# Patient Record
Sex: Male | Born: 1945 | Race: White | Hispanic: No | Marital: Married | Smoking: Current every day smoker
Health system: Southern US, Community
[De-identification: ages and names within clinical notes are randomized; demographics above are authoritative.]

## PROBLEM LIST (undated history)

## (undated) DIAGNOSIS — Z87440 Personal history of urinary (tract) infections: Secondary | ICD-10-CM

## (undated) DIAGNOSIS — R399 Unspecified symptoms and signs involving the genitourinary system: Secondary | ICD-10-CM

## (undated) DIAGNOSIS — F0390 Unspecified dementia without behavioral disturbance: Secondary | ICD-10-CM

## (undated) DIAGNOSIS — Z8673 Personal history of transient ischemic attack (TIA), and cerebral infarction without residual deficits: Secondary | ICD-10-CM

## (undated) DIAGNOSIS — R251 Tremor, unspecified: Secondary | ICD-10-CM

## (undated) DIAGNOSIS — K5909 Other constipation: Secondary | ICD-10-CM

## (undated) DIAGNOSIS — F419 Anxiety disorder, unspecified: Secondary | ICD-10-CM

## (undated) DIAGNOSIS — N210 Calculus in bladder: Secondary | ICD-10-CM

## (undated) DIAGNOSIS — N400 Enlarged prostate without lower urinary tract symptoms: Secondary | ICD-10-CM

## (undated) DIAGNOSIS — Z87448 Personal history of other diseases of urinary system: Secondary | ICD-10-CM

## (undated) DIAGNOSIS — N529 Male erectile dysfunction, unspecified: Secondary | ICD-10-CM

## (undated) DIAGNOSIS — R531 Weakness: Secondary | ICD-10-CM

## (undated) DIAGNOSIS — E785 Hyperlipidemia, unspecified: Secondary | ICD-10-CM

## (undated) DIAGNOSIS — G238 Other specified degenerative diseases of basal ganglia: Secondary | ICD-10-CM

## (undated) DIAGNOSIS — F32A Depression, unspecified: Secondary | ICD-10-CM

## (undated) DIAGNOSIS — F329 Major depressive disorder, single episode, unspecified: Secondary | ICD-10-CM

## (undated) DIAGNOSIS — F319 Bipolar disorder, unspecified: Secondary | ICD-10-CM

## (undated) DIAGNOSIS — Z85828 Personal history of other malignant neoplasm of skin: Secondary | ICD-10-CM

## (undated) DIAGNOSIS — F209 Schizophrenia, unspecified: Secondary | ICD-10-CM

## (undated) DIAGNOSIS — E538 Deficiency of other specified B group vitamins: Secondary | ICD-10-CM

## (undated) DIAGNOSIS — I10 Essential (primary) hypertension: Secondary | ICD-10-CM

## (undated) DIAGNOSIS — R972 Elevated prostate specific antigen [PSA]: Secondary | ICD-10-CM

## (undated) HISTORY — DX: Tremor, unspecified: R25.1

## (undated) HISTORY — DX: Major depressive disorder, single episode, unspecified: F32.9

## (undated) HISTORY — DX: Calculus in bladder: N21.0

## (undated) HISTORY — DX: Benign prostatic hyperplasia without lower urinary tract symptoms: N40.0

## (undated) HISTORY — DX: Deficiency of other specified B group vitamins: E53.8

## (undated) HISTORY — PX: CARDIOVASCULAR STRESS TEST: SHX262

## (undated) HISTORY — DX: Bipolar disorder, unspecified: F31.9

## (undated) HISTORY — DX: Depression, unspecified: F32.A

---

## 1999-10-01 ENCOUNTER — Emergency Department (HOSPITAL_COMMUNITY): Admission: EM | Admit: 1999-10-01 | Discharge: 1999-10-01 | Payer: Self-pay | Admitting: Emergency Medicine

## 2000-09-25 ENCOUNTER — Emergency Department (HOSPITAL_COMMUNITY): Admission: EM | Admit: 2000-09-25 | Discharge: 2000-09-26 | Payer: Self-pay

## 2000-09-29 ENCOUNTER — Emergency Department (HOSPITAL_COMMUNITY): Admission: EM | Admit: 2000-09-29 | Discharge: 2000-09-29 | Payer: Self-pay | Admitting: Emergency Medicine

## 2000-10-10 ENCOUNTER — Encounter: Payer: Self-pay | Admitting: Gastroenterology

## 2000-10-10 ENCOUNTER — Ambulatory Visit (HOSPITAL_COMMUNITY): Admission: RE | Admit: 2000-10-10 | Discharge: 2000-10-10 | Payer: Self-pay | Admitting: Gastroenterology

## 2000-10-10 ENCOUNTER — Encounter (INDEPENDENT_AMBULATORY_CARE_PROVIDER_SITE_OTHER): Payer: Self-pay | Admitting: Specialist

## 2000-11-01 ENCOUNTER — Encounter: Admission: RE | Admit: 2000-11-01 | Discharge: 2000-11-01 | Payer: Self-pay | Admitting: General Surgery

## 2000-11-01 ENCOUNTER — Encounter: Payer: Self-pay | Admitting: General Surgery

## 2000-11-02 ENCOUNTER — Ambulatory Visit (HOSPITAL_BASED_OUTPATIENT_CLINIC_OR_DEPARTMENT_OTHER): Admission: RE | Admit: 2000-11-02 | Discharge: 2000-11-02 | Payer: Self-pay | Admitting: General Surgery

## 2000-11-02 ENCOUNTER — Encounter (INDEPENDENT_AMBULATORY_CARE_PROVIDER_SITE_OTHER): Payer: Self-pay | Admitting: *Deleted

## 2000-11-02 HISTORY — PX: OTHER SURGICAL HISTORY: SHX169

## 2000-12-31 ENCOUNTER — Emergency Department (HOSPITAL_COMMUNITY): Admission: EM | Admit: 2000-12-31 | Discharge: 2000-12-31 | Payer: Self-pay

## 2001-10-05 ENCOUNTER — Emergency Department (HOSPITAL_COMMUNITY): Admission: EM | Admit: 2001-10-05 | Discharge: 2001-10-05 | Payer: Self-pay

## 2001-11-02 ENCOUNTER — Encounter: Payer: Self-pay | Admitting: Emergency Medicine

## 2001-11-02 ENCOUNTER — Inpatient Hospital Stay (HOSPITAL_COMMUNITY): Admission: EM | Admit: 2001-11-02 | Discharge: 2001-11-05 | Payer: Self-pay | Admitting: Emergency Medicine

## 2001-11-03 ENCOUNTER — Encounter: Payer: Self-pay | Admitting: Internal Medicine

## 2001-12-11 ENCOUNTER — Emergency Department (HOSPITAL_COMMUNITY): Admission: EM | Admit: 2001-12-11 | Discharge: 2001-12-11 | Payer: Self-pay | Admitting: Emergency Medicine

## 2004-10-20 ENCOUNTER — Emergency Department (HOSPITAL_COMMUNITY): Admission: EM | Admit: 2004-10-20 | Discharge: 2004-10-21 | Payer: Self-pay | Admitting: Emergency Medicine

## 2005-01-10 ENCOUNTER — Emergency Department (HOSPITAL_COMMUNITY): Admission: EM | Admit: 2005-01-10 | Discharge: 2005-01-10 | Payer: Self-pay | Admitting: Emergency Medicine

## 2007-08-29 ENCOUNTER — Emergency Department (HOSPITAL_COMMUNITY): Admission: EM | Admit: 2007-08-29 | Discharge: 2007-08-29 | Payer: Self-pay | Admitting: Emergency Medicine

## 2007-10-22 ENCOUNTER — Emergency Department (HOSPITAL_COMMUNITY): Admission: EM | Admit: 2007-10-22 | Discharge: 2007-10-22 | Payer: Self-pay | Admitting: Emergency Medicine

## 2008-02-18 ENCOUNTER — Emergency Department (HOSPITAL_COMMUNITY): Admission: EM | Admit: 2008-02-18 | Discharge: 2008-02-19 | Payer: Self-pay | Admitting: Emergency Medicine

## 2008-12-24 ENCOUNTER — Emergency Department (HOSPITAL_BASED_OUTPATIENT_CLINIC_OR_DEPARTMENT_OTHER): Admission: EM | Admit: 2008-12-24 | Discharge: 2008-12-24 | Payer: Self-pay | Admitting: Emergency Medicine

## 2009-02-03 ENCOUNTER — Encounter: Admission: RE | Admit: 2009-02-03 | Discharge: 2009-02-03 | Payer: Self-pay | Admitting: Internal Medicine

## 2009-03-05 ENCOUNTER — Emergency Department (HOSPITAL_COMMUNITY): Admission: EM | Admit: 2009-03-05 | Discharge: 2009-03-05 | Payer: Self-pay | Admitting: *Deleted

## 2009-03-18 ENCOUNTER — Encounter: Admission: RE | Admit: 2009-03-18 | Discharge: 2009-03-18 | Payer: Self-pay | Admitting: Family Medicine

## 2009-07-01 ENCOUNTER — Emergency Department (HOSPITAL_COMMUNITY): Admission: EM | Admit: 2009-07-01 | Discharge: 2009-07-01 | Payer: Self-pay | Admitting: Emergency Medicine

## 2009-12-06 ENCOUNTER — Observation Stay (HOSPITAL_COMMUNITY): Admission: EM | Admit: 2009-12-06 | Discharge: 2009-12-09 | Payer: Self-pay | Admitting: Emergency Medicine

## 2009-12-07 ENCOUNTER — Encounter (INDEPENDENT_AMBULATORY_CARE_PROVIDER_SITE_OTHER): Payer: Self-pay | Admitting: Internal Medicine

## 2009-12-08 ENCOUNTER — Encounter (INDEPENDENT_AMBULATORY_CARE_PROVIDER_SITE_OTHER): Payer: Self-pay | Admitting: Internal Medicine

## 2010-03-24 ENCOUNTER — Inpatient Hospital Stay (HOSPITAL_COMMUNITY): Admission: EM | Admit: 2010-03-24 | Discharge: 2010-03-29 | Payer: Self-pay | Admitting: Internal Medicine

## 2010-03-24 ENCOUNTER — Encounter: Payer: Self-pay | Admitting: Emergency Medicine

## 2010-03-24 ENCOUNTER — Ambulatory Visit: Payer: Self-pay | Admitting: Diagnostic Radiology

## 2010-03-24 ENCOUNTER — Ambulatory Visit: Payer: Self-pay | Admitting: Cardiology

## 2010-03-25 ENCOUNTER — Encounter (INDEPENDENT_AMBULATORY_CARE_PROVIDER_SITE_OTHER): Payer: Self-pay | Admitting: Internal Medicine

## 2010-10-20 ENCOUNTER — Ambulatory Visit: Payer: Self-pay | Admitting: Diagnostic Radiology

## 2010-10-20 ENCOUNTER — Emergency Department (HOSPITAL_BASED_OUTPATIENT_CLINIC_OR_DEPARTMENT_OTHER): Admission: EM | Admit: 2010-10-20 | Discharge: 2010-10-20 | Payer: Self-pay | Admitting: Emergency Medicine

## 2010-11-07 ENCOUNTER — Emergency Department (HOSPITAL_BASED_OUTPATIENT_CLINIC_OR_DEPARTMENT_OTHER)
Admission: EM | Admit: 2010-11-07 | Discharge: 2010-11-07 | Payer: Self-pay | Source: Home / Self Care | Admitting: Emergency Medicine

## 2010-12-04 HISTORY — PX: OTHER SURGICAL HISTORY: SHX169

## 2011-02-14 ENCOUNTER — Emergency Department (HOSPITAL_BASED_OUTPATIENT_CLINIC_OR_DEPARTMENT_OTHER)
Admission: EM | Admit: 2011-02-14 | Discharge: 2011-02-14 | Disposition: A | Discharge status: Home / Self Care | Payer: MEDICARE | Attending: Emergency Medicine | Admitting: Emergency Medicine

## 2011-02-14 DIAGNOSIS — R112 Nausea with vomiting, unspecified: Secondary | ICD-10-CM | POA: Insufficient documentation

## 2011-02-14 DIAGNOSIS — F17200 Nicotine dependence, unspecified, uncomplicated: Secondary | ICD-10-CM | POA: Insufficient documentation

## 2011-02-14 DIAGNOSIS — R197 Diarrhea, unspecified: Secondary | ICD-10-CM | POA: Insufficient documentation

## 2011-02-14 DIAGNOSIS — E860 Dehydration: Secondary | ICD-10-CM | POA: Insufficient documentation

## 2011-02-14 DIAGNOSIS — F319 Bipolar disorder, unspecified: Secondary | ICD-10-CM | POA: Insufficient documentation

## 2011-02-14 DIAGNOSIS — Z79899 Other long term (current) drug therapy: Secondary | ICD-10-CM | POA: Insufficient documentation

## 2011-02-14 DIAGNOSIS — I10 Essential (primary) hypertension: Secondary | ICD-10-CM | POA: Insufficient documentation

## 2011-02-14 DIAGNOSIS — N390 Urinary tract infection, site not specified: Secondary | ICD-10-CM | POA: Insufficient documentation

## 2011-02-14 DIAGNOSIS — K5289 Other specified noninfective gastroenteritis and colitis: Secondary | ICD-10-CM | POA: Insufficient documentation

## 2011-02-14 LAB — DIFFERENTIAL
Basophils Absolute: 0.3 10*3/uL — ABNORMAL HIGH (ref 0.0–0.1)
Basophils Relative: 1 % (ref 0–1)
Basophils Relative: 3 % — ABNORMAL HIGH (ref 0–1)
Eosinophils Absolute: 0 10*3/uL (ref 0.0–0.7)
Eosinophils Relative: 1 % (ref 0–5)
Eosinophils Relative: 1 % (ref 0–5)
Eosinophils Relative: 1 % (ref 0–5)
Lymphocytes Relative: 4 % — ABNORMAL LOW (ref 12–46)
Lymphs Abs: 0.6 10*3/uL — ABNORMAL LOW (ref 0.7–4.0)
Lymphs Abs: 1.1 10*3/uL (ref 0.7–4.0)
Monocytes Absolute: 0.8 10*3/uL (ref 0.1–1.0)
Monocytes Absolute: 0.5 10*3/uL (ref 0.1–1.0)
Monocytes Relative: 4 % (ref 3–12)
Monocytes Relative: 4 % (ref 3–12)
Neutro Abs: 14.4 10*3/uL — ABNORMAL HIGH (ref 1.7–7.7)
Neutro Abs: 8.6 10*3/uL — ABNORMAL HIGH (ref 1.7–7.7)
Neutrophils Relative %: 82 % — ABNORMAL HIGH (ref 43–77)

## 2011-02-14 LAB — COMPREHENSIVE METABOLIC PANEL
ALT: 13 U/L (ref 0–53)
ALT: 19 U/L (ref 0–53)
AST: 24 U/L (ref 0–37)
AST: 27 U/L (ref 0–37)
Albumin: 4.5 g/dL (ref 3.5–5.2)
Alkaline Phosphatase: 121 U/L — ABNORMAL HIGH (ref 39–117)
Alkaline Phosphatase: 128 U/L — ABNORMAL HIGH (ref 39–117)
CO2: 25 mEq/L (ref 19–32)
Chloride: 106 mEq/L (ref 96–112)
GFR calc Af Amer: >60 mL/min (ref >60)
GFR calc Af Amer: >60 mL/min (ref >60)
GFR calc non Af Amer: >60 mL/min (ref >60)
Glucose, Bld: 118 mg/dL — ABNORMAL HIGH (ref 70–99)
Potassium: 4.5 mEq/L (ref 3.5–5.1)
Potassium: 4.5 mEq/L (ref 3.5–5.1)
Sodium: 142 mEq/L (ref 135–145)
Sodium: 144 mEq/L (ref 135–145)
Total Bilirubin: 0.9 mg/dL (ref 0.3–1.2)
Total Protein: 7.7 g/dL (ref 6.0–8.3)
Total Protein: 7.8 g/dL (ref 6.0–8.3)

## 2011-02-14 LAB — URINALYSIS, ROUTINE W REFLEX MICROSCOPIC
Bilirubin Urine: NEGATIVE
Bilirubin Urine: NEGATIVE
Glucose, UA: 100 mg/dL — AB
Glucose, UA: NEGATIVE mg/dL
Glucose, UA: NEGATIVE mg/dL
Hgb urine dipstick: NEGATIVE
Ketones, ur: 15 mg/dL — AB
Leukocytes, UA: NEGATIVE
Nitrite: NEGATIVE
Protein, ur: 30 mg/dL — AB
Specific Gravity, Urine: 1.02 (ref 1.005–1.030)
Specific Gravity, Urine: 1.015 (ref 1.005–1.030)
Urobilinogen, UA: 0.2 mg/dL (ref 0.0–1.0)
pH: 6 (ref 5.0–8.0)
pH: 6.5 (ref 5.0–8.0)

## 2011-02-14 LAB — CBC
HCT: 47.3 % (ref 39.0–52.0)
HCT: 48.3 % (ref 39.0–52.0)
Hemoglobin: 17.1 g/dL — ABNORMAL HIGH (ref 13.0–17.0)
Hemoglobin: 17.6 g/dL — ABNORMAL HIGH (ref 13.0–17.0)
MCHC: 36.4 g/dL — ABNORMAL HIGH (ref 30.0–36.0)
MCV: 86.4 fL (ref 78.0–100.0)
MCV: 92.1 fL (ref 78.0–100.0)
Platelets: 211 10*3/uL (ref 150–400)
RBC: 5.15 MIL/uL (ref 4.22–5.81)
RDW: 12.7 % (ref 11.5–15.5)
RDW: 12.1 % (ref 11.5–15.5)
WBC: 10.4 10*3/uL (ref 4.0–10.5)
WBC: 15.9 10*3/uL — ABNORMAL HIGH (ref 4.0–10.5)
WBC: 8.8 10*3/uL (ref 4.0–10.5)

## 2011-02-14 LAB — HEPATIC FUNCTION PANEL
ALT: 23 U/L (ref 0–53)
Alkaline Phosphatase: 147 U/L — ABNORMAL HIGH (ref 39–117)
Indirect Bilirubin: 1.1 mg/dL — ABNORMAL HIGH (ref 0.3–0.9)
Total Bilirubin: 1.1 mg/dL (ref 0.3–1.2)
Total Protein: 7.7 g/dL (ref 6.0–8.3)

## 2011-02-14 LAB — BASIC METABOLIC PANEL
BUN: 13 mg/dL (ref 6–23)
Calcium: 9.5 mg/dL (ref 8.4–10.5)
Chloride: 105 mEq/L (ref 96–112)
Creatinine, Ser: 1 mg/dL (ref 0.4–1.5)
GFR calc Af Amer: >60 mL/min (ref >60)

## 2011-02-14 LAB — URINE MICROSCOPIC-ADD ON

## 2011-02-15 LAB — URINE CULTURE: Culture  Setup Time: 201203130614

## 2011-02-18 LAB — LIPID PANEL
HDL: 41 mg/dL (ref >39)
Total CHOL/HDL Ratio: 5 RATIO

## 2011-02-18 LAB — CBC
HCT: 46.5 % (ref 39.0–52.0)
Hemoglobin: 15.5 g/dL (ref 13.0–17.0)
Hemoglobin: 15.8 g/dL (ref 13.0–17.0)
MCV: 94.8 fL (ref 78.0–100.0)
RBC: 4.84 MIL/uL (ref 4.22–5.81)
RDW: 13.1 % (ref 11.5–15.5)
WBC: 6.8 10*3/uL (ref 4.0–10.5)
WBC: 7.3 10*3/uL (ref 4.0–10.5)

## 2011-02-18 LAB — BASIC METABOLIC PANEL
Calcium: 8.5 mg/dL (ref 8.4–10.5)
GFR calc Af Amer: >60 mL/min (ref >60)
GFR calc non Af Amer: >60 mL/min (ref >60)
Glucose, Bld: 94 mg/dL (ref 70–99)
Sodium: 139 mEq/L (ref 135–145)

## 2011-02-18 LAB — COMPREHENSIVE METABOLIC PANEL
Alkaline Phosphatase: 94 U/L (ref 39–117)
BUN: 11 mg/dL (ref 6–23)
Chloride: 105 mEq/L (ref 96–112)
Creatinine, Ser: 0.86 mg/dL (ref 0.4–1.5)
GFR calc non Af Amer: >60 mL/min (ref >60)
Glucose, Bld: 100 mg/dL — ABNORMAL HIGH (ref 70–99)
Potassium: 3.9 mEq/L (ref 3.5–5.1)
Total Bilirubin: 0.8 mg/dL (ref 0.3–1.2)

## 2011-02-18 LAB — RAPID URINE DRUG SCREEN, HOSP PERFORMED
Barbiturates: NOT DETECTED
Cocaine: NOT DETECTED

## 2011-02-18 LAB — AMMONIA: Ammonia: 23 umol/L (ref 11–35)

## 2011-02-18 LAB — DIFFERENTIAL
Basophils Absolute: 0 10*3/uL (ref 0.0–0.1)
Basophils Relative: 1 % (ref 0–1)
Neutro Abs: 5 10*3/uL (ref 1.7–7.7)
Neutrophils Relative %: 68 % (ref 43–77)

## 2011-02-18 LAB — HEMOGLOBIN A1C: Mean Plasma Glucose: 114 mg/dL

## 2011-02-18 LAB — URINALYSIS, ROUTINE W REFLEX MICROSCOPIC
Ketones, ur: NEGATIVE mg/dL
Nitrite: NEGATIVE
Protein, ur: NEGATIVE mg/dL
pH: 7 (ref 5.0–8.0)

## 2011-02-18 LAB — TSH: TSH: 2.974 u[IU]/mL (ref 0.350–4.500)

## 2011-02-18 LAB — ETHANOL: Alcohol, Ethyl (B): <5 mg/dL (ref 0–10)

## 2011-02-18 LAB — PROTIME-INR: Prothrombin Time: 13.4 seconds (ref 11.6–15.2)

## 2011-02-21 LAB — URINALYSIS, ROUTINE W REFLEX MICROSCOPIC
Bilirubin Urine: NEGATIVE
Ketones, ur: NEGATIVE mg/dL
Nitrite: NEGATIVE
Specific Gravity, Urine: 1.014 (ref 1.005–1.030)
Urobilinogen, UA: 1 mg/dL (ref 0.0–1.0)
pH: 6 (ref 5.0–8.0)

## 2011-02-21 LAB — RPR: RPR Ser Ql: NONREACTIVE

## 2011-02-21 LAB — POCT CARDIAC MARKERS
CKMB, poc: <1 ng/mL — ABNORMAL LOW (ref 1.0–8.0)
Myoglobin, poc: 95.4 ng/mL (ref 12–200)
Troponin i, poc: <0.05 ng/mL (ref 0.00–0.09)

## 2011-02-21 LAB — VITAMIN B12: Vitamin B-12: 199 pg/mL — ABNORMAL LOW (ref 211–911)

## 2011-02-21 LAB — DIFFERENTIAL
Basophils Absolute: 0.1 10*3/uL (ref 0.0–0.1)
Lymphocytes Relative: 19 % (ref 12–46)
Lymphs Abs: 1.4 10*3/uL (ref 0.7–4.0)
Neutro Abs: 5.4 10*3/uL (ref 1.7–7.7)

## 2011-02-21 LAB — CBC
Platelets: 198 10*3/uL (ref 150–400)
RDW: 12.6 % (ref 11.5–15.5)
WBC: 7.7 10*3/uL (ref 4.0–10.5)

## 2011-02-21 LAB — BASIC METABOLIC PANEL
BUN: 14 mg/dL (ref 6–23)
Calcium: 8.8 mg/dL (ref 8.4–10.5)
Calcium: 9.2 mg/dL (ref 8.4–10.5)
GFR calc non Af Amer: >60 mL/min (ref >60)
GFR calc non Af Amer: >60 mL/min (ref >60)
Glucose, Bld: 92 mg/dL (ref 70–99)
Glucose, Bld: 115 mg/dL — ABNORMAL HIGH (ref 70–99)
Potassium: 4 mEq/L (ref 3.5–5.1)
Sodium: 142 mEq/L (ref 135–145)
Sodium: 143 mEq/L (ref 135–145)

## 2011-02-21 LAB — POCT TOXICOLOGY PANEL

## 2011-02-21 LAB — FOLATE: Folate: 10.9 ng/mL

## 2011-03-15 LAB — DIFFERENTIAL
Eosinophils Absolute: 0.1 10*3/uL (ref 0.0–0.7)
Lymphs Abs: 1 10*3/uL (ref 0.7–4.0)
Monocytes Relative: 7 % (ref 3–12)
Neutro Abs: 6.7 10*3/uL (ref 1.7–7.7)
Neutrophils Relative %: 80 % — ABNORMAL HIGH (ref 43–77)

## 2011-03-15 LAB — URINALYSIS, ROUTINE W REFLEX MICROSCOPIC
Glucose, UA: NEGATIVE mg/dL
Ketones, ur: NEGATIVE mg/dL
Leukocytes, UA: NEGATIVE
pH: 7 (ref 5.0–8.0)

## 2011-03-15 LAB — CBC
MCV: 93.1 fL (ref 78.0–100.0)
RBC: 5.04 MIL/uL (ref 4.22–5.81)
WBC: 8.4 10*3/uL (ref 4.0–10.5)

## 2011-03-15 LAB — POCT I-STAT, CHEM 8
Chloride: 100 mEq/L (ref 96–112)
Creatinine, Ser: 0.8 mg/dL (ref 0.4–1.5)
Glucose, Bld: 164 mg/dL — ABNORMAL HIGH (ref 70–99)
Potassium: 2.9 mEq/L — ABNORMAL LOW (ref 3.5–5.1)

## 2011-03-15 LAB — RAPID URINE DRUG SCREEN, HOSP PERFORMED
Barbiturates: NOT DETECTED
Cocaine: NOT DETECTED
Opiates: NOT DETECTED

## 2011-04-21 NOTE — Op Note (Signed)
[place]. [facility]  Patient:    [name], [name]                      MRN: [id number] Proc. Date: 11/02/00 Adm. Date:  [id number] Disc. Date: [id number] Attending:  Charna Elizabeth CC:         Anselmo Rod, M.D.  Angelena Sole, M.D. [facility]   Operative Report  PREOPERATIVE DIAGNOSIS:  Rectal polyps.  POSTOPERATIVE DIAGNOSIS:  Rectal polyps.  PROCEDURE:  Transanal excision of anal polyp and rectal polyp.  SURGEON:  Adolph Pollack, M.D.  ANESTHESIA:  General plus 0.5% Marcaine with epinephrine for perianal block.  INDICATION:  This is a 65 year old male who had some rectal bleeding and underwent colonoscopy.  He had some polyps biopsied in the rectum but at the dentate line had a very firm polyp that was unable to be biopsied adequately. Because it was worrisome-looking, he presents now for a transanal excision.  DESCRIPTION OF PROCEDURE:  He was placed supine on the operating table and given a general anesthetic.  He was then placed in the lithotomy position.  A digital rectal exam was performed, and the firm area was felt approximately 2-3 cm from the anal verge.  Perianal area was sterilely prepped and draped. A rectal retractor was used and the area which was 5 oclock on the left side was located.  First I encountered an anal polyp, which I excised and sent for pathology at the 5 oclock position, and this was labeled as #1.  The second lesion I was able to palpate but could see less well.  I grasped the round lesion with an Allis clamp and excised it with the cautery.  I then brought it to the back table and identified it, a dark blue to black-looking firm, 2-3 mm lesion in the rectal mucosa.  I marked this with a suture, then sent it to pathology.  I then reapproximated the mucosa with a running locking 3-0 chromic suture. This provided for adequate closure of the mucosa as well as adequate hemostasis.  I placed a piece of Gelfoam into the  rectum, then placed a bulky dressing onto it.  He tolerated the procedure well without any apparent complications, and he was taken to the recovery room in satisfactory condition. DD:  11/02/00 TD:  11/02/00 Job: [id number]

## 2011-04-21 NOTE — H&P (Signed)
[facility]  Patient:    Dennis, NOVOSEL Visit Number: [id number] MRN: [id number]          Service Type: MED Location: [phone] Attending Physician:  [name] Dictated by:   [name], P.A.-C. Admit Date:  11/02/2001   CC:         [name], M.D. [facility]  [name], M.D.   History and Physical  CHIEF COMPLAINT:  Forty-eight hours of nausea, epigastric discomfort, and headache.  HISTORY:  [name] is a 65 year old white male known to Dr. Loreta Love and also to Dr. Ruthine Love with history of chronic severe anxiety and depression from which he is disabled. He has a history of colon and rectal polyps as well. At this time, he presents with a two- to three-day history of persistent nausea without vomiting, epigastric discomfort, and "queasiness". He complains of posterior headache which is not severe, not associated with photophobia or visual disturbances and is similar to his usual "headaches". He has not had any associated fever or chills. He did have some mild diarrhea, which he attributes to Pepto-Bismol, which has since resolved. The patient initially reported that he has not felt well since he had a colonoscopy last year in November at which time he had rectal polyps. He also had resection of an anal polyp per Dr. Adolph Pollack thereafter. He has no complaints of weight loss. Again, he is disabled from chronic anxiety and depression over the past two years and says that he feels that his current symptoms may be stress induced as he has had a lot of chronic stress. He says that he usually gets headaches two to three times per week, again, which he feels are stress-induced headaches. He usually takes Tylenol for these. He generally does not have nausea associated, however. He is on no chronic aspirin or NSAIDs and has not had formal neuro evaluation for his headaches. At this time, he was seen and evaluated in the emergency room, had  workup per the ER physician which was negative including labs and CT of the abdomen and pelvis and is admitted at this time for hydration, antiemetics, and further diagnostic evaluation.  CURRENT MEDICATIONS: 1. Effexor 150 mg q.a.m. 2. Valium 5 mg p.o. q.i.d. 3. Seroquel 200 mg two p.o. h.s. 4. Ziac 2.5 mg p.o. q.d.  DRUG ALLERGIES:  No known drug allergies.  PAST HISTORY: 1. Pertinent for rectal polyps and excision of anal polyp per Dr. Abbey Chatters    with colonoscopy in November of 2001 and surgical excision done December of    2001. 2. He also has had tooth extractions. 3. Chronic anxiety and depression.  SOCIAL HISTORY:  The patient is married. He is a nonsmoker, nondrinker. He is currently disabled.  FAMILY HISTORY:  Mother, 55, alive and well. Father, 28, alive and well. He had two sisters, one with polio. There is no family history of GI disease that he is aware of.  REVIEW OF SYSTEMS:  CARDIOVASCULAR: Denies any chest pain or anginal symptoms. PULMONARY: Negative for cough, shortness of breath, or sputum production. GENITOURINARY: Denies any dysuria, urgency, or frequency. GI: As above.  PHYSICAL EXAMINATION:  GENERAL:  Well-developed white male in no acute distress.  VITAL SIGNS:  Blood pressure is 146/100, pulse is 79, respirations 14, temperature of 98.4.  HEENT:  Atraumatic, normocephalic. EOMI. PERRLA. Sclerae anicteric.  NECK:  Supple.  CARDIOVASCULAR:  Regular rate and rhythm with S1 And S2. No murmur, rub, or gallop.  PULMONARY:  Clear to A&P.  ABDOMEN:  Fairly tense with voluntary guarding. Bowel sounds are active. There is no CVA tenderness. No palpable hepatosplenomegaly. He is mildly tender in the epigastrium.  RECTAL:  Yellow, heme-negative stool.  EXTREMITIES:  Without clubbing, cyanosis, or edema.  LABORATORIES:  Labs are reviewed and are unremarkable with negative UA with the exception of trace leukocytes. Lipase was 20. WBC 9.0,  hemoglobin 15.6, hematocrit of 43.8, MCV of 90, platelets 305,000. Electrolytes within normal limits. Glucose 123. Liver function studies within normal limits. Amylase of 35.  CT scan of the abdomen and pelvis was negative and abdominal ultrasound did not show any evidence of gallstones.  EKG:  Normal sinus rhythm.  IMPRESSION: 91. A 65 year old white male with persistent nausea, dehydration, low grade    headache without significant laboratory abnormalities. Rule out viral    illness, rule out gastritis versus peptic ulcer disease, though feel less    likely. 2. History of chronic disabling anxiety and depression. 3. Chronic headaches. 4. History of colon polyps.  PLAN:  The patient is admitted to the service of Dr. Lina Sar for symptomatic treatment with IV fluids, bowel rest, antiemetics. Will cover him with Protonix. Continue his usual regimen of Valium, Seroquel, and Effexor. He will given Demerol and Phenergan as needed for nausea and pain and add Librax for intestinal spasm. Will consider upper endoscopy depending on his course. ictated by:   [name], P.A.-C. Attending Physician:  [name] DD:  11/03/01 TD:  11/03/01 Job: [id number]

## 2011-04-21 NOTE — Procedures (Signed)
[place]. [facility]  Patient:    [name], [name]                      MRN: [id number] Proc. Date: 10/10/00 Adm. Date:  [id number] Attending:  Charna Elizabeth CC:         Kern Reap, M.D.  Adolph Pollack, M.D.   Procedure Report  DATE OF BIRTH:  1946/05/10  REFERRING PHYSICIAN:  Kern Reap, M.D.  PROCEDURE PERFORMED:  Colonoscopy with snare polypectomy x 3 and biopsies.  ENDOSCOPIST:  Anselmo Rod, M.D.  INSTRUMENT USED:  Olympus video colonoscope.  INDICATIONS FOR PROCEDURE:  Rectal bleeding and recent change in bowel habits with weight loss in a 65 year old white male, rule out colonic polyps, masses, hemorrhoids etc.  There is a palpable polyp versus a hemorrhoid on rectal exam.  PREPROCEDURE PREPARATION:  Informed consent was procured from the patient. The patient was fasted for eight hours prior to the procedure and prepped with a bottle of magnesium citrate and a gallon of NuLytely the night prior to the procedure.  PREPROCEDURE PHYSICAL:  The patient had stable vital signs.  Neck supple. Chest clear to auscultation.  S1, S2 regular.  Abdomen soft with normal abdominal bowel sounds.  DESCRIPTION OF PROCEDURE:  The patient was placed in the left lateral decubitus position and sedated with 100 mg of Demerol and 10 mg of Versed intravenously.  Once the patient was adequately sedated and maintained on low-flow oxygen and continuous cardiac monitoring, the Olympus video colonoscope was advanced from the rectum to the cecum without difficulty.  The patient had a fairly good prep.  A large pedunculated polyp was snared from 40 cm after injecting the base of the polyp with 2 cc of epinephrine and two smaller polyps measuring about 5 to 6 mm that were sessile in nature were snared from the rectum.  There was a mass at the anal verge better appreciated on retroflexion that was "very hard to biopsy."  Multiple biopsies  were attempted but the forceps seemed to slip over the surface of the mass.  There was also some ulceration visible around the mass in the rectum.  IMPRESSION: 1. Large pedunculated polyp snared from 40 cm. 2. Two smaller polyps snared from the rectum. 3. Mass at the anal verge biopsied for pathology.  Good sampling not possible    because of the mass being "hard to biopsy."  RECOMMENDATIONS: 1. Await pathology results. 2. Surgical evaluation by Dr. Avel Peace to rebiopsy this mass under    general anesthesia. 3. Basic labs today including a CEA level. 4. CT scan of abdomen and pelvis. 5. Outpatient follow-up within the next week.DD:  10/10/00 TD:  10/10/00 Job: [id number]

## 2011-04-21 NOTE — Discharge Summary (Signed)
[facility]  Patient:    [name], [name] Visit Number: [id number] MRN: [id number]          Service Type: MED Location: [phone] Attending Physician:  Mervin Hack Dictated by:   Anselmo Rod, M.D. Admit Date:  11/02/2001 Discharge Date: 11/05/2001   CC:         Bruce H. Swords, M.D. [facility]   Discharge Summary  ADMITTING DIAGNOSES: 1. Intractable nausea and vomiting with dehydration. 2. Headache. 3. Chronic disabling anxiety and depression. 4. Chronic headache. 5. History of colonic polyps.  DISCHARGE DIAGNOSES: 1. Severe anxiety disorder. 2. Depression. 3. Chronic headaches. 4. History of colonic polyps.  HOSPITAL COURSE:  Mr. [name] is a 65 year old, white male admitted to [facility] on November 02, 2001, with the above-mentioned complaints. The patient had headaches and the first CT scan of the head was done which showed no evidence of abnormality.  An abdominal ultrasound showed gallbladder cholecystitis without evidence of acute cholecystitis.  The patient has been maintained on a liquid diet along with Phenergan pain medications.  He gradually improved.  Prior to discharge, he was seen by Dr. Birdie Sons in consultation for his headache as he has Dr. Evonnie Dawes as his primary care Garvis Downum.  He was advised to follow up in the office for further workup after discharge.  Labs during admission were essentially normal with normal LFTs.  Urinalysis was normal as well.  DISCHARGE MEDICATIONS: 1. Vicodin p.r.n. 2. Phenergan p.r.n.  FOLLOWUP:  The patient was advised to see me in the office in the next two to three weeks or earlier if need be.  Follow up with Dr. Birdie Sons at [facility] to be decided by them.Dictated by:   Anselmo Rod, M.D. Attending Physician:  Mervin Hack DD:  11/15/01 TD:  11/15/01 Job: [id number]

## 2011-04-30 ENCOUNTER — Observation Stay (HOSPITAL_COMMUNITY)
Admission: EM | Admit: 2011-04-30 | Discharge: 2011-05-01 | Disposition: A | Discharge status: Home / Self Care | Payer: Medicare Other | Source: Other Acute Inpatient Hospital | Attending: Internal Medicine | Admitting: Internal Medicine

## 2011-04-30 ENCOUNTER — Emergency Department (INDEPENDENT_AMBULATORY_CARE_PROVIDER_SITE_OTHER): Payer: Medicare Other

## 2011-04-30 ENCOUNTER — Emergency Department (HOSPITAL_BASED_OUTPATIENT_CLINIC_OR_DEPARTMENT_OTHER)
Admission: EM | Admit: 2011-04-30 | Discharge: 2011-04-30 | Disposition: A | Discharge status: Other Acute Inpatient Hospital | Payer: Medicare Other | Source: Home / Self Care | Attending: Emergency Medicine | Admitting: Emergency Medicine

## 2011-04-30 DIAGNOSIS — R079 Chest pain, unspecified: Principal | ICD-10-CM | POA: Insufficient documentation

## 2011-04-30 DIAGNOSIS — Z87891 Personal history of nicotine dependence: Secondary | ICD-10-CM

## 2011-04-30 DIAGNOSIS — Z8673 Personal history of transient ischemic attack (TIA), and cerebral infarction without residual deficits: Secondary | ICD-10-CM | POA: Insufficient documentation

## 2011-04-30 DIAGNOSIS — I10 Essential (primary) hypertension: Secondary | ICD-10-CM | POA: Insufficient documentation

## 2011-04-30 DIAGNOSIS — F17200 Nicotine dependence, unspecified, uncomplicated: Secondary | ICD-10-CM | POA: Insufficient documentation

## 2011-04-30 DIAGNOSIS — K7689 Other specified diseases of liver: Secondary | ICD-10-CM | POA: Insufficient documentation

## 2011-04-30 DIAGNOSIS — F3289 Other specified depressive episodes: Secondary | ICD-10-CM | POA: Insufficient documentation

## 2011-04-30 DIAGNOSIS — E785 Hyperlipidemia, unspecified: Secondary | ICD-10-CM | POA: Insufficient documentation

## 2011-04-30 DIAGNOSIS — F319 Bipolar disorder, unspecified: Secondary | ICD-10-CM | POA: Insufficient documentation

## 2011-04-30 DIAGNOSIS — F205 Residual schizophrenia: Secondary | ICD-10-CM | POA: Insufficient documentation

## 2011-04-30 DIAGNOSIS — R0602 Shortness of breath: Secondary | ICD-10-CM

## 2011-04-30 DIAGNOSIS — K219 Gastro-esophageal reflux disease without esophagitis: Secondary | ICD-10-CM | POA: Insufficient documentation

## 2011-04-30 DIAGNOSIS — F329 Major depressive disorder, single episode, unspecified: Secondary | ICD-10-CM | POA: Insufficient documentation

## 2011-04-30 LAB — CBC
HCT: 44 % (ref 39.0–52.0)
Hemoglobin: 15.7 g/dL (ref 13.0–17.0)
MCH: 31.2 pg (ref 26.0–34.0)
MCHC: 35.7 g/dL (ref 30.0–36.0)
MCV: 87.3 fL (ref 78.0–100.0)
Platelets: 218 10*3/uL (ref 150–400)
RBC: 5.04 MIL/uL (ref 4.22–5.81)
RDW: 12.9 % (ref 11.5–15.5)
WBC: 8.1 10*3/uL (ref 4.0–10.5)

## 2011-04-30 LAB — LIPID PANEL
Cholesterol: 182 mg/dL (ref 0–200)
HDL: 39 mg/dL — ABNORMAL LOW (ref >39)
LDL Cholesterol: 131 mg/dL — ABNORMAL HIGH (ref 0–99)
Total CHOL/HDL Ratio: 4.7 RATIO
Triglycerides: 61 mg/dL (ref <150)
VLDL: 12 mg/dL (ref 0–40)

## 2011-04-30 LAB — CARDIAC PANEL(CRET KIN+CKTOT+MB+TROPI)
CK, MB: 2 ng/mL (ref 0.3–4.0)
CK, MB: 1.5 ng/mL (ref 0.3–4.0)
Relative Index: INVALID (ref 0.0–2.5)
Relative Index: INVALID (ref 0.0–2.5)
Total CK: 90 U/L (ref 7–232)
Total CK: 60 U/L (ref 7–232)
Troponin I: <0.3 ng/mL (ref <0.30)
Troponin I: <0.3 ng/mL (ref <0.30)
Troponin I: <0.3 ng/mL (ref <0.30)

## 2011-04-30 LAB — BASIC METABOLIC PANEL
BUN: 13 mg/dL (ref 6–23)
CO2: 26 mEq/L (ref 19–32)
Calcium: 9.4 mg/dL (ref 8.4–10.5)
Chloride: 104 mEq/L (ref 96–112)
Creatinine, Ser: 0.7 mg/dL (ref 0.4–1.5)
GFR calc Af Amer: >60 mL/min (ref >60)
GFR calc non Af Amer: >60 mL/min (ref >60)
Glucose, Bld: 111 mg/dL — ABNORMAL HIGH (ref 70–99)
Potassium: 3.6 mEq/L (ref 3.5–5.1)
Sodium: 141 mEq/L (ref 135–145)

## 2011-04-30 LAB — D-DIMER, QUANTITATIVE: D-Dimer, Quant: <0.22 ug/mL-FEU (ref 0.00–0.48)

## 2011-04-30 LAB — DIFFERENTIAL
Eosinophils Relative: 4 % (ref 0–5)
Lymphocytes Relative: 28 % (ref 12–46)
Lymphs Abs: 2.2 10*3/uL (ref 0.7–4.0)
Monocytes Absolute: 0.7 10*3/uL (ref 0.1–1.0)
Monocytes Relative: 8 % (ref 3–12)

## 2011-04-30 LAB — CK TOTAL AND CKMB (NOT AT ARMC)
CK, MB: 2.6 ng/mL (ref 0.3–4.0)
Relative Index: 2.1 (ref 0.0–2.5)
Total CK: 124 U/L (ref 7–232)

## 2011-04-30 LAB — TROPONIN I: Troponin I: <0.3 ng/mL (ref <0.30)

## 2011-05-01 LAB — LIPID PANEL: LDL Cholesterol: 117 mg/dL — ABNORMAL HIGH (ref 0–99)

## 2011-05-01 LAB — TSH: TSH: 2.523 u[IU]/mL (ref 0.350–4.500)

## 2011-05-02 NOTE — H&P (Signed)
[name], [name]               ACCOUNT NO.:  [id number]  MEDICAL RECORD NO.:  [id number]           PATIENT TYPE:  I  LOCATION:  2031                         FACILITY:  [facility]  PHYSICIAN:  Michiel Cowboy, MDDATE OF BIRTH:  [birth year]  DATE OF ADMISSION:  04/30/2011 DATE OF DISCHARGE:                             HISTORY & PHYSICAL   PRIMARY CARE PROVIDER:  Dr. Jeannetta Nap  CHIEF COMPLAINT:  Chest pain.  The patient is a 65 year old gentleman with past medical history of schizophrenia and hypertension.  The patient was resting tonight when he developed chest pain.  He cannot quite qualify how it was felt like.  It was maybe aching behind his left breast and did radiate to his neck, although not his arm.  This lasted until he presented to the emergency department when it improved.  He did notice it seems like when he laid down in bed.  It was catching him a little until more than when he was standing up, but otherwise there was nothing that exacerbated it.  It was nonexertional.  At this point, he is chest pain free.  REVIEW OF SYSTEMS:  No shortness of breath associated with it.  No pleuritic component.  No diaphoresis, no nausea, vomiting, or constipation.  No fevers, no chills.  Otherwise, review of systems is negative, 10 systems reviewed.  PAST MEDICAL HISTORY:  Significant for: 1. Schizophrenia. 2. Hypertension. 3. The patient does have history of B12 deficiency. 4. Depression. 5. Tobacco abuse. 6. Hyperlipidemia. 7. History of left basal ganglia lacunar infarct. 8. Fatty liver. 9. Reflux.  MEDICATIONS:  He takes: 1. Cardura 2 mg every morning. 2. Aspirin 81 mg daily. 3. Xanax 1 mg 4 times a day as needed.  He is not allergic to any medication.  FAMILY HISTORY:  No history of diabetes or coronary artery disease.  SOCIAL HISTORY:  The patient denies drinking.  He smokes occasionally.  PHYSICAL EXAMINATION:  VITAL SIGNS:  Temperature 97.6, blood  pressures 137/88, pulse 67, respirations 25, and 95% on room air. GENERAL:  The patient appears to be in no acute distress.  He has flat affect. HEENT:  Head is nontraumatic.  Moist mucous membranes. LUNGS:  Clear to auscultation bilaterally. HEART:  Regular rate and rhythm.  No murmurs appreciated. ABDOMEN:  Soft, nontender, and nondistended. EXTREMITIES:  Lower extremities without clubbing, cyanosis, or edema. NEUROLOGICAL:  Grossly intact.  On palpation, he does have some reproducible component to his pain.  LABORATORY DATA:  White blood cell count 8.1, hemoglobin 15.7.  Sodium 141, potassium 3.6, creatinine 0.7.  D-dimer unremarkable.  Cardiac enzymes unremarkable.  Chest x-ray unremarkable.  EKG showing T-wave inversions in lead III, aVF, and V1.  Of note, from EKG in 2007, he did have T-wave flattening in lead III and normal T-waves in aVF and some T- wave inversion in V1.  There is a slight change from prior EKG, but not significant.  ASSESSMENT/PLAN:  This is a 65 year old gentleman with risks factors of hypertension with chest pain which is very atypical.  We will admit to Telemetry. 1. Chest pain.  We will cycle cardiac markers,  obtain serial EKG,     check fasting lipid panel and hemoglobin A1c.  He is at this point     not on any statin.  This needs to be proceeded to see if he still     needs this and not given a history of hyperlipidemia in the past.     We will also continue his home dose of aspirin. 2. History of hypertension.  We will continue Cardura. 3. Prophylaxis.  Protonix and Lovenox. 4. History of schizophrenia.  The patient states that he does not want     to take any medications, has not seen a psychiatrist for long time.     At this point, he appears to be appropriate and did not endorse any     active hallucinations.     Michiel Cowboy, MD     AVD/MEDQ  D:  04/30/2011  T:  04/30/2011  Job:  [id number]  cc:   [name],  M.D.  Electronically Signed by Therisa Doyne MD on 05/02/2011 06:00:21 AM

## 2011-05-08 NOTE — Discharge Summary (Signed)
  [name], [name]               ACCOUNT NO.:  [id number]  MEDICAL RECORD NO.:  [id number]           PATIENT TYPE:  I  LOCATION:  2031                         FACILITY:  [facility]  PHYSICIAN:  Conley Canal, MD      DATE OF BIRTH:  1946/01/13  DATE OF ADMISSION:  04/30/2011 DATE OF DISCHARGE:  05/01/2011                        DISCHARGE SUMMARY - REFERRING   PRIMARY CARE PROVIDER:  Dr. Jeannetta Nap.  DISCHARGE DIAGNOSES: 1. Chest pain, most likely secondary to acute costochondritis     myocardial infarction ruled out by serial cardiac enzymes, PE ruled     out by negative D-dimer. 2. Chronic schizophrenia. 3. Essential hypertension. 4. Depression. 5. History of vitamin B12 deficiency. 6. Tobacco habituation, hyperlipidemia, history of left basal ganglia     lacunar infarct, fatty infiltration of the liver, gastroesophageal     reflux disease.  DISCHARGE MEDICATIONS: 1. Cardura 2 mg daily. 2. Xanax 1 mg q.6 hourly p.r.n. 3. Aspirin 81 mg daily.  PROCEDURES PERFORMED:  Chest x-ray on 05/27 showed no acute cardiopulmonary process.  HOSPITAL COURSE:  Mr. [name] was admitted with complaints of poorly- characterized left-sided chest pain, which was exacerbated by chest wall movement.  The pain was not associated with any nausea, vomiting, or diaphoresis.  The patient had serial cardiac enzymes negative x3, D- dimer was also negative.  EKG showed sinus rhythm with premature atrial complexes.  At the time of discharge, the patient is chest pain free.  I instructed him to follow with Dr. Jeannetta Nap later this week to have an outpatient stress test set up by Dr. Jeannetta Nap.  If stress test negative, we would consider other etiologies including gastric-related pain.  He is discharged in a stable condition.  I discussed plan of care with the patient and his supportive wife who is at the bedside.  The time spent for this discharge preparation is less than 30 minutes.    Conley Canal,  MD    SR/MEDQ  D:  05/01/2011  T:  05/01/2011  Job:  [id number]  cc:   Dr. Jeannetta Nap  Electronically Signed by Conley Canal  on 05/08/2011 05:40:11 PM

## 2011-06-26 ENCOUNTER — Encounter: Payer: Self-pay | Admitting: *Deleted

## 2011-06-26 ENCOUNTER — Emergency Department (HOSPITAL_BASED_OUTPATIENT_CLINIC_OR_DEPARTMENT_OTHER)
Admission: EM | Admit: 2011-06-26 | Discharge: 2011-06-26 | Disposition: A | Discharge status: Home / Self Care | Payer: Medicare Other | Attending: Emergency Medicine | Admitting: Emergency Medicine

## 2011-06-26 DIAGNOSIS — F319 Bipolar disorder, unspecified: Secondary | ICD-10-CM | POA: Insufficient documentation

## 2011-06-26 DIAGNOSIS — I10 Essential (primary) hypertension: Secondary | ICD-10-CM | POA: Insufficient documentation

## 2011-06-26 DIAGNOSIS — R51 Headache: Secondary | ICD-10-CM | POA: Insufficient documentation

## 2011-06-26 DIAGNOSIS — F0390 Unspecified dementia without behavioral disturbance: Secondary | ICD-10-CM | POA: Insufficient documentation

## 2011-06-26 HISTORY — DX: Unspecified dementia, unspecified severity, without behavioral disturbance, psychotic disturbance, mood disturbance, and anxiety: F03.90

## 2011-06-26 HISTORY — DX: Essential (primary) hypertension: I10

## 2011-06-26 LAB — BASIC METABOLIC PANEL
BUN: 10 mg/dL (ref 6–23)
CO2: 27 mEq/L (ref 19–32)
Chloride: 102 mEq/L (ref 96–112)
Creatinine, Ser: 0.7 mg/dL (ref 0.50–1.35)
Potassium: 3.8 mEq/L (ref 3.5–5.1)

## 2011-06-26 LAB — URINALYSIS, ROUTINE W REFLEX MICROSCOPIC
Bilirubin Urine: NEGATIVE
Glucose, UA: NEGATIVE mg/dL
Ketones, ur: NEGATIVE mg/dL
Leukocytes, UA: NEGATIVE
Nitrite: NEGATIVE
Specific Gravity, Urine: 1.018 (ref 1.005–1.030)
pH: 7 (ref 5.0–8.0)

## 2011-06-26 LAB — CBC
HCT: 44.9 % (ref 39.0–52.0)
Hemoglobin: 16.2 g/dL (ref 13.0–17.0)
MCV: 86.2 fL (ref 78.0–100.0)
RBC: 5.21 MIL/uL (ref 4.22–5.81)
WBC: 8.3 10*3/uL (ref 4.0–10.5)

## 2011-06-26 MED ORDER — DIPHENHYDRAMINE HCL 50 MG/ML IJ SOLN
25.0000 mg | Freq: Once | INTRAMUSCULAR | Status: AC
  Administered 2011-06-26: 25 mg via INTRAVENOUS
  Filled 2011-06-26: qty 1

## 2011-06-26 MED ORDER — METOCLOPRAMIDE HCL 5 MG/ML IJ SOLN
10.0000 mg | Freq: Once | INTRAMUSCULAR | Status: AC
Start: 2011-06-26 — End: 2011-06-26
  Administered 2011-06-26: 10 mg via INTRAVENOUS
  Filled 2011-06-26: qty 2

## 2011-06-26 MED ORDER — SODIUM CHLORIDE 0.9 % IV SOLN
Freq: Once | INTRAVENOUS | Status: AC
  Administered 2011-06-26: 06:00:00 via INTRAVENOUS

## 2011-06-26 MED ORDER — KETOROLAC TROMETHAMINE 30 MG/ML IJ SOLN
INTRAMUSCULAR | Status: AC
  Filled 2011-06-26: qty 1

## 2011-06-26 MED ORDER — SODIUM CHLORIDE 0.9 % IV SOLN
INTRAVENOUS | Status: DC
  Administered 2011-06-26: 05:00:00 via INTRAVENOUS

## 2011-06-26 MED ORDER — KETOROLAC TROMETHAMINE 30 MG/ML IJ SOLN
30.0000 mg | Freq: Once | INTRAMUSCULAR | Status: AC
  Administered 2011-06-26: 30 mg via INTRAVENOUS

## 2011-06-26 MED ORDER — ONDANSETRON HCL 4 MG/2ML IJ SOLN
4.0000 mg | Freq: Once | INTRAMUSCULAR | Status: AC
  Administered 2011-06-26: 4 mg via INTRAVENOUS
  Filled 2011-06-26: qty 2

## 2011-06-26 NOTE — ED Provider Notes (Addendum)
History     Chief Complaint  Patient presents with  . Headache   HPI This is a 65 year old male with a history of headache that started yesterday and came on gradually. He states he gets headaches like this was blood pressure is elevated. His blood pressure 164/94 is high for him he states. The headache is located on the left side of the head is throbbing and moderate to severe in intensity. There is no associated visual changes or focal neurologic changes. There is nausea without vomiting. He has had frequent urination for about a month. He has some mild suprapubic pain he has no pain with urination. He has had no fever or chills. He has had some general malaise.  Past Medical History  Diagnosis Date  . Hypertension   . Dementia   . Bipolar 1 disorder   . Schizoaffective disorder     History reviewed. No pertinent past surgical history.  History reviewed. No pertinent family history.  History  Substance Use Topics  . Smoking status: Never Smoker   . Smokeless tobacco: Not on file  . Alcohol Use: No      Review of Systems  All other systems reviewed and are negative.    Physical Exam  BP 164/94  Pulse 68  Temp(Src) 98 F (36.7 C) (Oral)  Resp 20  Ht 6' (1.829 m)  Wt 205 lb (92.987 kg)  BMI 27.80 kg/m2  SpO2 96%  Physical Exam General: Well-developed, well-nourished male in no acute distress; appearance consistent with age of record HENT: normocephalic, atraumatic Eyes: pupils equal round and reactive to light; extraocular muscles intact Neck: supple Heart: regular rate and rhythm; no murmurs, rubs or gallops Lungs: clear to auscultation bilaterally Abdomen: soft; nontender; nondistended; no masses or hepatosplenomegaly; bowel sounds present GU: Normal external genitalia, uncircumcised; prostate nontender Extremities: No deformity; full range of motion; pulses normal Neurologic: Awake, alert and oriented;motor function intact in all extremities and  symmetric;sensation grossly intact; no facial droop; normal coordination without pronator drift, passpointing, or ataxia. Skin: Warm and dry Psychiatric: Normal mood and affect   ED Course  Procedures  MDM Nursing notes and vitals signs, including pulse oximetry, reviewed.  Summary of this visit's results, reviewed by myself:  Labs:  Results for orders placed during the hospital encounter of 06/26/11  CBC      Component Value Range   WBC 8.3  4.0 - 10.5 (K/uL)   RBC 5.21  4.22 - 5.81 (MIL/uL)   Hemoglobin 16.2  13.0 - 17.0 (g/dL)   HCT 40.9  81.1 - 91.4 (%)   MCV 86.2  78.0 - 100.0 (fL)   MCH 31.1  26.0 - 34.0 (pg)   MCHC 36.1 (*) 30.0 - 36.0 (g/dL)   RDW 78.2  95.6 - 21.3 (%)   Platelets 208  150 - 400 (K/uL)  BASIC METABOLIC PANEL      Component Value Range   Sodium 138  135 - 145 (mEq/L)   Potassium 3.8  3.5 - 5.1 (mEq/L)   Chloride 102  96 - 112 (mEq/L)   CO2 27  19 - 32 (mEq/L)   Glucose, Bld 101 (*) 70 - 99 (mg/dL)   BUN 10  6 - 23 (mg/dL)   Creatinine, Ser 0.86  0.50 - 1.35 (mg/dL)   Calcium 9.1  8.4 - 57.8 (mg/dL)   GFR calc non Af Amer >60  >60 (mL/min)   GFR calc Af Amer >60  >60 (mL/min)  URINALYSIS, ROUTINE W REFLEX MICROSCOPIC  Component Value Range   Color, Urine YELLOW  YELLOW    Appearance CLOUDY (*) CLEAR    Specific Gravity, Urine 1.018  1.005 - 1.030    pH 7.0  5.0 - 8.0    Glucose, UA NEGATIVE  NEGATIVE (mg/dL)   Hgb urine dipstick NEGATIVE  NEGATIVE    Bilirubin Urine NEGATIVE  NEGATIVE    Ketones, ur NEGATIVE  NEGATIVE (mg/dL)   Protein, ur NEGATIVE  NEGATIVE (mg/dL)   Urobilinogen, UA 1.0  0.0 - 1.0 (mg/dL)   Nitrite NEGATIVE  NEGATIVE    Leukocytes, UA NEGATIVE  NEGATIVE     Imaging Studies: No results found.  6:49 AM The patient has not received any relief from his Reglan, Benadryl or Zofran. We will give IV Toradol and reassess. 7:23 AM Headache is resolved. Patient is feeling better and wishes to go home.  Hanley Seamen,  MD 06/26/11 [id number]  Hanley Seamen, MD 07/27/11 1914

## 2011-06-26 NOTE — ED Notes (Signed)
Pt has had frequency x 1 week. Yesterday began having H/A, felt like BP was up and was nauseated.

## 2012-01-31 ENCOUNTER — Encounter (HOSPITAL_COMMUNITY): Payer: Self-pay | Admitting: Emergency Medicine

## 2012-01-31 ENCOUNTER — Observation Stay (HOSPITAL_COMMUNITY)
Admission: EM | Admit: 2012-01-31 | Discharge: 2012-02-03 | Disposition: A | Discharge status: Home / Self Care | Payer: Medicare Other | Attending: Internal Medicine | Admitting: Internal Medicine

## 2012-01-31 DIAGNOSIS — R079 Chest pain, unspecified: Secondary | ICD-10-CM | POA: Diagnosis present

## 2012-01-31 DIAGNOSIS — N400 Enlarged prostate without lower urinary tract symptoms: Secondary | ICD-10-CM | POA: Diagnosis present

## 2012-01-31 DIAGNOSIS — Z8673 Personal history of transient ischemic attack (TIA), and cerebral infarction without residual deficits: Secondary | ICD-10-CM | POA: Insufficient documentation

## 2012-01-31 DIAGNOSIS — E785 Hyperlipidemia, unspecified: Secondary | ICD-10-CM | POA: Insufficient documentation

## 2012-01-31 DIAGNOSIS — E538 Deficiency of other specified B group vitamins: Secondary | ICD-10-CM

## 2012-01-31 DIAGNOSIS — F0390 Unspecified dementia without behavioral disturbance: Secondary | ICD-10-CM | POA: Diagnosis present

## 2012-01-31 DIAGNOSIS — F17200 Nicotine dependence, unspecified, uncomplicated: Secondary | ICD-10-CM | POA: Insufficient documentation

## 2012-01-31 DIAGNOSIS — F209 Schizophrenia, unspecified: Secondary | ICD-10-CM | POA: Insufficient documentation

## 2012-01-31 DIAGNOSIS — I10 Essential (primary) hypertension: Secondary | ICD-10-CM | POA: Insufficient documentation

## 2012-01-31 DIAGNOSIS — R0789 Other chest pain: Principal | ICD-10-CM | POA: Insufficient documentation

## 2012-01-31 HISTORY — DX: Hyperlipidemia, unspecified: E78.5

## 2012-01-31 HISTORY — DX: Schizophrenia, unspecified: F20.9

## 2012-01-31 MED ORDER — ASPIRIN 81 MG PO CHEW
324.0000 mg | CHEWABLE_TABLET | Freq: Once | ORAL | Status: AC
  Administered 2012-02-01: 324 mg via ORAL
  Filled 2012-01-31: qty 4

## 2012-01-31 MED ORDER — MORPHINE SULFATE 2 MG/ML IJ SOLN
2.0000 mg | INTRAMUSCULAR | Status: AC | PRN
  Administered 2012-02-01 (×2): 2 mg via INTRAVENOUS
  Filled 2012-01-31 (×2): qty 1

## 2012-01-31 MED ORDER — NITROGLYCERIN 0.4 MG SL SUBL
0.4000 mg | SUBLINGUAL_TABLET | SUBLINGUAL | Status: AC | PRN
  Administered 2012-02-01 (×3): 0.4 mg via SUBLINGUAL
  Filled 2012-01-31: qty 25

## 2012-01-31 MED ORDER — SODIUM CHLORIDE 0.9 % IV SOLN
INTRAVENOUS | Status: DC
  Administered 2012-02-01: 75 mL/h via INTRAVENOUS

## 2012-01-31 NOTE — ED Notes (Signed)
Patient with chest pain that started earlier today.  Patient states that he does have some dizziness with the chest pain. Patient having neck pain associated with chest pain.

## 2012-02-01 ENCOUNTER — Other Ambulatory Visit: Payer: Self-pay

## 2012-02-01 ENCOUNTER — Emergency Department (HOSPITAL_COMMUNITY): Payer: Medicare Other

## 2012-02-01 ENCOUNTER — Encounter (HOSPITAL_COMMUNITY): Payer: Self-pay | Admitting: Internal Medicine

## 2012-02-01 DIAGNOSIS — F0390 Unspecified dementia without behavioral disturbance: Secondary | ICD-10-CM | POA: Diagnosis present

## 2012-02-01 DIAGNOSIS — E785 Hyperlipidemia, unspecified: Secondary | ICD-10-CM | POA: Insufficient documentation

## 2012-02-01 DIAGNOSIS — R079 Chest pain, unspecified: Secondary | ICD-10-CM | POA: Diagnosis present

## 2012-02-01 DIAGNOSIS — F209 Schizophrenia, unspecified: Secondary | ICD-10-CM | POA: Insufficient documentation

## 2012-02-01 DIAGNOSIS — N400 Enlarged prostate without lower urinary tract symptoms: Secondary | ICD-10-CM | POA: Diagnosis present

## 2012-02-01 DIAGNOSIS — E538 Deficiency of other specified B group vitamins: Secondary | ICD-10-CM

## 2012-02-01 DIAGNOSIS — I10 Essential (primary) hypertension: Secondary | ICD-10-CM | POA: Insufficient documentation

## 2012-02-01 LAB — COMPREHENSIVE METABOLIC PANEL
Albumin: 3.8 g/dL (ref 3.5–5.2)
Alkaline Phosphatase: 96 U/L (ref 39–117)
BUN: 10 mg/dL (ref 6–23)
Creatinine, Ser: 0.85 mg/dL (ref 0.50–1.35)
GFR calc Af Amer: >90 mL/min (ref >90)
Glucose, Bld: 140 mg/dL — ABNORMAL HIGH (ref 70–99)
Potassium: 3.4 mEq/L — ABNORMAL LOW (ref 3.5–5.1)
Total Bilirubin: 0.7 mg/dL (ref 0.3–1.2)
Total Protein: 6.5 g/dL (ref 6.0–8.3)

## 2012-02-01 LAB — VITAMIN B12: Vitamin B-12: 450 pg/mL (ref 211–911)

## 2012-02-01 LAB — DIFFERENTIAL
Basophils Relative: 0 % (ref 0–1)
Eosinophils Absolute: 0.1 10*3/uL (ref 0.0–0.7)
Lymphs Abs: 1.7 10*3/uL (ref 0.7–4.0)
Monocytes Absolute: 0.8 10*3/uL (ref 0.1–1.0)
Monocytes Relative: 10 % (ref 3–12)
Neutrophils Relative %: 69 % (ref 43–77)

## 2012-02-01 LAB — URINE MICROSCOPIC-ADD ON

## 2012-02-01 LAB — CARDIAC PANEL(CRET KIN+CKTOT+MB+TROPI)
CK, MB: 3.3 ng/mL (ref 0.3–4.0)
CK, MB: 2.6 ng/mL (ref 0.3–4.0)
Relative Index: 2.7 — ABNORMAL HIGH (ref 0.0–2.5)
Relative Index: 3 — ABNORMAL HIGH (ref 0.0–2.5)
Troponin I: <0.3 ng/mL (ref <0.30)
Troponin I: <0.3 ng/mL (ref <0.30)

## 2012-02-01 LAB — CBC
HCT: 43.5 % (ref 39.0–52.0)
Hemoglobin: 15.5 g/dL (ref 13.0–17.0)
MCH: 31.5 pg (ref 26.0–34.0)
MCHC: 35.6 g/dL (ref 30.0–36.0)
MCV: 88.4 fL (ref 78.0–100.0)
RBC: 4.92 MIL/uL (ref 4.22–5.81)

## 2012-02-01 LAB — LIPID PANEL
HDL: 37 mg/dL — ABNORMAL LOW (ref >39)
LDL Cholesterol: 122 mg/dL — ABNORMAL HIGH (ref 0–99)
Total CHOL/HDL Ratio: 4.7 RATIO
Triglycerides: 70 mg/dL (ref <150)

## 2012-02-01 LAB — URINALYSIS, ROUTINE W REFLEX MICROSCOPIC
Bilirubin Urine: NEGATIVE
Nitrite: NEGATIVE
Protein, ur: NEGATIVE mg/dL
Urobilinogen, UA: 1 mg/dL (ref 0.0–1.0)

## 2012-02-01 LAB — MRSA PCR SCREENING: MRSA by PCR: NEGATIVE

## 2012-02-01 LAB — TROPONIN I: Troponin I: <0.3 ng/mL (ref <0.30)

## 2012-02-01 LAB — LIPASE, BLOOD: Lipase: 25 U/L (ref 11–59)

## 2012-02-01 MED ORDER — ALPRAZOLAM 0.25 MG PO TABS
0.5000 mg | ORAL_TABLET | Freq: Four times a day (QID) | ORAL | Status: DC | PRN
  Administered 2012-02-01 – 2012-02-03 (×7): 0.5 mg via ORAL
  Filled 2012-02-01: qty 2
  Filled 2012-02-01: qty 1
  Filled 2012-02-01 (×5): qty 2
  Filled 2012-02-01: qty 1
  Filled 2012-02-01: qty 2

## 2012-02-01 MED ORDER — ALPRAZOLAM ER 0.5 MG PO TB24: 0.5000 mg | ORAL_TABLET | Freq: Four times a day (QID) | ORAL | Status: DC | PRN

## 2012-02-01 MED ORDER — LISINOPRIL 20 MG PO TABS
20.0000 mg | ORAL_TABLET | Freq: Every day | ORAL | Status: DC
  Administered 2012-02-01 – 2012-02-03 (×3): 20 mg via ORAL
  Filled 2012-02-01 (×3): qty 1

## 2012-02-01 MED ORDER — ONDANSETRON HCL 4 MG/2ML IJ SOLN: 4.0000 mg | Freq: Four times a day (QID) | INTRAMUSCULAR | Status: DC | PRN

## 2012-02-01 MED ORDER — ASPIRIN EC 325 MG PO TBEC
325.0000 mg | DELAYED_RELEASE_TABLET | Freq: Every day | ORAL | Status: DC
  Administered 2012-02-01 – 2012-02-03 (×3): 325 mg via ORAL
  Filled 2012-02-01 (×3): qty 1

## 2012-02-01 MED ORDER — ONDANSETRON HCL 4 MG PO TABS: 4.0000 mg | ORAL_TABLET | Freq: Four times a day (QID) | ORAL | Status: DC | PRN

## 2012-02-01 MED ORDER — ACETAMINOPHEN 650 MG RE SUPP: 650.0000 mg | Freq: Four times a day (QID) | RECTAL | Status: DC | PRN

## 2012-02-01 MED ORDER — NITROGLYCERIN 0.4 MG SL SUBL: 0.4000 mg | SUBLINGUAL_TABLET | SUBLINGUAL | Status: DC | PRN

## 2012-02-01 MED ORDER — SODIUM CHLORIDE 0.9 % IJ SOLN
3.0000 mL | Freq: Two times a day (BID) | INTRAMUSCULAR | Status: DC
  Administered 2012-02-01: 09:00:00 3 mL via INTRAVENOUS

## 2012-02-01 MED ORDER — POTASSIUM CHLORIDE CRYS ER 20 MEQ PO TBCR
40.0000 meq | EXTENDED_RELEASE_TABLET | Freq: Once | ORAL | Status: AC
  Administered 2012-02-01: 40 meq via ORAL
  Filled 2012-02-01: qty 2

## 2012-02-01 MED ORDER — DONEPEZIL HCL 10 MG PO TABS
10.0000 mg | ORAL_TABLET | ORAL | Status: DC
  Administered 2012-02-01 – 2012-02-03 (×3): 10 mg via ORAL
  Filled 2012-02-01 (×4): qty 1

## 2012-02-01 MED ORDER — TAMSULOSIN HCL 0.4 MG PO CAPS
0.4000 mg | ORAL_CAPSULE | Freq: Two times a day (BID) | ORAL | Status: DC
  Administered 2012-02-01 – 2012-02-03 (×5): 0.4 mg via ORAL
  Filled 2012-02-01 (×6): qty 1

## 2012-02-01 MED ORDER — FESOTERODINE FUMARATE ER 8 MG PO TB24
8.0000 mg | ORAL_TABLET | Freq: Every day | ORAL | Status: DC
Start: 2012-02-01 — End: 2012-02-03
  Administered 2012-02-01 – 2012-02-03 (×3): 8 mg via ORAL
  Filled 2012-02-01 (×3): qty 1

## 2012-02-01 MED ORDER — OMEGA-3-ACID ETHYL ESTERS 1 G PO CAPS
1.0000 g | ORAL_CAPSULE | Freq: Every day | ORAL | Status: DC
  Administered 2012-02-01 – 2012-02-03 (×3): 1 g via ORAL
  Filled 2012-02-01 (×3): qty 1

## 2012-02-01 MED ORDER — ACETAMINOPHEN 325 MG PO TABS
650.0000 mg | ORAL_TABLET | Freq: Four times a day (QID) | ORAL | Status: DC | PRN
  Administered 2012-02-01 (×2): 650 mg via ORAL
  Filled 2012-02-01: qty 2
  Filled 2012-02-01: qty 1
  Filled 2012-02-01: qty 2

## 2012-02-01 MED ORDER — ATORVASTATIN CALCIUM 10 MG PO TABS
10.0000 mg | ORAL_TABLET | Freq: Every day | ORAL | Status: DC
  Administered 2012-02-01 – 2012-02-02 (×2): 10 mg via ORAL
  Filled 2012-02-01 (×3): qty 1

## 2012-02-01 MED ORDER — SODIUM CHLORIDE 0.9 % IV SOLN
INTRAVENOUS | Status: DC
  Administered 2012-02-01 (×2): via INTRAVENOUS

## 2012-02-01 NOTE — Consult Note (Addendum)
Admit date: 01/31/2012 Name: [name] DOB:  03/07/1946 MRN:  [id number]  Referring Physician:  [facility]  Primary Physician:  Dr. Josetta Huddle  Reason for Consultation:  Chest pain  IMPRESSIONS: 1. Very difficult historian with chest pain that is very difficult to assess with risk factors of hypertension and hyperlipidemia. At this point it is difficult to tell if the chest pain is ischemic or not. 2. Chronic schizophrenia with a history of a psychiatric hospitalization requiring ECT 3. Hypertension 4. Hyperlipidemia 5. History of chronic anxiety and depression 6. BPH 7. History of rectal polyps 8. History of severe B12 deficiency  RECOMMENDATION: I would keep him in-house and perform a stress test on him while he is in-house. He had a previous admission with chest pain but never had an outpatient stress test following that. His EKG is normal. He appears to have a number of psychiatric issues that may not be adequately addressed.  Consider psychiatric evaluation.  Obtain B12 level  HISTORY: This 66 year old male was admitted to the hospital with multiple complaints. This occurred history of schizophrenia and dementia and has been pacing the floor for about 24 hours. He developed left-sided chest pain described as soreness and was brought to the emergency room. He has been quite anxious. EKG was normal now but I did not see one done on admission. His cardiac enzymes have been negative. He was oriented to date he was able to give me a consistent history of that which is in the chart. Is not taking much in the way of psychiatric medications. He is inactive at home and has  significant difficulty with anxiety and depression as well as chronic headaches over the years. I cannot get a definite history of congestive heart failure or PND.  He did have thought to be a small stroke noted on CT scan and MRI but this has been somewhat indeterminate in the past. An echocardiogram  from April 2011 was normal.    Past Medical History  Diagnosis Date  . Hypertension   . Dementia   . Stroke   . Hyperlipidemia   . Schizophrenia       History reviewed. No pertinent past surgical history.  Allergies:   has no known allergies.   Medications: Prior to Admission medications   Medication Sig Start Date End Date Taking? Authorizing Provider  aspirin 81 MG EC tablet Take 81 mg by mouth daily.     Yes Historical Provider, MD  donepezil (ARICEPT) 10 MG tablet Take 10 mg by mouth 1 day or 1 dose.     Yes Historical Provider, MD  Fesoterodine Fumarate (TOVIAZ) 8 MG TB24 Take 8 mg by mouth daily.   Yes Historical Provider, MD  lisinopril (PRINIVIL,ZESTRIL) 40 MG tablet Take 20 mg by mouth daily.     Yes Historical Provider, MD  Omega-3 Fatty Acids (FISH OIL) 1200 MG CAPS Take 1 capsule by mouth 2 (two) times daily.   Yes Historical Provider, MD  Tamsulosin HCl (FLOMAX) 0.4 MG CAPS Take 0.4 mg by mouth 2 (two) times daily.   Yes Historical Provider, MD   Family History: Family History  Problem Relation Age of Onset  . Dementia Father     Social History:   reports that he has been smoking Cigarettes.  He has been smoking about .25 packs per day for the past 0 years. He has quit using smokeless tobacco. He reports that he does not drink alcohol or use illicit drugs.   History  Social History Narrative   Divorced, remarried.  Disabled secondary to psych reasons.    Review of Systems: Psychological ROS: See history of present illness, significant anxiety difficulty sleeping General ROS: Malaise and fatigue, does not get much in the way of regular exercise  Ophthalmic ROS: negative Respiratory ROS: no cough, shortness of breath, or wheezing Gastrointestinal ROS: Significant abdominal pain and some difficulty swallowing Genito-Urinary ROS: complains of symptoms of nocturia as well as difficulty voiding and has seen a urologist Musculoskeletal ROS: Mild arthritis  involving his knees Neurological ROS: Chronic headaches  Physical Exam: Blood pressure 145/88, pulse 62, temperature 98.4 F (36.9 C), temperature source Oral, resp. rate 16, height 6' (1.829 m), weight 93.2 kg (205 lb 7.5 oz), SpO2 97.00%.    General appearance: alert, no distress and mildly obese Head: Normocephalic, without obvious abnormality, atraumatic, Balding male hair pattern Eyes: negative Throat: lips, mucosa, and tongue normal; teeth and gums normal Neck: no adenopathy, no carotid bruit, no JVD and supple, symmetrical, trachea midline Lungs: clear to auscultation bilaterally Heart: regular rate and rhythm, S1, S2 normal, no murmur, click, rub or gallop Abdomen: Soft, mild tenderness in the lower abdomen, no masses noted no bruits or aneurysm felt. Rectal: deferred Pulses: 2+ and symmetric Skin: Skin color, texture, turgor normal. No rashes or lesions Neurologic: Alert and oriented X 3, normal strength and tone. Normal symmetric reflexes. Normal coordination and gait  Labs: Results for orders placed during the hospital encounter of 01/31/12 (from the past 24 hour(s))  CBC     Status: Normal   Collection Time   01/31/12 11:43 PM      Component Value Range   WBC 8.5  4.0 - 10.5 (K/uL)   RBC 4.92  4.22 - 5.81 (MIL/uL)   Hemoglobin 15.5  13.0 - 17.0 (g/dL)   HCT 16.1  09.6 - 04.5 (%)   MCV 88.4  78.0 - 100.0 (fL)   MCH 31.5  26.0 - 34.0 (pg)   MCHC 35.6  30.0 - 36.0 (g/dL)   RDW 40.9  81.1 - 91.4 (%)   Platelets 230  150 - 400 (K/uL)  DIFFERENTIAL     Status: Normal   Collection Time   01/31/12 11:43 PM      Component Value Range   Neutrophils Relative 69  43 - 77 (%)   Neutro Abs 5.9  1.7 - 7.7 (K/uL)   Lymphocytes Relative 20  12 - 46 (%)   Lymphs Abs 1.7  0.7 - 4.0 (K/uL)   Monocytes Relative 10  3 - 12 (%)   Monocytes Absolute 0.8  0.1 - 1.0 (K/uL)   Eosinophils Relative 1  0 - 5 (%)   Eosinophils Absolute 0.1  0.0 - 0.7 (K/uL)   Basophils Relative 0  0 - 1 (%)    Basophils Absolute 0.0  0.0 - 0.1 (K/uL)  COMPREHENSIVE METABOLIC PANEL     Status: Abnormal   Collection Time   01/31/12 11:43 PM      Component Value Range   Sodium 137  135 - 145 (mEq/L)   Potassium 3.4 (*) 3.5 - 5.1 (mEq/L)   Chloride 103  96 - 112 (mEq/L)   CO2 24  19 - 32 (mEq/L)   Glucose, Bld 140 (*) 70 - 99 (mg/dL)   BUN 10  6 - 23 (mg/dL)   Creatinine, Ser 7.82  0.50 - 1.35 (mg/dL)   Calcium 9.5  8.4 - 95.6 (mg/dL)   Total Protein 6.5  6.0 -  8.3 (g/dL)   Albumin 3.8  3.5 - 5.2 (g/dL)   AST 17  0 - 37 (U/L)   ALT 11  0 - 53 (U/L)   Alkaline Phosphatase 96  39 - 117 (U/L)   Total Bilirubin 0.7  0.3 - 1.2 (mg/dL)   GFR calc non Af Amer 89 (*) >90 (mL/min)   GFR calc Af Amer >90  >90 (mL/min)  LIPASE, BLOOD     Status: Normal   Collection Time   01/31/12 11:43 PM      Component Value Range   Lipase 25  11 - 59 (U/L)  TROPONIN I     Status: Normal   Collection Time   01/31/12 11:44 PM      Component Value Range   Troponin I <0.30  <0.30 (ng/mL)  URINALYSIS, ROUTINE W REFLEX MICROSCOPIC     Status: Abnormal   Collection Time   02/01/12 12:57 AM      Component Value Range   Color, Urine YELLOW  YELLOW    APPearance CLEAR  CLEAR    Specific Gravity, Urine 1.025  1.005 - 1.030    pH 6.0  5.0 - 8.0    Glucose, UA NEGATIVE  NEGATIVE (mg/dL)   Hgb urine dipstick TRACE (*) NEGATIVE    Bilirubin Urine NEGATIVE  NEGATIVE    Ketones, ur NEGATIVE  NEGATIVE (mg/dL)   Protein, ur NEGATIVE  NEGATIVE (mg/dL)   Urobilinogen, UA 1.0  0.0 - 1.0 (mg/dL)   Nitrite NEGATIVE  NEGATIVE    Leukocytes, UA TRACE (*) NEGATIVE   URINE MICROSCOPIC-ADD ON     Status: Normal   Collection Time   02/01/12 12:57 AM      Component Value Range   WBC, UA 3-6  <3 (WBC/hpf)   RBC / HPF 0-2  <3 (RBC/hpf)   Bacteria, UA RARE  RARE   LIPID PANEL     Status: Abnormal   Collection Time   02/01/12  4:29 AM      Component Value Range   Cholesterol 173  0 - 200 (mg/dL)   Triglycerides 70  <409 (mg/dL)     HDL 37 (*) >81 (mg/dL)   Total CHOL/HDL Ratio 4.7     VLDL 14  0 - 40 (mg/dL)   LDL Cholesterol 191 (*) 0 - 99 (mg/dL)  D-DIMER, QUANTITATIVE     Status: Normal   Collection Time   02/01/12  4:29 AM      Component Value Range   D-Dimer, Quant 0.27  0.00 - 0.48 (ug/mL-FEU)  MRSA PCR SCREENING     Status: Normal   Collection Time   02/01/12  5:30 AM      Component Value Range   MRSA by PCR NEGATIVE  NEGATIVE   CARDIAC PANEL(CRET KIN+CKTOT+MB+TROPI)     Status: Abnormal   Collection Time   02/01/12  8:00 AM      Component Value Range   Total CK 118  7 - 232 (U/L)   CK, MB 3.2  0.3 - 4.0 (ng/mL)   Troponin I <0.30  <0.30 (ng/mL)   Relative Index 2.7 (*) 0.0 - 2.5       Radiology: Lungs clear, heart size normal, no acute disease  EKG: Normal   Signed:  W. Ashley Royalty MD [facility]   Cardiology Consultant  [phone]   02/01/2012, 11:05 AM

## 2012-02-01 NOTE — Progress Notes (Signed)
Follow-up note for after [facility] admit on 2/28  66yo male with pmh of hypertension, tobacco abuse, schizophrenia and dementia presented to ED with complaints of chest pain. According to wife and pt, pain occurred while walking. Pain in left side of chest radiating to axillae and shoulder described as "soreness". Pt is a very poor historian given dementia. Wife denies any associated diaphoresis, n/v. Pt persisted in ED and resolved some time after receiving 3 SL nitroglycerin. Wife reports pt has not slept well since Sunday and has been up pacing around the house. She is requesting something to help him sleep.  VSS, PE wnl, 1st tropnin is negative. EKG is not available at this time for review but said to be normal in H&P. Current labs show slightly elevated LDL and HDL.  Plan:   Continue cycling CE's  2Decho pending  Given multiple risk factors for CAD and poor history, will ask cardiology to eval for possible stress test (inpt vs outpt). Wife is requesting Dr. Donnie Aho  Add Crestor  Add low-dose Alprazolam  Sitter if wife must leave  Cordelia Pen, NP-C [facility]  pgr [phone]  I have examined the patient and reviewed the chart. I have discussed the plan with Cordelia Pen, MD and agree with the above note.   Calvert Cantor, MD [phone]

## 2012-02-01 NOTE — H&P (Addendum)
[name] is an 66 y.o. male.   PCP - Hayden Rasmussen PA. Chief complain - Chest pain. HPI: 66 year-old male history of hypertension and ongoing tobacco abuse, dementia and previous diagnoses of schizophrenia is off medications for a year now and history of stroke with no residual deficits was brought in the ER as patient was complaining of chest pain since last evening around 3 PM. His wife who is at the bedside stated that he was coming off chest there is a left-sided of his chest and it has been persistent. It is present at rest and on walking. He did not complain of any shortness of breath nausea vomiting abdominal pain dizziness or loss of consciousness. Over the last 2 days patient has been pacing around and very anxious and not eating well. In the ER patient's EKG cardiac enzymes chest x-rays did not show anything acute. But patient on questioning still complains of chest pain. Patient is demented and is unable to characterize the pain exactly and not able to tell if it is radiating or not.  Past Medical History  Diagnosis Date  . Hypertension   . Dementia   . Stroke   . Hyperlipidemia   . Schizophrenia     History reviewed. No pertinent past surgical history.  Family History  Problem Relation Age of Onset  . Dementia Father    Social History:  reports that he has never smoked. He does not have any smokeless tobacco history on file. He reports that he does not drink alcohol or use illicit drugs.  Allergies: No Known Allergies  Medications Prior to Admission  Medication Dose Route Frequency Provider Last Rate Last Dose  . 0.9 %  sodium chloride infusion   Intravenous Continuous Laray Anger, DO 75 mL/hr at 02/01/12 0039 75 mL/hr at 02/01/12 0039  . aspirin chewable tablet 324 mg  324 mg Oral Once Laray Anger, DO   324 mg at 02/01/12 0035  . morphine 2 MG/ML injection 2 mg  2 mg Intravenous Q1H PRN Laray Anger, DO   2 mg at 02/01/12 0242  . nitroGLYCERIN  (NITROSTAT) SL tablet 0.4 mg  0.4 mg Sublingual Q5 min PRN Laray Anger, DO   0.4 mg at 02/01/12 1610   Medications Prior to Admission  Medication Sig Dispense Refill  . aspirin 81 MG EC tablet Take 81 mg by mouth daily.        Marland Kitchen donepezil (ARICEPT) 10 MG tablet Take 10 mg by mouth 1 day or 1 dose.        Marland Kitchen lisinopril (PRINIVIL,ZESTRIL) 40 MG tablet Take 20 mg by mouth daily.          Results for orders placed during the hospital encounter of 01/31/12 (from the past 48 hour(s))  CBC     Status: Normal   Collection Time   01/31/12 11:43 PM      Component Value Range Comment   WBC 8.5  4.0 - 10.5 (K/uL)    RBC 4.92  4.22 - 5.81 (MIL/uL)    Hemoglobin 15.5  13.0 - 17.0 (g/dL)    HCT 96.0  45.4 - 09.8 (%)    MCV 88.4  78.0 - 100.0 (fL)    MCH 31.5  26.0 - 34.0 (pg)    MCHC 35.6  30.0 - 36.0 (g/dL)    RDW 11.9  14.7 - 82.9 (%)    Platelets 230  150 - 400 (K/uL)   DIFFERENTIAL  Status: Normal   Collection Time   01/31/12 11:43 PM      Component Value Range Comment   Neutrophils Relative 69  43 - 77 (%)    Neutro Abs 5.9  1.7 - 7.7 (K/uL)    Lymphocytes Relative 20  12 - 46 (%)    Lymphs Abs 1.7  0.7 - 4.0 (K/uL)    Monocytes Relative 10  3 - 12 (%)    Monocytes Absolute 0.8  0.1 - 1.0 (K/uL)    Eosinophils Relative 1  0 - 5 (%)    Eosinophils Absolute 0.1  0.0 - 0.7 (K/uL)    Basophils Relative 0  0 - 1 (%)    Basophils Absolute 0.0  0.0 - 0.1 (K/uL)   COMPREHENSIVE METABOLIC PANEL     Status: Abnormal   Collection Time   01/31/12 11:43 PM      Component Value Range Comment   Sodium 137  135 - 145 (mEq/L)    Potassium 3.4 (*) 3.5 - 5.1 (mEq/L)    Chloride 103  96 - 112 (mEq/L)    CO2 24  19 - 32 (mEq/L)    Glucose, Bld 140 (*) 70 - 99 (mg/dL)    BUN 10  6 - 23 (mg/dL)    Creatinine, Ser 1.61  0.50 - 1.35 (mg/dL)    Calcium 9.5  8.4 - 10.5 (mg/dL)    Total Protein 6.5  6.0 - 8.3 (g/dL)    Albumin 3.8  3.5 - 5.2 (g/dL)    AST 17  0 - 37 (U/L)    ALT 11  0 - 53 (U/L)     Alkaline Phosphatase 96  39 - 117 (U/L)    Total Bilirubin 0.7  0.3 - 1.2 (mg/dL)    GFR calc non Af Amer 89 (*) >90 (mL/min)    GFR calc Af Amer >90  >90 (mL/min)   LIPASE, BLOOD     Status: Normal   Collection Time   01/31/12 11:43 PM      Component Value Range Comment   Lipase 25  11 - 59 (U/L)   TROPONIN I     Status: Normal   Collection Time   01/31/12 11:44 PM      Component Value Range Comment   Troponin I <0.30  <0.30 (ng/mL)   URINALYSIS, ROUTINE W REFLEX MICROSCOPIC     Status: Abnormal   Collection Time   02/01/12 12:57 AM      Component Value Range Comment   Color, Urine YELLOW  YELLOW     APPearance CLEAR  CLEAR     Specific Gravity, Urine 1.025  1.005 - 1.030     pH 6.0  5.0 - 8.0     Glucose, UA NEGATIVE  NEGATIVE (mg/dL)    Hgb urine dipstick TRACE (*) NEGATIVE     Bilirubin Urine NEGATIVE  NEGATIVE     Ketones, ur NEGATIVE  NEGATIVE (mg/dL)    Protein, ur NEGATIVE  NEGATIVE (mg/dL)    Urobilinogen, UA 1.0  0.0 - 1.0 (mg/dL)    Nitrite NEGATIVE  NEGATIVE     Leukocytes, UA TRACE (*) NEGATIVE    URINE MICROSCOPIC-ADD ON     Status: Normal   Collection Time   02/01/12 12:57 AM      Component Value Range Comment   WBC, UA 3-6  <3 (WBC/hpf)    RBC / HPF 0-2  <3 (RBC/hpf)    Bacteria, UA RARE  RARE  Dg Chest 2 View  02/01/2012  *RADIOLOGY REPORT*  Clinical Data: Shortness of breath, cough and weakness.  CHEST - 2 VIEW  Comparison: 04/30/2011  Findings: Two views of the chest demonstrate clear lungs. Heart and mediastinum are within normal limits.  Bony structures are intact. No evidence for a pneumothorax.  IMPRESSION: No acute cardiopulmonary disease.  Original Report Authenticated By: Richarda Overlie, M.D.    Review of Systems  Constitutional: Negative.   Eyes: Negative.   Respiratory: Negative.   Cardiovascular: Positive for chest pain.  Gastrointestinal: Negative.   Genitourinary: Negative.   Musculoskeletal: Negative.   Neurological: Negative.     Endo/Heme/Allergies: Negative.   Psychiatric/Behavioral: Negative.     Blood pressure 105/64, pulse 78, temperature 98.1 F (36.7 C), temperature source Oral, resp. rate 22, SpO2 94.00%. Physical Exam  Constitutional: He appears well-developed and well-nourished. No distress.  HENT:  Head: Normocephalic and atraumatic.  Right Ear: External ear normal.  Left Ear: External ear normal.  Nose: Nose normal.  Mouth/Throat: Oropharynx is clear and moist. No oropharyngeal exudate.  Eyes: Conjunctivae are normal. Pupils are equal, round, and reactive to light. Right eye exhibits no discharge. Left eye exhibits no discharge. No scleral icterus.  Neck: Normal range of motion. Neck supple.  Cardiovascular: Normal rate, regular rhythm and normal heart sounds.   Respiratory: Effort normal and breath sounds normal. No respiratory distress. He has no wheezes.  GI: Soft. Bowel sounds are normal. He exhibits no distension. There is no tenderness. There is no rebound.  Musculoskeletal: Normal range of motion. He exhibits no edema.  Neurological: He is alert.       Oriented to his name is oriented to place and he recognizes his wife he knows his primary care physicians name and he knows the year but not the month. Most upper and lower extremities 5/5. Follows commands.  Skin: Skin is warm and dry. No rash noted. He is not diaphoretic. No erythema.  Psychiatric: His behavior is normal.     Assessment/Plan #1. Chest pain will rule out ACS - cyclic markers patient will be placed on aspirin. Get 2-D echo.Check D-Dimer. #2. History of hypertension - continue present medications. #3. History of dementia - continue Aricept. #4. History of schizophrenia and is off medications now for last one year - patient's wife states he's been pacing around the last 2 days. Presently he is not having any delusions or psychosis. We'll observe. #5. History of BPH - presently his urine shows some leukocyte esterase positive  with few WBC. His nonsymptomatic. We will check urine cultures.  CODE STATUS - full code.  Remo Kirschenmann N. 02/01/2012, 3:59 AM

## 2012-02-01 NOTE — ED Provider Notes (Signed)
History     CSN: [id number]  Arrival date & time 01/31/12  2254   First MD Initiated Contact with Patient 01/31/12 2318      Chief Complaint  Patient presents with  . Chest Pain     The history is provided by the patient and the spouse. History Limited By: hx dementia, schizophrenia.   Pt was seen at 2335.  Per pt and family, c/o gradual onset and persistence of intermittent left sided chest "pain" since this afternoon.  Pt describes the pain as "sore," radiates into his left neck and arm.  Has been assoc with SOB and lightheadedness.  Pt also c/o gradual onset and persistence of constant poor PO intake for the past several days.  Denies abd pain, no N/V/D, no back pain, no fevers, no cough.  PMD:  Jeannetta Nap Past Medical History  Diagnosis Date  . Hypertension   . Dementia   . Stroke   . Hyperlipidemia   . Schizophrenia     History reviewed. No pertinent past surgical history.    History  Substance Use Topics  . Smoking status: Never Smoker   . Smokeless tobacco: Not on file  . Alcohol Use: No      Review of Systems  Unable to perform ROS: Dementia    Allergies  Review of patient's allergies indicates no known allergies.  Home Medications   Current Outpatient Rx  Name Route Sig Dispense Refill  . ASPIRIN 81 MG PO TBEC Oral Take 81 mg by mouth daily.      . DONEPEZIL HCL 10 MG PO TABS Oral Take 10 mg by mouth 1 day or 1 dose.      . FESOTERODINE FUMARATE ER 8 MG PO TB24 Oral Take 8 mg by mouth daily.    Marland Kitchen LISINOPRIL 40 MG PO TABS Oral Take 20 mg by mouth daily.      Marland Kitchen FISH OIL 1200 MG PO CAPS Oral Take 1 capsule by mouth 2 (two) times daily.    Marland Kitchen TAMSULOSIN HCL 0.4 MG PO CAPS Oral Take 0.4 mg by mouth 2 (two) times daily.      BP 129/91  Pulse 78  Temp(Src) 98.1 F (36.7 C) (Oral)  Resp 20  SpO2 95%  Physical Exam 2340: Physical examination:  Nursing notes reviewed; Vital signs and O2 SAT reviewed;  Constitutional: Well developed, Well nourished, Well  hydrated, In no acute distress; Head:  Normocephalic, atraumatic; Eyes: EOMI, PERRL, No scleral icterus; ENMT: Mouth and pharynx normal, Mucous membranes moist; Neck: Supple, Full range of motion, No lymphadenopathy; Cardiovascular: Regular rate and rhythm, No murmur, rub, or gallop; Respiratory: Breath sounds coarse & equal bilaterally, No wheezes, Normal respiratory effort/excursion; Chest: Nontender, Movement normal; Abdomen: Soft, +mild LUQ and mid-epigastric tenderness to palp, no rebound or guarding, Nondistended, Normal bowel sounds; Genitourinary: No CVA tenderness; Extremities: Pulses normal, No tenderness, No edema, No calf edema or asymmetry.; Neuro: AA&Ox3, confused re: events.  Major CN grossly intact.  No facial droop, speech clear. No gross focal motor or sensory deficits in extremities.; Skin: Color normal, Warm, Dry; Psych:  Affect flat, rambling speech, tangential.    ED Course  Procedures   MDM  MDM Reviewed: previous chart, nursing note and vitals Reviewed previous: ECG Interpretation: ECG, labs and x-ray      Date: 02/01/2012  Rate: 79  Rhythm: normal sinus rhythm  QRS Axis: normal  Intervals: normal  ST/T Wave abnormalities: normal  Conduction Disutrbances:none  Narrative Interpretation:   Old EKG  Reviewed: unchanged; no significant changes from previous EKG dated 1/4//2011.  Results for orders placed during the hospital encounter of 01/31/12  CBC      Component Value Range   WBC 8.5  4.0 - 10.5 (K/uL)   RBC 4.92  4.22 - 5.81 (MIL/uL)   Hemoglobin 15.5  13.0 - 17.0 (g/dL)   HCT 46.9  62.9 - 52.8 (%)   MCV 88.4  78.0 - 100.0 (fL)   MCH 31.5  26.0 - 34.0 (pg)   MCHC 35.6  30.0 - 36.0 (g/dL)   RDW 41.3  24.4 - 01.0 (%)   Platelets 230  150 - 400 (K/uL)  DIFFERENTIAL      Component Value Range   Neutrophils Relative 69  43 - 77 (%)   Neutro Abs 5.9  1.7 - 7.7 (K/uL)   Lymphocytes Relative 20  12 - 46 (%)   Lymphs Abs 1.7  0.7 - 4.0 (K/uL)   Monocytes  Relative 10  3 - 12 (%)   Monocytes Absolute 0.8  0.1 - 1.0 (K/uL)   Eosinophils Relative 1  0 - 5 (%)   Eosinophils Absolute 0.1  0.0 - 0.7 (K/uL)   Basophils Relative 0  0 - 1 (%)   Basophils Absolute 0.0  0.0 - 0.1 (K/uL)  COMPREHENSIVE METABOLIC PANEL      Component Value Range   Sodium 137  135 - 145 (mEq/L)   Potassium 3.4 (*) 3.5 - 5.1 (mEq/L)   Chloride 103  96 - 112 (mEq/L)   CO2 24  19 - 32 (mEq/L)   Glucose, Bld 140 (*) 70 - 99 (mg/dL)   BUN 10  6 - 23 (mg/dL)   Creatinine, Ser 2.72  0.50 - 1.35 (mg/dL)   Calcium 9.5  8.4 - 53.6 (mg/dL)   Total Protein 6.5  6.0 - 8.3 (g/dL)   Albumin 3.8  3.5 - 5.2 (g/dL)   AST 17  0 - 37 (U/L)   ALT 11  0 - 53 (U/L)   Alkaline Phosphatase 96  39 - 117 (U/L)   Total Bilirubin 0.7  0.3 - 1.2 (mg/dL)   GFR calc non Af Amer 89 (*) >90 (mL/min)   GFR calc Af Amer >90  >90 (mL/min)  LIPASE, BLOOD      Component Value Range   Lipase 25  11 - 59 (U/L)  TROPONIN I      Component Value Range   Troponin I <0.30  <0.30 (ng/mL)   Dg Chest 2 View 02/01/2012  *RADIOLOGY REPORT*  Clinical Data: Shortness of breath, cough and weakness.  CHEST - 2 VIEW  Comparison: 04/30/2011  Findings: Two views of the chest demonstrate clear lungs. Heart and mediastinum are within normal limits.  Bony structures are intact. No evidence for a pneumothorax.  IMPRESSION: No acute cardiopulmonary disease.  Original Report Authenticated By: Richarda Overlie, M.D.    2:26 AM:   Pt is confusing historian.  Unclear if pt is describing cardiac CP.  Given pt's risk factors, will admit for obs.  Dx testing d/w pt and family.  Questions answered.  Verb understanding, agreeable to admit.  T/C to [facility], case discussed, including:  HPI, pertinent PM/SHx, VS/PE, dx testing, ED course and treatment:  Agreeable to obs admit, requests to write temporary orders, obtain tele bed to team 7.           Laray Anger, DO 02/02/12 [phone]

## 2012-02-01 NOTE — ED Notes (Signed)
Wife states pt has dementia and he has been refusing to eat since Monday (she has been only able to convince him to drink 1 500 ml bottle of water per day) and he hasn't been sleeping, pacing the floor constantly, for 2 nights.   Pt states pain to L chest and L neck and mild sob.

## 2012-02-01 NOTE — ED Notes (Signed)
Pt continues to c/o chest pain 1/10, neck pain 3/10 and head pain 6/10.  Dr Toniann Fail notified.

## 2012-02-01 NOTE — ED Notes (Signed)
Pt back from X-ray.  

## 2012-02-02 ENCOUNTER — Other Ambulatory Visit (HOSPITAL_COMMUNITY): Payer: Medicare Other

## 2012-02-02 ENCOUNTER — Observation Stay (HOSPITAL_COMMUNITY): Payer: Medicare Other

## 2012-02-02 LAB — TSH: TSH: 2.607 u[IU]/mL (ref 0.350–4.500)

## 2012-02-02 LAB — COMPREHENSIVE METABOLIC PANEL
Alkaline Phosphatase: 97 U/L (ref 39–117)
BUN: 9 mg/dL (ref 6–23)
Chloride: 104 mEq/L (ref 96–112)
GFR calc Af Amer: >90 mL/min (ref >90)
Glucose, Bld: 99 mg/dL (ref 70–99)
Potassium: 3.8 mEq/L (ref 3.5–5.1)
Total Bilirubin: 0.5 mg/dL (ref 0.3–1.2)

## 2012-02-02 LAB — CBC
Platelets: 207 10*3/uL (ref 150–400)
RDW: 12.8 % (ref 11.5–15.5)
WBC: 9.7 10*3/uL (ref 4.0–10.5)

## 2012-02-02 LAB — URINE CULTURE

## 2012-02-02 MED ORDER — REGADENOSON 0.4 MG/5ML IV SOLN
0.4000 mg | Freq: Once | INTRAVENOUS | Status: AC
  Administered 2012-02-02: 13:00:00 0.4 mg via INTRAVENOUS
  Filled 2012-02-02: qty 5

## 2012-02-02 MED ORDER — AMLODIPINE BESYLATE 5 MG PO TABS
5.0000 mg | ORAL_TABLET | Freq: Every day | ORAL | Status: DC
  Administered 2012-02-02 – 2012-02-03 (×2): 5 mg via ORAL
  Filled 2012-02-02 (×3): qty 1

## 2012-02-02 MED ORDER — TECHNETIUM TC 99M TETROFOSMIN IV KIT
10.0000 | PACK | Freq: Once | INTRAVENOUS | Status: AC | PRN
  Administered 2012-02-02: 10 via INTRAVENOUS

## 2012-02-02 MED ORDER — TECHNETIUM TC 99M TETROFOSMIN IV KIT
30.0000 | PACK | Freq: Once | INTRAVENOUS | Status: AC | PRN
  Administered 2012-02-02: 30 via INTRAVENOUS

## 2012-02-02 NOTE — Progress Notes (Signed)
Subjective: He does not admit to having any further chest pain when evaluated this AM.   Objective: Blood pressure 165/85, pulse 99, temperature 98.5 F (36.9 C), temperature source Oral, resp. rate 16, height 6' (1.829 m), weight 93.2 kg (205 lb 7.5 oz), SpO2 97.00%. Weight change:   Intake/Output Summary (Last 24 hours) at 02/02/12 1726 Last data filed at 02/02/12 1100  Gross per 24 hour  Intake   1140 ml  Output   1176 ml  Net    -36 ml    Physical Exam: General appearance: alert, no distress and mildly obese  Lungs: clear to auscultation bilaterally  Heart: regular rate and rhythm, S1, S2 normal, no murmur, click, rub or gallop  Abdomen: Soft, mild tenderness in the lower abdomen, no masses noted no bruits or aneurysm felt.  Ext: no cyanosis clubbing or edema.  Psych: less restless today. Still with some baseline confusion.    Lab Results:  [facility] 02/02/12 0500 01/31/12 2343  NA 138 137  K 3.8 3.4*  CL 104 103  CO2 26 24  GLUCOSE 99 140*  BUN 9 10  CREATININE 0.87 0.85  CALCIUM 9.2 9.5  MG -- --  PHOS -- --    Basename 02/02/12 0500 01/31/12 2343  AST 16 17  ALT 13 11  ALKPHOS 97 96  BILITOT 0.5 0.7  PROT 6.5 6.5  ALBUMIN 3.7 3.8    Basename 01/31/12 2343  LIPASE 25  AMYLASE --    Basename 02/02/12 0500 01/31/12 2343  WBC 9.7 8.5  NEUTROABS -- 5.9  HGB 15.6 15.5  HCT 43.7 43.5  MCV 88.6 88.4  PLT 207 230    Basename 02/01/12 2055 02/01/12 1259 02/01/12 0800  CKTOTAL 83 110 118  CKMB 2.6 3.3 3.2  CKMBINDEX -- -- --  TROPONINI <0.30 <0.30 <0.30   No components found with this basename: POCBNP:3  Basename 02/01/12 0429  DDIMER 0.27   No results found for this basename: HGBA1C:2 in the last 72 hours  Basename 02/01/12 0429  CHOL 173  HDL 37*  LDLCALC 122*  TRIG 70  CHOLHDL 4.7  LDLDIRECT --    Basename 02/02/12 0500  TSH 2.607  T4TOTAL --  T3FREE --  THYROIDAB --    Basename 02/01/12 1259  VITAMINB12 450  FOLATE --    FERRITIN --  TIBC --  IRON --  RETICCTPCT --    Micro Results: Recent Results (from the past 240 hour(s))  URINE CULTURE     Status: Normal   Collection Time   02/01/12 12:59 AM      Component Value Range Status Comment   Specimen Description URINE, CLEAN CATCH   Final    Special Requests NONE   Final    Culture  Setup Time [id number]   Final    Colony Count >=100,000 COLONIES/ML   Final    Culture     Final    Value: Multiple bacterial morphotypes present, none predominant. Suggest appropriate recollection if clinically indicated.   Report Status 02/02/2012 FINAL   Final   MRSA PCR SCREENING     Status: Normal   Collection Time   02/01/12  5:30 AM      Component Value Range Status Comment   MRSA by PCR NEGATIVE  NEGATIVE  Final     Studies/Results: Dg Chest 2 View  02/01/2012  *RADIOLOGY REPORT*  Clinical Data: Shortness of breath, cough and weakness.  CHEST - 2 VIEW  Comparison: 04/30/2011  Findings: Two views  of the chest demonstrate clear lungs. Heart and mediastinum are within normal limits.  Bony structures are intact. No evidence for a pneumothorax.  IMPRESSION: No acute cardiopulmonary disease.  Original Report Authenticated By: Richarda Overlie, M.D.   Nm Myocar Multi W/spect W/wall Motion / Ef  02/02/2012  *RADIOLOGY REPORT*  Clinical Data:  The 66 year old male with and with chest pain and risk factors including hypertension hyperlipidemia.  Additionally the patient has a history schizophrenia and psychiatric possible position.  Additional anxiety.  MYOCARDIAL IMAGING WITH SPECT (REST AND PHARMACOLOGIC-STRESS) GATED LEFT VENTRICULAR WALL MOTION STUDY LEFT VENTRICULAR EJECTION FRACTION  Technique:  Resting myocardial SPECT imaging was initially performed after intravenous administration of radiopharmaceutical. Myocardial SPECT was subsequently performed after additional radiopharmaceutical injection during pharmacologic-stress supervised by the Cardiology staff.  Quantitative gated  imaging was also performed to evaluate left ventricular wall motion, and estimate left ventricular ejection fraction.  Radiopharmaceutical:  Tc-30m Myoview at rest and during stress.  Comparison: none  Findings:  Technique: The rest images are technically good.  The stress images unfortunately are degraded by patient motion.  This motion was felt to be related to patient's anxiety and the patient would not tolerate a repeat scan.  Perfusion: On the stress images, the heart has a triangular appearance on the horizontal long axis which indicates motion degradation.  There is a defect within the apical segment of the anteroseptal wall which is felt to be artifactual and related to motion.  The rest images are normal.  Wall motion:  No focal wall motion abnormality.  Left ventricular ejection fraction:  Calculated left ventricular ejection fraction =  40%. IMPRESSION:  1. Indeterminate exam with no clear evidence of ischemia or infarction.  2.   Left ventricular ejection fraction equal 40%.  Question reliability of this ejection due to motion.  Findings discussed with Dr. 02/02/2012 at 15 15  Original Report Authenticated By: Genevive Bi, M.D.    Medications: Scheduled Meds:   . aspirin EC  325 mg Oral Daily  . atorvastatin  10 mg Oral q1800  . donepezil  10 mg Oral 1 day or 1 dose  . Fesoterodine Fumarate  8 mg Oral Daily  . lisinopril  20 mg Oral Daily  . omega-3 acid ethyl esters  1 g Oral Daily  . regadenoson  0.4 mg Intravenous Once  . sodium chloride  3 mL Intravenous Q12H  . Tamsulosin HCl  0.4 mg Oral BID   Continuous Infusions:   . DISCONTD: sodium chloride 75 mL/hr at 02/01/12 2012   PRN Meds:.acetaminophen, acetaminophen, ALPRAZolam, nitroGLYCERIN, ondansetron (ZOFRAN) IV, ondansetron, technetium tetrofosmin, technetium tetrofosmin  Assessment/Plan: Principal Problem:  *Chest pain Per Dr York Spaniel note, his Celine Ahr is indeterminate. We will need to follow up on an ECHO  and ambulate him to see if any chest pain recurs.    Active Problems:  Dementia Confused. Not as restless or trying to get out of bed like he was yesterday.   HTN Cont Lisinopril. BP has been high therefore I will add Norvasc.    BPH (benign prostatic hyperplasia)   Vitamin B12 deficiency   LOS: 2 days   [facility] [phone] 02/02/2012, 5:26 PM

## 2012-02-02 NOTE — Progress Notes (Signed)
Utilization review complete 

## 2012-02-02 NOTE — Progress Notes (Signed)
Subjective:  Patient chest pain is better was atypical to begin with.  EKG is normal and enzymes are negative  Objective:  Vital Signs in the last 24 hours: BP 165/85  Pulse 99  Temp(Src) 98.5 F (36.9 C) (Oral)  Resp 16  Ht 6' (1.829 m)  Wt 93.2 kg (205 lb 7.5 oz)  BMI 27.87 kg/m2  SpO2 97%  Physical Exam: WM in NAD Lungs:  Clear  Cardiac:  Regular rhythm, normal S1 and S2, no S3 Extremities:  No edema present  Intake/Output from previous day: 02/28 0701 - 03/01 0700 In: 2055 [P.O.:480; I.V.:1575] Out: 975 [Urine:975] Weight change:   Lab Results: Basic Metabolic Panel:  Basename 02/02/12 0500 01/31/12 2343  NA 138 137  K 3.8 3.4*  CL 104 103  CO2 26 24  GLUCOSE 99 140*  BUN 9 10  CREATININE 0.87 0.85   CBC:  Basename 02/02/12 0500 01/31/12 2343  WBC 9.7 8.5  NEUTROABS -- 5.9  HGB 15.6 15.5  HCT 43.7 43.5  MCV 88.6 88.4  PLT 207 230   Cardiac Enzymes:  Basename 02/01/12 2055 02/01/12 1259 02/01/12 0800  CKTOTAL 83 110 118  CKMB 2.6 3.3 3.2  CKMBINDEX -- -- --  TROPONINI <0.30 <0.30 <0.30    Protime: . Lab Results  Component Value Date   INR 1.03 12/07/2009    Telemetry: Sinus without arryhthmias  Lexiscan Cardiolite:  Significant motion artifact on stress images due to patient agitation making interpretation indeterminate for ischemia.  The EF was 40% but may also be low due to motion.  Assessment/Plan:  1.  Chest pain:  Unfortunately the Cardiolite is indeterminate due to significant motion.  I am not convinced it was ischemia that brought him in.   I would have him walk and check an ECHO to see what EF is.  If EF is normal and no recurrent pain, I think he could go home tomorrow.   2. Hypertension 3. Schizophrenia / dementia   W. Ashley Royalty.  MD [facility] 02/02/2012, 5:01 PM

## 2012-02-03 MED ORDER — AMLODIPINE BESYLATE 5 MG PO TABS: 5.0000 mg | ORAL_TABLET | Freq: Every day | ORAL | Status: DC

## 2012-02-03 NOTE — Discharge Instructions (Signed)
Aspirin and Your Heart Aspirin affects the way your blood clots and helps "thin" the blood. Aspirin has many uses in heart disease. It may be used as a primary prevention to help reduce the risk of heart related events. It also can be used as a secondary measure to prevent more heart attacks or to prevent additional damage from blood clots.  ASPIRIN MAY HELP IF YOU:  Have had a heart attack or chest pain.   Have undergone open heart surgery such as CABG (Coronary Artery Bypass Surgery).   Have had coronary angioplasty with or without stents.   Have experienced a stroke or TIA (transient ischemic attack).   Have peripheral vascular disease (PAD).   Have chronic heart rhythm problems such as atrial fibrillation.   Are at risk for heart disease.  BEFORE STARTING ASPIRIN Before you start taking aspirin, your caregiver will need to review your medical history. Many things will need to be taken into consideration, such as:  Smoking status.   Blood pressure.   Diabetes.   Gender.   Weight.   Cholesterol level.  ASPIRIN DOSES  Aspirin should only be taken on the advice of your caregiver. Talk to your caregiver about how much aspirin you should take. Aspirin comes in different doses such as:   81 mg.   162 mg.   325 mg.   The aspirin dose you take may be affected by many factors, some of which include:   Your current medications, especially if your are taking blood-thinners or anti-platelet medicine.   Liver function.   Heart disease risk.   Age.   Aspirin comes in two forms:   Non-enteric-coated. This type of aspirin does not have a coating and is absorbed faster. Non-enteric coated aspirin is recommended for patients experiencing chest pain symptoms. This type of aspirin also comes in a chewable form.   Enteric-coated. This means the aspirin has a special coating that releases the medicine very slowly. Enteric-coated aspirin causes less stomach upset. This type of  aspirin should not be chewed or crushed.  ASPIRIN SIDE EFFECTS Daily use of aspirin can increase your risk of serious side effects, some of these include:  Increased bleeding. This can range from a cut that does not stop bleeding to more serious problems such as stomach bleeding or bleeding into the brain (Intracerebral bleeding).   Increased bruising.   Stomach upset.   An allergic reaction such as red, itchy skin.   Increased risk of bleeding when combined with non-steroidal anti-inflammatory medicine (NSAIDS).   Alcohol should be drank in moderation when taking aspirin. Alcohol can increase the risk of stomach bleeding when taken with aspirin.   Aspirin should not be given to children less than 18 years of age due to the association of Reye syndrome. Reye syndrome is a serious illness that can affect the brain and liver. Studies have linked Reye syndrome with aspirin use in children.   People that have nasal polyps have an increased risk of developing an aspirin allergy.  SEEK MEDICAL CARE IF:   You develop an allergic reaction such as:   Hives.   Itchy skin.   Swelling of the lips, tongue or face.   You develop stomach pain.   You have unusual bleeding or bruising.   You have ringing in your ears.  SEEK IMMEDIATE MEDICAL CARE IF:   You have severe chest pain, especially if the pain is crushing or pressure-like and spreads to the arms, back, neck, or jaw. THIS   IS AN EMERGENCY. Do not wait to see if the pain will go away. Get medical help at once. Call your local emergency services (911 in the [place] DO NOT drive yourself to the hospital.   You have stroke-like symptoms such as:   Loss of vision.   Difficulty talking.   Numbness or weakness on one side of your body.   Numbness or weakness in your arm or leg.   Not thinking clearly or feeling confused.   Your bowel movements are bloody, dark red or black in color.   You vomit or cough up blood.   You have blood  in your urine.   You have shortness of breath, coughing or wheezing.  MAKE SURE YOU:   Understand these instructions.   Will monitor your condition.   Seek immediate medical care if necessary.  Document Released: 11/02/2008 Document Revised: 08/02/2011 Document Reviewed: 11/02/2008 [facility] Patient Information 2012 [place], [place].

## 2012-02-03 NOTE — Progress Notes (Signed)
Subjective:  Feels well. No CP, no SOB. Ambulating well.   Objective:  Vital Signs in the last 24 hours: Temp:  [97.6 F (36.4 C)-99.4 F (37.4 C)] 97.6 F (36.4 C) (03/02 0800) Pulse Rate:  [59-102] 70  (03/02 0800) Resp:  [12-18] 15  (03/02 0800) BP: (122-178)/(75-96) 132/81 mmHg (03/02 0800) SpO2:  [92 %-97 %] 97 % (03/02 0800)  Intake/Output from previous day: 03/01 0701 - 03/02 0700 In: 435 [P.O.:360; I.V.:75] Out: 1502 [Urine:1500; Stool:2]   Physical Exam: General: Well developed, well nourished, in no acute distress. Wearing [place] hat.  Head:  Normocephalic and atraumatic. Lungs: Clear to auscultation and percussion. Heart: Normal S1 and S2.  No murmur, rubs or gallops.  Pulses: Pulses normal in all 4 extremities. Abdomen: soft, non-tender, positive bowel sounds. Extremities: No clubbing or cyanosis. No edema. Neurologic: Alert   Lab Results:  Basename 02/02/12 0500 01/31/12 2343  WBC 9.7 8.5  HGB 15.6 15.5  PLT 207 230    Basename 02/02/12 0500 01/31/12 2343  NA 138 137  K 3.8 3.4*  CL 104 103  CO2 26 24  GLUCOSE 99 140*  BUN 9 10  CREATININE 0.87 0.85    Basename 02/01/12 2055 02/01/12 1259  TROPONINI <0.30 <0.30   Hepatic Function Panel  Basename 02/02/12 0500  PROT 6.5  ALBUMIN 3.7  AST 16  ALT 13  ALKPHOS 97  BILITOT 0.5  BILIDIR --  IBILI --    Basename 02/01/12 0429  CHOL 173     Telemetry: NSR, no adverse arrhythmias Personally viewed.   Cardiac Studies:  ECHO: normal EF, no wall motion abnormalities.   Assessment/Plan:  Chest pain  - atypical. Reviewed Dr. York Spaniel prior note and I agree. ECHO reassuring. NUC was not interpretable due to motion artifact. EF was spurious. ECG unremarkable. Cardiac markers reassuring. I am OK with discharge from a CV perspective. Continue with primary prevention.   HTN  - continue current meds  Schizophrenia  - per primary team  Tobacco cessation  Prior CVA  - ASA. Statin  (Rounding  for Dr. Donnie Aho)  Anne Fu, [facility] 02/03/2012, 10:35 AM

## 2012-02-03 NOTE — Progress Notes (Signed)
  Echocardiogram 2D Echocardiogram has been performed.  [name] 02/03/2012, 8:52 AM

## 2012-02-03 NOTE — Progress Notes (Signed)
Pt. Discharged to home accompanied by wife Escorted to exit via wheelchair by cna

## 2012-02-07 NOTE — Discharge Summary (Signed)
DISCHARGE SUMMARY  [name]  MR#: [id number]  DOB:September 16, 1946  Date of Admission: 01/31/2012 Date of Discharge: 02/07/2012  Attending Physician:Elexa Kivi  Patient's WUJ:WJXBJY,NWGNFA Dennis Millin, MD, MD  Consults:Treatment Team:  Darden Palmer., MD- cardiology  Presenting Complaint: Chest pain  Discharge Diagnoses: Principal Problem:  *Chest pain Active Problems:  Dementia  BPH (benign prostatic hyperplasia)  Vitamin B12 deficiency    Discharge Medications: Medication List  As of 02/07/2012 10:53 AM   TAKE these medications         amLODipine 5 MG tablet   Commonly known as: NORVASC   Take 1 tablet (5 mg total) by mouth daily.      aspirin 81 MG EC tablet   Take 81 mg by mouth daily.      donepezil 10 MG tablet   Commonly known as: ARICEPT   Take 10 mg by mouth 1 day or 1 dose.      Fish Oil 1200 MG Caps   Take 1 capsule by mouth 2 (two) times daily.      lisinopril 40 MG tablet   Commonly known as: PRINIVIL,ZESTRIL   Take 20 mg by mouth daily.      Tamsulosin HCl 0.4 MG Caps   Commonly known as: FLOMAX   Take 0.4 mg by mouth 2 (two) times daily.      TOVIAZ 8 MG Tb24   Generic drug: Fesoterodine Fumarate   Take 8 mg by mouth daily.             Procedures: Dg Chest 2 View  02/01/2012  *RADIOLOGY REPORT*  Clinical Data: Shortness of breath, cough and weakness.  CHEST - 2 VIEW  Comparison: 04/30/2011  Findings: Two views of the chest demonstrate clear lungs. Heart and mediastinum are within normal limits.  Bony structures are intact. No evidence for a pneumothorax.  IMPRESSION: No acute cardiopulmonary disease.  Original Report Authenticated By: Richarda Overlie, M.D.   Nm Myocar Multi W/spect W/wall Motion / Ef  02/02/2012  *RADIOLOGY REPORT*  Clinical Data:  The 66 year old male with and with chest pain and risk factors including hypertension hyperlipidemia.  Additionally the patient has a history schizophrenia and psychiatric possible position.   Additional anxiety.  MYOCARDIAL IMAGING WITH SPECT (REST AND PHARMACOLOGIC-STRESS) GATED LEFT VENTRICULAR WALL MOTION STUDY LEFT VENTRICULAR EJECTION FRACTION  Technique:  Resting myocardial SPECT imaging was initially performed after intravenous administration of radiopharmaceutical. Myocardial SPECT was subsequently performed after additional radiopharmaceutical injection during pharmacologic-stress supervised by the Cardiology staff.  Quantitative gated imaging was also performed to evaluate left ventricular wall motion, and estimate left ventricular ejection fraction.  Radiopharmaceutical:  Tc-13m Myoview at rest and during stress.  Comparison: none  Findings:  Technique: The rest images are technically good.  The stress images unfortunately are degraded by patient motion.  This motion was felt to be related to patient's anxiety and the patient would not tolerate a repeat scan.  Perfusion: On the stress images, the heart has a triangular appearance on the horizontal long axis which indicates motion degradation.  There is a defect within the apical segment of the anteroseptal wall which is felt to be artifactual and related to motion.  The rest images are normal.  Wall motion:  No focal wall motion abnormality.  Left ventricular ejection fraction:  Calculated left ventricular ejection fraction =  40%. IMPRESSION:  1. Indeterminate exam with no clear evidence of ischemia or infarction.  2.   Left ventricular ejection fraction equal 40%.  Question reliability of this ejection due to motion.  Findings discussed with Dr. 02/02/2012 at 15 15  Original Report Authenticated By: Genevive Bi, M.D.    Echocardiogram 02/03/12 Study Conclusions  Left ventricle: The cavity size was normal. Systolic function was normal. The estimated ejection fraction was in the range of 60% to 65%. Wall motion was normal; there were no regional wall motion abnormalities. Doppler parameters are consistent with abnormal  left ventricular relaxation (grade 1 diastolic dysfunction).  Impressions:  - Compared to the prior study, there has been no significant interval change.   Hospital Course: Principal Problem:  *Chest pain 66 year-old male history of hypertension and ongoing tobacco abuse, dementia and previous diagnoses of schizophrenia (off medications for a year) and history of stroke with no residual deficits was brought in the ER complaining of chest pain. He continued to complain of chest pain in the ER and was therefore admitted for further workup.Due to his dementia he was unable to characterize the pain further. A cardiology consult was requested and per wife's request Dr Donnie Aho was called. 3 sets of cardiac enzymes were negative. A myoview stress test was performed on the following day and found to be indeterminate. His EKG was normal. A 2D ECHO was performed and did not show any wall motion abnormality. HIs chest pain resolved on the day of admission and did not recur during the hospital stay. He was able to ambulate without complaints of pain or dyspnea on exertion. He remained in NSR on telemetry. Dr Anne Fu evaluated him on 3/2 and felt that it was safe to discharge the patient from the CV perspective.   Active Problems:  Dementia  BPH (benign prostatic hyperplasia)  Vitamin B12 deficiency   Day of Discharge Physical Exam: BP 132/81  Pulse 70  Temp(Src) 97.6 F (36.4 C) (Oral)  Resp 15  Ht 6' (1.829 m)  Wt 93.2 kg (205 lb 7.5 oz)  BMI 27.87 kg/m2  SpO2 97% General appearance: alert, no distress and mildly obese  Lungs: clear to auscultation bilaterally  Heart: regular rate and rhythm, S1, S2 normal, no murmur, click, rub or gallop  Abdomen: Soft, mild tenderness in the lower abdomen, no masses noted no bruits or aneurysm felt.  Ext: no cyanosis clubbing or edema.  Psych: baseline confusion.      Disposition: stable for discharge   Follow-up Appts: Discharge Orders    Future  Orders Please Complete By Expires   Diet - low sodium heart healthy      Increase activity slowly         Follow-up with Dr.Mabe in 1-2 weeks.  Time on Discharge: >67min  Signed: Micky Overturf 02/07/2012, 10:53 AM

## 2012-06-22 ENCOUNTER — Encounter: Payer: Self-pay | Admitting: Internal Medicine

## 2012-06-22 DIAGNOSIS — F419 Anxiety disorder, unspecified: Secondary | ICD-10-CM | POA: Insufficient documentation

## 2012-06-22 DIAGNOSIS — K219 Gastro-esophageal reflux disease without esophagitis: Secondary | ICD-10-CM | POA: Insufficient documentation

## 2012-06-22 DIAGNOSIS — R51 Headache: Secondary | ICD-10-CM

## 2012-06-22 DIAGNOSIS — I639 Cerebral infarction, unspecified: Secondary | ICD-10-CM | POA: Insufficient documentation

## 2012-06-22 DIAGNOSIS — F329 Major depressive disorder, single episode, unspecified: Secondary | ICD-10-CM | POA: Insufficient documentation

## 2012-06-22 DIAGNOSIS — K621 Rectal polyp: Secondary | ICD-10-CM | POA: Insufficient documentation

## 2012-06-22 DIAGNOSIS — Z0001 Encounter for general adult medical examination with abnormal findings: Secondary | ICD-10-CM | POA: Insufficient documentation

## 2012-06-22 DIAGNOSIS — C44301 Unspecified malignant neoplasm of skin of nose: Secondary | ICD-10-CM | POA: Insufficient documentation

## 2012-06-22 DIAGNOSIS — K760 Fatty (change of) liver, not elsewhere classified: Secondary | ICD-10-CM | POA: Insufficient documentation

## 2012-06-27 ENCOUNTER — Encounter: Payer: Self-pay | Admitting: Internal Medicine

## 2012-06-27 ENCOUNTER — Ambulatory Visit (INDEPENDENT_AMBULATORY_CARE_PROVIDER_SITE_OTHER): Payer: Medicare Other | Admitting: Internal Medicine

## 2012-06-27 VITALS — BP 132/90 | HR 86 | Temp 97.0°F | Ht 72.0 in | Wt 213.1 lb

## 2012-06-27 DIAGNOSIS — F0390 Unspecified dementia without behavioral disturbance: Secondary | ICD-10-CM

## 2012-06-27 DIAGNOSIS — N529 Male erectile dysfunction, unspecified: Secondary | ICD-10-CM | POA: Insufficient documentation

## 2012-06-27 DIAGNOSIS — R259 Unspecified abnormal involuntary movements: Secondary | ICD-10-CM

## 2012-06-27 DIAGNOSIS — L989 Disorder of the skin and subcutaneous tissue, unspecified: Secondary | ICD-10-CM

## 2012-06-27 DIAGNOSIS — R251 Tremor, unspecified: Secondary | ICD-10-CM

## 2012-06-27 DIAGNOSIS — R7302 Impaired glucose tolerance (oral): Secondary | ICD-10-CM

## 2012-06-27 DIAGNOSIS — Z0000 Encounter for general adult medical examination without abnormal findings: Secondary | ICD-10-CM

## 2012-06-27 DIAGNOSIS — N3281 Overactive bladder: Secondary | ICD-10-CM

## 2012-06-27 DIAGNOSIS — F319 Bipolar disorder, unspecified: Secondary | ICD-10-CM | POA: Insufficient documentation

## 2012-06-27 MED ORDER — TADALAFIL 20 MG PO TABS: 20.0000 mg | ORAL_TABLET | Freq: Every day | ORAL | Status: DC | PRN

## 2012-06-27 MED ORDER — FESOTERODINE FUMARATE ER 8 MG PO TB24: 8.0000 mg | ORAL_TABLET | Freq: Every day | ORAL | Status: DC

## 2012-06-27 MED ORDER — ALPRAZOLAM 0.5 MG PO TABS: 0.5000 mg | ORAL_TABLET | Freq: Four times a day (QID) | ORAL | Status: AC | PRN

## 2012-06-27 NOTE — Patient Instructions (Addendum)
Continue all other medications as before Your [name] was refilled today Please have the pharmacy call with any other refills you may need. You will be contacted regarding the referral for: dermatology, colonoscopy, and Neurology Please call if you change your mind about the referral to [facility] Please keep your appointments with your specialists as you have planned - urology No blood work needed today Please return in 6 months, or sooner if needed

## 2012-06-27 NOTE — Assessment & Plan Note (Signed)
Pt asks for cialis prn ,  to f/u any worsening symptoms or concerns

## 2012-06-27 NOTE — Assessment & Plan Note (Signed)
To re-start [name] as has done well with this in the past

## 2012-06-27 NOTE — Assessment & Plan Note (Signed)
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

## 2012-06-27 NOTE — Assessment & Plan Note (Signed)
Untreated and signficant, wife and pt decline tx at this time or referral

## 2012-06-27 NOTE — Assessment & Plan Note (Addendum)
Asympt, for a1c next labs 

## 2012-06-27 NOTE — Progress Notes (Signed)
Subjective:    Patient ID: [name], male    DOB: [birth year], 66 y.o.   MRN: [id number]  HPI  Here for wellness and to establish as new pt with wife who unfortunately seems in some denial over his dementia and signficant psychiatric illness;  Both state many times he has not done well over the yrs and even worse sometimes with mult psych meds and refuse further attempt at this time.   Othewise overall doing ok;  Pt denies CP, worsening SOB, DOE, wheezing, orthopnea, PND, worsening LE edema, palpitations, dizziness or syncope.  Pt denies neurological change such as new Headache, facial or extremity weakness, but has ongoing rather severe tremulousness/tremor/agitation/anxiety, and literally cannot sit still due to constant movements of arms and legs.  Pt denies polydipsia, polyuria, or low sugar symptoms. Pt states overall good compliance with treatment and medications, good tolerability, and trying to follow lower cholesterol diet.  Pt denies worsening depressive symptoms, suicidal ideation or panic. No fever, wt loss, night sweats, loss of appetite, or other constitutional symptoms.  Pt states fair ability with ADL's with much assist from wfie, ?low to mod fall risk, home safety reviewed and adequate, no significant changes in hearing or vision.  Does have OAB and has done well with toviaz, but ran out and does not see urology for another 6 mo Past Medical History  Diagnosis Date  . Hypertension   . Dementia   . Stroke   . Hyperlipidemia   . Schizophrenia   . HTN (hypertension)   . Vitamin B12 deficiency   . BPH (benign prostatic hyperplasia) 02/01/2012  . Depression 06/22/2012  . GERD (gastroesophageal reflux disease) 06/22/2012  . Fatty liver 06/22/2012  . Anxiety   . Skin cancer of nose   . Rectal polyp   . Chronic headaches 06/22/2012  . Glucose intolerance (impaired glucose tolerance) 06/27/2012  . Bipolar affective disorder 06/27/2012   Past Surgical History  Procedure Date  . Rectal  polypectomy Nov 02, 2000    Dr Abbey Chatters    reports that he has been smoking Cigarettes.  He has been smoking about .25 packs per day for the past 0 years. He has quit using smokeless tobacco. He reports that he does not drink alcohol or use illicit drugs. family history includes Dementia in his father. No Known Allergies Current Outpatient Prescriptions on File Prior to Visit  Medication Sig Dispense Refill  . amLODipine (NORVASC) 5 MG tablet Take 1 tablet (5 mg total) by mouth daily.  30 tablet  0  . aspirin 81 MG EC tablet Take 81 mg by mouth daily.        Marland Kitchen donepezil (ARICEPT) 10 MG tablet Take 10 mg by mouth 1 day or 1 dose.        Marland Kitchen lisinopril (PRINIVIL,ZESTRIL) 40 MG tablet Take 20 mg by mouth daily.        . Omega-3 Fatty Acids (FISH OIL) 1200 MG CAPS Take 1 capsule by mouth 2 (two) times daily.      Marland Kitchen DISCONTD: Fesoterodine Fumarate (TOVIAZ) 8 MG TB24 Take 8 mg by mouth daily.      . tadalafil (CIALIS) 20 MG tablet Take 1 tablet (20 mg total) by mouth daily as needed for erectile dysfunction.  5 tablet  11   Review of Systems Review of Systems  Constitutional: Negative for diaphoresis and unexpected weight change.  HENT: Negative for drooling and tinnitus.   Eyes: Negative for photophobia and visual disturbance.  Respiratory: Negative  for choking and stridor.   Gastrointestinal: Negative for vomiting and blood in stool.  Genitourinary: Negative for hematuria and decreased urine volume.  Musculoskeletal: Negative for acute joint swelling Skin: Negative for color change and wound.  Neurological: Negative for numbness.     Objective:   Physical Exam BP 132/90  Pulse 86  Temp 97 F (36.1 C) (Oral)  Ht 6' (1.829 m)  Wt 213 lb 2 oz (96.673 kg)  BMI 28.90 kg/m2  SpO2 96% Physical Exam  VS noted Constitutional: Pt is oriented to person, place, and time. Appears well-developed and well-nourished.  HENT:  Head: Normocephalic and atraumatic.  Right Ear: External ear normal.    Left Ear: External ear normal.  Nose: Nose normal.  Mouth/Throat: Oropharynx is clear and moist.  Eyes: Conjunctivae and EOM are normal. Pupils are equal, round, and reactive to light.  Neck: Normal range of motion. Neck supple. No JVD present. No tracheal deviation present.  Cardiovascular: Normal rate, regular rhythm, normal heart sounds and intact distal pulses.   Pulmonary/Chest: Effort normal and breath sounds normal.  Abdominal: Soft. Bowel sounds are normal. There is no tenderness.  Musculoskeletal: Normal range of motion. Exhibits no edema.  Lymphadenopathy:  Has no cervical adenopathy.  Neurological: Pt is alert and oriented to person, place, and time. Pt has normal reflexes. No cranial nerve deficit, motor/dtr intact, large tremulousness noted arms and legs near constant Skin: Skin is warm and dry. No rash noted. Right neck with 1/2 cm lesion, slightly raise, nonulcerated, nontender Psychiatric:  1-2+ nervous, prob mild dysphoric, though content c/w dimished capacity, memory loss, formal testing not done     Assessment & Plan:

## 2012-06-27 NOTE — Assessment & Plan Note (Signed)
To cont med, refer neuro per reqeust

## 2012-06-27 NOTE — Assessment & Plan Note (Signed)
For neuro referral, ? parkinsons

## 2012-06-27 NOTE — Assessment & Plan Note (Signed)
Overall doing well, age appropriate education and counseling updated, referrals for preventative services and immunizations addressed, dietary and smoking counseling addressed, most recent labs and ECG reviewed.  I have personally reviewed and have noted: 1) the patient's medical and social history 2) The pt's use of alcohol, tobacco, and illicit drugs 3) The patient's current medications and supplements 4) Functional ability including ADL's, fall risk, home safety risk, hearing and visual impairment 5) Diet and physical activities 6) Evidence for depression or mood disorder 7) The patient's height, weight, and BMI have been recorded in the chart I have made referrals, and provided counseling and education based on review of the above Recent labs reviewed, not felt he needs further labs at this time Lab Results  Component Value Date   WBC 9.7 02/02/2012   HGB 15.6 02/02/2012   HCT 43.7 02/02/2012   PLT 207 02/02/2012   GLUCOSE 99 02/02/2012   CHOL 173 02/01/2012   TRIG 70 02/01/2012   HDL 37* 02/01/2012   LDLCALC 122* 02/01/2012   ALT 13 02/02/2012   AST 16 02/02/2012   NA 138 02/02/2012   K 3.8 02/02/2012   CL 104 02/02/2012   CREATININE 0.87 02/02/2012   BUN 9 02/02/2012   CO2 26 02/02/2012   TSH 2.607 02/02/2012   INR 1.03 12/07/2009   HGBA1C  Value: 5.4 (NOTE)                                                                       According to the ADA Clinical Practice Recommendations for 2011, when HbA1c is used as a screening test:   >=6.5%   Diagnostic of Diabetes Mellitus           (if abnormal result  is confirmed)  5.7-6.4%   Increased risk of developing Diabetes Mellitus  References:Diagnosis and Classification of Diabetes Mellitus,Diabetes Care,2011,34(Suppl 1):S62-S69 and Standards of Medical Care in         Diabetes - 2011,Diabetes Care,2011,34  (Suppl 1):S11-S61. 04/30/2011

## 2012-07-01 ENCOUNTER — Encounter: Payer: Self-pay | Admitting: Internal Medicine

## 2012-07-03 ENCOUNTER — Encounter: Payer: Self-pay | Admitting: Internal Medicine

## 2012-07-17 ENCOUNTER — Other Ambulatory Visit: Payer: Self-pay | Admitting: Neurology

## 2012-07-17 DIAGNOSIS — F411 Generalized anxiety disorder: Secondary | ICD-10-CM

## 2012-07-17 DIAGNOSIS — R413 Other amnesia: Secondary | ICD-10-CM

## 2012-07-17 DIAGNOSIS — F329 Major depressive disorder, single episode, unspecified: Secondary | ICD-10-CM

## 2012-08-07 ENCOUNTER — Ambulatory Visit
Admission: RE | Admit: 2012-08-07 | Discharge: 2012-08-07 | Disposition: A | Discharge status: Home / Self Care | Payer: Medicare Other | Source: Ambulatory Visit | Attending: Neurology | Admitting: Neurology

## 2012-08-07 DIAGNOSIS — R413 Other amnesia: Secondary | ICD-10-CM

## 2012-08-07 DIAGNOSIS — F329 Major depressive disorder, single episode, unspecified: Secondary | ICD-10-CM

## 2012-08-07 DIAGNOSIS — F411 Generalized anxiety disorder: Secondary | ICD-10-CM

## 2012-08-13 ENCOUNTER — Telehealth: Payer: Self-pay

## 2012-08-13 NOTE — Telephone Encounter (Signed)
Informed the wife of MD instructions.

## 2012-08-13 NOTE — Telephone Encounter (Signed)
Pt's spouse is requesting a call back form Dr Raphael Gibney nurse to discuss pt's recent MRI

## 2012-08-13 NOTE — Telephone Encounter (Signed)
This was done Jul 17, 2012, with the interpretation done by Neurology at [facility]  Please direct pt to inquire about results there

## 2012-08-14 ENCOUNTER — Encounter: Payer: Self-pay | Admitting: Internal Medicine

## 2012-08-14 ENCOUNTER — Ambulatory Visit (AMBULATORY_SURGERY_CENTER): Payer: Medicare Other

## 2012-08-14 VITALS — Ht 72.0 in | Wt 205.3 lb

## 2012-08-14 DIAGNOSIS — Z860100 Personal history of colon polyps, unspecified: Secondary | ICD-10-CM

## 2012-08-14 DIAGNOSIS — Z1211 Encounter for screening for malignant neoplasm of colon: Secondary | ICD-10-CM

## 2012-08-14 MED ORDER — MOVIPREP 100 G PO SOLR: ORAL | Status: DC

## 2012-08-14 NOTE — Progress Notes (Signed)
Pt came into office with his wife to review paperwork for his colonoscopy on 08/28/12 with Dr Juanda Chance. His wife ,Gean Quint, will be present with pt at his colon appt to assist with paperwork due to a complicated medical history.Wife states her husband had a colonoscopy done 10 years ago with Dr Kingsley Plan. A medical release form was filled out and will be given to [facility]

## 2012-08-20 ENCOUNTER — Telehealth: Payer: Self-pay | Admitting: Internal Medicine

## 2012-08-20 NOTE — Telephone Encounter (Signed)
Spoke with wife(Tansy) regarding the cost of the Moviprep. We will mail her a Moviprep voucher for a free unit of the Moviprep. She will call back if she has any other questions or problems.

## 2012-08-28 ENCOUNTER — Encounter: Payer: Self-pay | Admitting: Internal Medicine

## 2012-08-28 ENCOUNTER — Ambulatory Visit (AMBULATORY_SURGERY_CENTER): Payer: Medicare Other | Admitting: Internal Medicine

## 2012-08-28 VITALS — BP 165/97 | HR 65 | Temp 98.1°F | Resp 12 | Wt 205.0 lb

## 2012-08-28 DIAGNOSIS — D126 Benign neoplasm of colon, unspecified: Secondary | ICD-10-CM

## 2012-08-28 DIAGNOSIS — Z1211 Encounter for screening for malignant neoplasm of colon: Secondary | ICD-10-CM

## 2012-08-28 DIAGNOSIS — Z86010 Personal history of colonic polyps: Secondary | ICD-10-CM

## 2012-08-28 MED ORDER — SODIUM CHLORIDE 0.9 % IV SOLN: 500.0000 mL | INTRAVENOUS | Status: DC

## 2012-08-28 MED ORDER — PROMETHAZINE HCL 25 MG PO TABS: 12.5000 mg | ORAL_TABLET | Freq: Four times a day (QID) | ORAL | Status: DC | PRN

## 2012-08-28 MED ORDER — POLYETHYLENE GLYCOL 3350 17 GM/SCOOP PO POWD: 17.0000 g | Freq: Every day | ORAL | Status: DC

## 2012-08-28 NOTE — Op Note (Signed)
[facility] [address] [place], [address]   COLONOSCOPY PROCEDURE REPORT  PATIENT: Love, Dennis  MR#: [id number] BIRTHDATE: 09-17-1946 , 66  yrs. old GENDER: Male ENDOSCOPIST: Hart Carwin, MD REFERRED BY:  Oliver Barre, M.D. PROCEDURE DATE:  08/28/2012 PROCEDURE:   Colonoscopy, surveillance and Colonoscopy, screening ASA CLASS:   Class III INDICATIONS:last colon 2001- colon polyp, having constipation. MEDICATIONS: MAC sedation, administered by CRNA and Propofol (Diprivan) 250 mg IV  DESCRIPTION OF PROCEDURE:   After the risks and benefits and of the procedure were explained, informed consent was obtained.  A digital rectal exam revealed no abnormalities of the rectum.    The LB [id number]  endoscope was introduced through the anus and advanced to the cecum, which was identified by both the appendix and ileocecal valve .  The quality of the prep was good, using MoviPrep .  The instrument was then slowly withdrawn as the colon was fully examined.     COLON FINDINGS: A smooth sessile polyp ranging between 3-78mm in size was found in the descending colon.  A polypectomy was performed with cold forceps.  The resection was complete and the polyp tissue was completely retrieved.     Retroflexed views revealed no abnormalities.     The scope was then withdrawn from the patient and the procedure completed.  COMPLICATIONS: There were no complications. ENDOSCOPIC IMPRESSION: Sessile polyp ranging between 3-52mm in size was found in the descending colon; polypectomy was performed with cold forceps  RECOMMENDATIONS: 1.  await pathology results 2.  High fiber diet 3.  Miralax 17 gm 3x/week   REPEAT EXAM: In 10 year(s)  for Colonoscopy.  cc:  _______________________________ eSignedHart Carwin, MD 08/28/2012 10:35 AM

## 2012-08-28 NOTE — Progress Notes (Signed)
The pt tolerated the colonoscopy very well. Maw   

## 2012-08-28 NOTE — Patient Instructions (Addendum)
YOU HAD AN ENDOSCOPIC PROCEDURE TODAY AT THE [facility]: Refer to the procedure report that was given to you for any specific questions about what was found during the examination.  If the procedure report does not answer your questions, please call your gastroenterologist to clarify.  If you requested that your care partner not be given the details of your procedure findings, then the procedure report has been included in a sealed envelope for you to review at your convenience later.  YOU SHOULD EXPECT: Some feelings of bloating in the abdomen. Passage of more gas than usual.  Walking can help get rid of the air that was put into your GI tract during the procedure and reduce the bloating. If you had a lower endoscopy (such as a colonoscopy or flexible sigmoidoscopy) you may notice spotting of blood in your stool or on the toilet paper. If you underwent a bowel prep for your procedure, then you may not have a normal bowel movement for a few days.  DIET: Your first meal following the procedure should be a light meal and then it is ok to progress to your normal diet.  A half-sandwich or bowl of soup is an example of a good first meal.  Heavy or fried foods are harder to digest and may make you feel nauseous or bloated.  Likewise meals heavy in dairy and vegetables can cause extra gas to form and this can also increase the bloating.  Drink plenty of fluids but you should avoid alcoholic beverages for 24 hours.  ACTIVITY: Your care partner should take you home directly after the procedure.  You should plan to take it easy, moving slowly for the rest of the day.  You can resume normal activity the day after the procedure however you should NOT DRIVE or use heavy machinery for 24 hours (because of the sedation medicines used during the test).    SYMPTOMS TO REPORT IMMEDIATELY: A gastroenterologist can be reached at any hour.  During normal business hours, 8:30 AM to 5:00 PM Monday through Friday,  call [phone].  After hours and on weekends, please call the GI answering service at [phone] who will take a message and have the physician on call contact you.   Following lower endoscopy (colonoscopy or flexible sigmoidoscopy):  Excessive amounts of blood in the stool  Significant tenderness or worsening of abdominal pains  Swelling of the abdomen that is new, acute  Fever of 100F or higher    FOLLOW UP: If any biopsies were taken you will be contacted by phone or by letter within the next 1-3 weeks.  Call your gastroenterologist if you have not heard about the biopsies in 3 weeks.  Our staff will call the home number listed on your records the next business day following your procedure to check on you and address any questions or concerns that you may have at that time regarding the information given to you following your procedure. This is a courtesy call and so if there is no answer at the home number and we have not heard from you through the emergency physician on call, we will assume that you have returned to your regular daily activities without incident.  SIGNATURES/CONFIDENTIALITY: You and/or your care partner have signed paperwork which will be entered into your electronic medical record.  These signatures attest to the fact that that the information above on your After Visit Summary has been reviewed and is understood.  Full responsibility of the confidentiality   of this discharge information lies with you and/or your care-partner.     Information on polyps given to you today    You have an appointment to see Dr. Juanda Chance in her office November 20th 2013 @ 9:45 am   Dr. Juanda Chance gave you 2 Rxs today 1) phenergan 2) miralax

## 2012-08-28 NOTE — Progress Notes (Signed)
Patient did not experience any of the following events: a burn prior to discharge; a fall within the facility; wrong site/side/patient/procedure/implant event; or a hospital transfer or hospital admission upon discharge from the facility. (G8907) Patient did not have preoperative order for IV antibiotic SSI prophylaxis. (G8918)  

## 2012-08-29 ENCOUNTER — Telehealth: Payer: Self-pay | Admitting: *Deleted

## 2012-08-29 NOTE — Telephone Encounter (Signed)
  Follow up Call-  Call back number 08/28/2012  Post procedure Call Back phone  # [phone]  Permission to leave phone message Yes     Patient questions:  Do you have a fever, pain , or abdominal swelling? no Pain Score  0 *  Have you tolerated food without any problems? yes  Have you been able to return to your normal activities? yes  Do you have any questions about your discharge instructions: Diet   no Medications  no Follow up visit  no  Do you have questions or concerns about your Care? no  Actions: * If pain score is 4 or above: No action needed, pain <4.

## 2012-09-02 ENCOUNTER — Encounter: Payer: Self-pay | Admitting: Internal Medicine

## 2012-10-03 ENCOUNTER — Encounter: Payer: Self-pay | Admitting: *Deleted

## 2012-10-23 ENCOUNTER — Ambulatory Visit: Payer: Medicare Other | Admitting: Internal Medicine

## 2012-11-04 ENCOUNTER — Other Ambulatory Visit: Payer: Self-pay | Admitting: *Deleted

## 2012-11-04 MED ORDER — PROMETHAZINE HCL 25 MG PO TABS: 12.5000 mg | ORAL_TABLET | Freq: Four times a day (QID) | ORAL | Status: DC | PRN

## 2012-12-19 ENCOUNTER — Encounter: Payer: Self-pay | Admitting: *Deleted

## 2012-12-24 ENCOUNTER — Encounter: Payer: Self-pay | Admitting: Internal Medicine

## 2012-12-24 ENCOUNTER — Ambulatory Visit (INDEPENDENT_AMBULATORY_CARE_PROVIDER_SITE_OTHER): Payer: Medicare Other | Admitting: Internal Medicine

## 2012-12-24 ENCOUNTER — Other Ambulatory Visit (INDEPENDENT_AMBULATORY_CARE_PROVIDER_SITE_OTHER): Payer: Medicare Other

## 2012-12-24 ENCOUNTER — Other Ambulatory Visit: Payer: Self-pay | Admitting: Internal Medicine

## 2012-12-24 VITALS — BP 132/90 | HR 76 | Temp 98.1°F | Ht 72.0 in | Wt 211.4 lb

## 2012-12-24 DIAGNOSIS — E785 Hyperlipidemia, unspecified: Secondary | ICD-10-CM

## 2012-12-24 DIAGNOSIS — N390 Urinary tract infection, site not specified: Secondary | ICD-10-CM

## 2012-12-24 DIAGNOSIS — R1013 Epigastric pain: Secondary | ICD-10-CM

## 2012-12-24 DIAGNOSIS — K3189 Other diseases of stomach and duodenum: Secondary | ICD-10-CM

## 2012-12-24 DIAGNOSIS — E538 Deficiency of other specified B group vitamins: Secondary | ICD-10-CM

## 2012-12-24 DIAGNOSIS — N320 Bladder-neck obstruction: Secondary | ICD-10-CM

## 2012-12-24 DIAGNOSIS — R972 Elevated prostate specific antigen [PSA]: Secondary | ICD-10-CM

## 2012-12-24 DIAGNOSIS — I10 Essential (primary) hypertension: Secondary | ICD-10-CM

## 2012-12-24 DIAGNOSIS — N400 Enlarged prostate without lower urinary tract symptoms: Secondary | ICD-10-CM

## 2012-12-24 LAB — CBC WITH DIFFERENTIAL/PLATELET
Basophils Absolute: 0 10*3/uL (ref 0.0–0.1)
Basophils Relative: 0.4 % (ref 0.0–3.0)
Eosinophils Absolute: 0.2 10*3/uL (ref 0.0–0.7)
Lymphocytes Relative: 16.6 % (ref 12.0–46.0)
MCHC: 34 g/dL (ref 30.0–36.0)
Neutrophils Relative %: 76 % (ref 43.0–77.0)
RBC: 5.49 Mil/uL (ref 4.22–5.81)
RDW: 13.6 % (ref 11.5–14.6)

## 2012-12-24 LAB — HEPATIC FUNCTION PANEL
ALT: 18 U/L (ref 0–53)
Bilirubin, Direct: 0.1 mg/dL (ref 0.0–0.3)
Total Protein: 6.7 g/dL (ref 6.0–8.3)

## 2012-12-24 LAB — URINALYSIS, ROUTINE W REFLEX MICROSCOPIC
Hgb urine dipstick: NEGATIVE
Nitrite: POSITIVE
Urobilinogen, UA: 0.2 (ref 0.0–1.0)

## 2012-12-24 LAB — BASIC METABOLIC PANEL
BUN: 15 mg/dL (ref 6–23)
Chloride: 105 mEq/L (ref 96–112)
GFR: 89.33 mL/min (ref >60.00)
Glucose, Bld: 107 mg/dL — ABNORMAL HIGH (ref 70–99)
Potassium: 4.5 mEq/L (ref 3.5–5.1)
Sodium: 140 mEq/L (ref 135–145)

## 2012-12-24 LAB — TSH: TSH: 1.89 u[IU]/mL (ref 0.35–5.50)

## 2012-12-24 LAB — LIPID PANEL: HDL: 37.7 mg/dL — ABNORMAL LOW (ref >39.00)

## 2012-12-24 LAB — H. PYLORI ANTIBODY, IGG: H Pylori IgG: NEGATIVE

## 2012-12-24 MED ORDER — PANTOPRAZOLE SODIUM 40 MG PO TBEC: 40.0000 mg | DELAYED_RELEASE_TABLET | Freq: Every day | ORAL | Status: DC

## 2012-12-24 MED ORDER — TAMSULOSIN HCL 0.4 MG PO CAPS: 0.4000 mg | ORAL_CAPSULE | Freq: Every day | ORAL | Status: DC

## 2012-12-24 MED ORDER — CEPHALEXIN 500 MG PO CAPS: 500.0000 mg | ORAL_CAPSULE | Freq: Four times a day (QID) | ORAL | Status: DC

## 2012-12-24 MED ORDER — DONEPEZIL HCL 10 MG PO TABS: 10.0000 mg | ORAL_TABLET | ORAL | Status: DC

## 2012-12-24 MED ORDER — LISINOPRIL 40 MG PO TABS: 40.0000 mg | ORAL_TABLET | Freq: Every day | ORAL | Status: DC

## 2012-12-24 MED ORDER — ALPRAZOLAM 0.5 MG PO TABS: 0.5000 mg | ORAL_TABLET | Freq: Every day | ORAL | Status: DC | PRN

## 2012-12-24 MED ORDER — ATORVASTATIN CALCIUM 10 MG PO TABS: 10.0000 mg | ORAL_TABLET | Freq: Every day | ORAL | Status: DC

## 2012-12-24 MED ORDER — SILDENAFIL CITRATE 100 MG PO TABS: 50.0000 mg | ORAL_TABLET | Freq: Every day | ORAL | Status: DC | PRN

## 2012-12-24 MED ORDER — AMLODIPINE BESYLATE 5 MG PO TABS: 5.0000 mg | ORAL_TABLET | Freq: Every day | ORAL | Status: DC

## 2012-12-24 MED ORDER — ONDANSETRON HCL 4 MG PO TABS: 4.0000 mg | ORAL_TABLET | Freq: Three times a day (TID) | ORAL | Status: DC | PRN

## 2012-12-24 NOTE — Assessment & Plan Note (Addendum)
With mild epigastric tender, cant r/o gastritis, for H pylori, trial protonix 40, and f/u GI as planned, if not improved might consider EGD I should think but I dont see red flags today, also zofran prn\  Note:  Total time for pt hx, exam, review of record with pt in the room, determination of diagnoses and plan for further eval and tx is > 40 min, with over 50% spent in coordination and counseling of patient

## 2012-12-24 NOTE — Patient Instructions (Addendum)
Please take all new medication as prescribed - the protonix 40 mg per day for the stomach, and flomax for the prostate, as well as the viagra Please continue all other medications as before All of your medications were refilled today Please go to the LAB in the Basement (turn left off the elevator) for the tests to be done today You will be contacted by phone if any changes need to be made immediately.  Otherwise, you will receive a letter about your results with an explanation Please remember to sign up for My Chart if you have not done so, as this will be important to you in the future with finding out test results, communicating by private email, and scheduling acute appointments online when needed. Please keep your appointments with your specialists as you have planned- GI next month We are calling for your last urology record, as well as MRI report Please return in 6 months, or sooner if needed

## 2012-12-24 NOTE — Progress Notes (Signed)
Subjective:    Patient ID: [name], male    DOB: July 19, 1946, 67 y.o.   MRN: [id number]  HPI  Here to f/u with wife, Pt denies chest pain, increased sob or doe, wheezing, orthopnea, PND, increased LE swelling, palpitations, dizziness or syncope.  Pt denies polydipsia, polyuria, or low sugar symptoms such as weakness or confusion improved with po intake.  Pt denies new neurological symptoms such as new headache, or facial or extremity weakness or numbness.   Pt states overall good compliance with meds, has been trying to follow lower cholesterol, diabetic diet, with wt overall stable,  but little exercise however. Also mentions GI upset/nausea recurrent for up to 6 mo, hard to be exact.  Last colonoscopy sept 2013 with polyp, f/u 5 yrs, Dr Juanda Chance.   CT abd 2011 with signficant stool burden.  Has f/u appt with GI - Dr Juanda Chance feb 2014, no prior OV so far.  Has not tried antacids or prilosec.  No vomiting, fever, "empty feeling" and weakness not better eating,  And wt essentially little change from 213 July last yr to current 211.  Denies worsening reflux or regurg, abd pain, dysphagia,  bowel change or blood., but has some yellowish color on occasion to stool.   Denies urinary symptoms such as dysuria, frequency, urgency, flank pain, hematuria or n/v, fever, chills, but mentions low volume urine, wife noted 4 times urination yesterday, small ampounts only.  Not taking Gala Murdoch, not sure if helped.  Past Medical History  Diagnosis Date  . Hypertension   . Dementia   . Stroke   . Hyperlipidemia   . Schizophrenia   . HTN (hypertension)   . Vitamin B12 deficiency   . BPH (benign prostatic hyperplasia) 02/01/2012  . Depression 06/22/2012  . GERD (gastroesophageal reflux disease) 06/22/2012  . Fatty liver 06/22/2012  . Anxiety   . Skin cancer of nose   . Rectal polyp   . Chronic headaches 06/22/2012  . Glucose intolerance (impaired glucose tolerance) 06/27/2012  . Bipolar affective disorder 06/27/2012  .  Dementia   . Weakness of both lower limbs     and some weakness in arms  . Occasional tremors     leg tremors  . Tubular adenoma of colon 2013   Past Surgical History  Procedure Date  . Rectal polypectomy Nov 02, 2000    Dr Abbey Chatters  . Skin cancer excision     on nose  . Deep leg / ankle tumor excision     upper left thigh    reports that he has been smoking Cigarettes.  He has been smoking about .25 packs per day for the past 0 years. He has never used smokeless tobacco. He reports that he does not drink alcohol or use illicit drugs. family history includes Dementia in his father and mother.  There is no history of Colon cancer, and Colon polyps, and Rectal cancer, and Stomach cancer, . No Known Allergies Current Outpatient Prescriptions on File Prior to Visit  Medication Sig Dispense Refill  . ALPRAZolam (XANAX) 0.5 MG tablet Take 0.5 mg by mouth as needed.      Marland Kitchen amLODipine (NORVASC) 5 MG tablet Take 1 tablet (5 mg total) by mouth daily.  30 tablet  0  . aspirin 81 MG EC tablet Take 81 mg by mouth daily.        Marland Kitchen donepezil (ARICEPT) 10 MG tablet Take 10 mg by mouth 1 day or 1 dose.        Marland Kitchen  fesoterodine (TOVIAZ) 8 MG TB24 Take 1 tablet (8 mg total) by mouth daily.  90 tablet  3  . lisinopril (PRINIVIL,ZESTRIL) 40 MG tablet Take 40 mg by mouth daily.       . Omega-3 Fatty Acids (FISH OIL) 1200 MG CAPS Take 1 capsule by mouth 2 (two) times daily.      . polyethylene glycol powder (GLYCOLAX/MIRALAX) powder Take 17 g by mouth daily.  255 g  3  . promethazine (PHENERGAN) 25 MG tablet Take 0.5 tablets (12.5 mg total) by mouth every 6 (six) hours as needed for nausea.  30 tablet  0  . tadalafil (CIALIS) 20 MG tablet Take 1 tablet (20 mg total) by mouth daily as needed for erectile dysfunction.  5 tablet  11   Review of Systems  Constitutional: Negative for unexpected weight change, or unusual diaphoresis  HENT: Negative for tinnitus.   Eyes: Negative for photophobia and visual  disturbance.  Respiratory: Negative for choking and stridor.   Genitourinary: Negative for hematuria and decreased urine volume.  Musculoskeletal: Negative for acute joint swelling Skin: Negative for color change and wound.  Neurological: Negative for tremors and numbness other than noted  Psychiatric/Behavioral: Negative for decreased concentration or  hyperactivity.       Objective:   Physical Exam BP 132/90  Pulse 76  Temp 98.1 F (36.7 C) (Oral)  Ht 6' (1.829 m)  Wt 211 lb 6 oz (95.879 kg)  BMI 28.67 kg/m2  SpO2 95% VS noted,  Constitutional: Pt appears well-developed and well-nourished.  HENT: Head: NCAT.  Right Ear: External ear normal.  Left Ear: External ear normal.  Eyes: Conjunctivae and EOM are normal. Pupils are equal, round, and reactive to light.  Neck: Normal range of motion. Neck supple.  Cardiovascular: Normal rate and regular rhythm.   Pulmonary/Chest: Effort normal and breath sounds normal.  Abd:  Soft, NT, non-distended, + BS except mild epigastric tender Neurological: Pt is alert. Not confused  Skin: Skin is warm. No erythema.  Psychiatric: Pt behavior is normal. Thought content normal.     Assessment & Plan:

## 2012-12-24 NOTE — Assessment & Plan Note (Signed)
Due for psa, will call for urology record, last seen 2012 or 2013, wife not sure about need for f/u, and urine study today, also trial flomax  .4 qd, ok to stay off toviaz as trial did not seem to help

## 2012-12-28 ENCOUNTER — Encounter: Payer: Self-pay | Admitting: Internal Medicine

## 2012-12-28 NOTE — Assessment & Plan Note (Signed)
stable overall by history and exam, recent data reviewed with pt, and pt to continue medical treatment as before,  to f/u any worsening symptoms or concerns Lab Results  Component Value Date   LDLCALC 122* 02/01/2012

## 2012-12-28 NOTE — Assessment & Plan Note (Signed)
For b12 level

## 2012-12-28 NOTE — Assessment & Plan Note (Signed)
.  stable overall by history and exam, recent data reviewed with pt, and pt to continue medical treatment as before,  to f/u any worsening symptoms or concerns BP Readings from Last 3 Encounters:  12/24/12 132/90  08/28/12 165/97  06/27/12 132/90

## 2012-12-30 ENCOUNTER — Ambulatory Visit: Payer: Medicare Other | Admitting: Internal Medicine

## 2013-01-16 ENCOUNTER — Ambulatory Visit: Payer: Medicare Other | Admitting: Internal Medicine

## 2013-06-03 ENCOUNTER — Encounter: Payer: Self-pay | Admitting: Internal Medicine

## 2013-06-10 ENCOUNTER — Encounter: Payer: Self-pay | Admitting: Internal Medicine

## 2013-06-10 ENCOUNTER — Ambulatory Visit (INDEPENDENT_AMBULATORY_CARE_PROVIDER_SITE_OTHER): Payer: Medicare Other | Admitting: Internal Medicine

## 2013-06-10 ENCOUNTER — Ambulatory Visit (INDEPENDENT_AMBULATORY_CARE_PROVIDER_SITE_OTHER)
Admission: RE | Admit: 2013-06-10 | Discharge: 2013-06-10 | Disposition: A | Discharge status: Home / Self Care | Payer: Medicare Other | Source: Ambulatory Visit | Attending: Internal Medicine | Admitting: Internal Medicine

## 2013-06-10 ENCOUNTER — Other Ambulatory Visit (INDEPENDENT_AMBULATORY_CARE_PROVIDER_SITE_OTHER): Payer: Medicare Other

## 2013-06-10 VITALS — BP 142/80 | HR 81 | Temp 98.2°F | Ht 72.0 in | Wt 211.0 lb

## 2013-06-10 DIAGNOSIS — R609 Edema, unspecified: Secondary | ICD-10-CM

## 2013-06-10 DIAGNOSIS — R079 Chest pain, unspecified: Secondary | ICD-10-CM | POA: Insufficient documentation

## 2013-06-10 DIAGNOSIS — R600 Localized edema: Secondary | ICD-10-CM | POA: Insufficient documentation

## 2013-06-10 DIAGNOSIS — R1013 Epigastric pain: Secondary | ICD-10-CM

## 2013-06-10 LAB — BASIC METABOLIC PANEL
BUN: 11 mg/dL (ref 6–23)
CO2: 28 mEq/L (ref 19–32)
Chloride: 102 mEq/L (ref 96–112)
Creatinine, Ser: 0.9 mg/dL (ref 0.4–1.5)
Glucose, Bld: 109 mg/dL — ABNORMAL HIGH (ref 70–99)

## 2013-06-10 LAB — CBC WITH DIFFERENTIAL/PLATELET
Basophils Relative: 0.6 % (ref 0.0–3.0)
Eosinophils Relative: 0.4 % (ref 0.0–5.0)
Lymphocytes Relative: 10.4 % — ABNORMAL LOW (ref 12.0–46.0)
Neutrophils Relative %: 83.8 % — ABNORMAL HIGH (ref 43.0–77.0)
RBC: 5.21 Mil/uL (ref 4.22–5.81)
WBC: 10 10*3/uL (ref 4.5–10.5)

## 2013-06-10 LAB — HEPATIC FUNCTION PANEL
ALT: 17 U/L (ref 0–53)
Albumin: 4.5 g/dL (ref 3.5–5.2)
Total Bilirubin: 0.8 mg/dL (ref 0.3–1.2)
Total Protein: 7.4 g/dL (ref 6.0–8.3)

## 2013-06-10 MED ORDER — ONDANSETRON HCL 4 MG PO TABS: 4.0000 mg | ORAL_TABLET | Freq: Three times a day (TID) | ORAL | Status: DC | PRN

## 2013-06-10 MED ORDER — PANTOPRAZOLE SODIUM 40 MG PO TBEC: 40.0000 mg | DELAYED_RELEASE_TABLET | Freq: Two times a day (BID) | ORAL | Status: DC

## 2013-06-10 NOTE — Assessment & Plan Note (Addendum)
Atypical - ? GI vs card vs other - ECG reviewed as per emr, for cxr, stress test, refer card  Note:  Total time for pt hx, exam, review of record with pt in the room, determination of diagnoses and plan for further eval and tx is > 40 min, with over 50% spent in coordination and counseling of patient

## 2013-06-10 NOTE — Assessment & Plan Note (Signed)
No wt loss, vomiting, but persistent - for increased PPI to bid, zofran prn, refer GI - may need EGD

## 2013-06-10 NOTE — Progress Notes (Signed)
Subjective:    Patient ID: [name], male    DOB: [birth year], 67 y.o.   MRN: [id number]  HPI Difficult hx due to ?dementia/bipolar/shizophrenia - here with wife, who states "somethings going on they havent found yet.: with general weakness, nausea (pt main complaint) and new onset worsening x 3 days swelling to legs to a few inches above the ankles bilat per wife (not noticed before), with some redness to bottom of foot and purple areas to medial right ankles and prox dorsal left foot below the ankles.  No trauma, fever.  Has mult LE varicosities.  Does not push fluids per wife. No increased salt or diet change recently.  Pt denies increased sob or doe, wheezing, orthopnea, PND, palpitations, dizziness or syncope, but has had intermittent left sided CP 1-2 times per months for several months, not better with Tums, seems "sore" and "hard to explain." "feels like something laying on it."  Not pleuritic and not exertional, just feels "stressed a little bit."  Wife has been trying to have him more active lately, did try a walk but he turned weak and gray after 1/2 mild and had to sit, had to call son to drive him home.   Pt denies fever, wt loss, night sweats, loss of appetite, or other constitutional symptoms  Saw Urology PA yesterday with apparent UTi - on nitrofurantoin for 10 days. Past Medical History  Diagnosis Date  . Hypertension   . Dementia   . Stroke   . Hyperlipidemia   . Schizophrenia   . HTN (hypertension)   . Vitamin B12 deficiency   . BPH (benign prostatic hyperplasia) 02/01/2012  . Depression 06/22/2012  . GERD (gastroesophageal reflux disease) 06/22/2012  . Fatty liver 06/22/2012  . Anxiety   . Skin cancer of nose   . Rectal polyp   . Chronic headaches 06/22/2012  . Glucose intolerance (impaired glucose tolerance) 06/27/2012  . Bipolar affective disorder 06/27/2012  . Dementia   . Weakness of both lower limbs     and some weakness in arms  . Occasional tremors     leg tremors   . Tubular adenoma of colon 2013   Past Surgical History  Procedure Laterality Date  . Rectal polypectomy  Nov 02, 2000    Dr Abbey Chatters  . Skin cancer excision      on nose  . Deep leg / ankle tumor excision      upper left thigh    reports that he has been smoking Cigarettes.  He has been smoking about 0.25 packs per day for the past 0 years. He has never used smokeless tobacco. He reports that he does not drink alcohol or use illicit drugs. family history includes Dementia in his father and mother.  There is no history of Colon cancer, and Colon polyps, and Rectal cancer, and Stomach cancer, . No Known Allergies Current Outpatient Prescriptions on File Prior to Visit  Medication Sig Dispense Refill  . ALPRAZolam (XANAX) 0.5 MG tablet Take 1 tablet (0.5 mg total) by mouth daily as needed.  30 tablet  1  . amLODipine (NORVASC) 5 MG tablet Take 1 tablet (5 mg total) by mouth daily.  90 tablet  3  . aspirin 81 MG EC tablet Take 81 mg by mouth daily.        Marland Kitchen atorvastatin (LIPITOR) 10 MG tablet Take 1 tablet (10 mg total) by mouth daily.  90 tablet  3  . cephALEXin (KEFLEX) 500 MG capsule Take 1  capsule (500 mg total) by mouth 4 (four) times daily.  40 capsule  0  . donepezil (ARICEPT) 10 MG tablet Take 1 tablet (10 mg total) by mouth 1 day or 1 dose.  90 tablet  3  . lisinopril (PRINIVIL,ZESTRIL) 40 MG tablet Take 1 tablet (40 mg total) by mouth daily.  90 tablet  3  . Omega-3 Fatty Acids (FISH OIL) 1200 MG CAPS Take 1 capsule by mouth 2 (two) times daily.      . ondansetron (ZOFRAN) 4 MG tablet Take 1 tablet (4 mg total) by mouth every 8 (eight) hours as needed for nausea.  30 tablet  0  . pantoprazole (PROTONIX) 40 MG tablet Take 1 tablet (40 mg total) by mouth daily.  90 tablet  3  . polyethylene glycol powder (GLYCOLAX/MIRALAX) powder Take 17 g by mouth daily.  255 g  3  . sildenafil (VIAGRA) 100 MG tablet Take 0.5-1 tablets (50-100 mg total) by mouth daily as needed for erectile  dysfunction.  5 tablet  11  . Tamsulosin HCl (FLOMAX) 0.4 MG CAPS Take 1 capsule (0.4 mg total) by mouth daily.  90 capsule  3  . tadalafil (CIALIS) 20 MG tablet Take 1 tablet (20 mg total) by mouth daily as needed for erectile dysfunction.  5 tablet  11   No current facility-administered medications on file prior to visit.     Review of Systems  Constitutional: Negative for unexpected weight change, or unusual diaphoresis  HENT: Negative for tinnitus.   Eyes: Negative for photophobia and visual disturbance.  Respiratory: Negative for choking and stridor.   Gastrointestinal: Negative for vomiting and blood in stool.  Genitourinary: Negative for hematuria and decreased urine volume.  Musculoskeletal: Negative for acute joint swelling Skin: Negative for color change and wound.  Neurological: Negative for tremors and numbness other than noted  Psychiatric/Behavioral: Negative for decreased concentration or  hyperactivity.       Objective:   Physical Exam BP 142/80  Pulse 81  Temp(Src) 98.2 F (36.8 C) (Oral)  Ht 6' (1.829 m)  Wt 211 lb (95.709 kg)  BMI 28.61 kg/m2  SpO2 95% VS noted,  Constitutional: Pt appears well-developed and well-nourished.  HENT: Head: NCAT.  Right Ear: External ear normal.  Left Ear: External ear normal.  Eyes: Conjunctivae and EOM are normal. Pupils are equal, round, and reactive to light.  Neck: Normal range of motion. Neck supple.  Cardiovascular: Normal rate and regular rhythm.   Pulmonary/Chest: Effort normal and breath sounds normal. - no rales or wheezing  Abd:  Soft, NT, non-distended, + BS Neurological: Pt is alert. Not confused  Skin: Skin is warm. No erythema. trace edema to 2 cm above ankle bilat, numerous LE varicosities noted bilat Psychiatric: Pt behavior is normal. Thought content normal.     Assessment & Plan:

## 2013-06-10 NOTE — Assessment & Plan Note (Signed)
Very mild but new, suspect venous insufficincy, consider echo, but hold for now diuretic pending further testing

## 2013-06-10 NOTE — Patient Instructions (Addendum)
Your EKG was OK today Please take all new medication as prescribed - the generic zofran for nausea, and the increased protonix to twice per day Please continue all other medications as before, and refills have been done if requested. Please go to the XRAY Department in the Basement (go straight as you get off the elevator) for the x-ray testing Please go to the LAB in the Basement (turn left off the elevator) for the tests to be done today Please remember to sign up for My Chart if you have not done so, as this will be important to you in the future with finding out test results, communicating by private email, and scheduling acute appointments online when needed.  You will be contacted regarding the referral for: Gastroenterology, and Cardiology, and Stress testing

## 2013-06-18 ENCOUNTER — Ambulatory Visit (HOSPITAL_COMMUNITY): Payer: Medicare Other | Attending: Internal Medicine | Admitting: Radiology

## 2013-06-18 VITALS — BP 114/88 | HR 65 | Ht 72.0 in | Wt 202.0 lb

## 2013-06-18 DIAGNOSIS — R0989 Other specified symptoms and signs involving the circulatory and respiratory systems: Secondary | ICD-10-CM | POA: Insufficient documentation

## 2013-06-18 DIAGNOSIS — R42 Dizziness and giddiness: Secondary | ICD-10-CM | POA: Insufficient documentation

## 2013-06-18 DIAGNOSIS — R5381 Other malaise: Secondary | ICD-10-CM | POA: Insufficient documentation

## 2013-06-18 DIAGNOSIS — R079 Chest pain, unspecified: Secondary | ICD-10-CM

## 2013-06-18 DIAGNOSIS — R110 Nausea: Secondary | ICD-10-CM | POA: Insufficient documentation

## 2013-06-18 DIAGNOSIS — R0602 Shortness of breath: Secondary | ICD-10-CM

## 2013-06-18 DIAGNOSIS — R0609 Other forms of dyspnea: Secondary | ICD-10-CM | POA: Insufficient documentation

## 2013-06-18 MED ORDER — TECHNETIUM TC 99M SESTAMIBI GENERIC - CARDIOLITE
10.0000 | Freq: Once | INTRAVENOUS | Status: AC | PRN
  Administered 2013-06-18: 10 via INTRAVENOUS

## 2013-06-18 MED ORDER — TECHNETIUM TC 99M SESTAMIBI GENERIC - CARDIOLITE
30.0000 | Freq: Once | INTRAVENOUS | Status: AC | PRN
  Administered 2013-06-18: 30 via INTRAVENOUS

## 2013-06-18 MED ORDER — AMINOPHYLLINE 25 MG/ML IV SOLN
150.0000 mg | Freq: Two times a day (BID) | INTRAVENOUS | Status: DC | PRN
  Administered 2013-06-18: 150 mg via INTRAVENOUS

## 2013-06-18 MED ORDER — REGADENOSON 0.4 MG/5ML IV SOLN
0.4000 mg | Freq: Once | INTRAVENOUS | Status: AC
  Administered 2013-06-18: 0.4 mg via INTRAVENOUS

## 2013-06-18 NOTE — Progress Notes (Signed)
[facility] SITE 3 NUCLEAR MED [address] [place], [place] [address] [phone]    Cardiology Nuclear Med Study  [name] is a 67 y.o. male     MRN : [id number]     DOB: 29-Jun-1946  Procedure Date: 06/18/2013  Nuclear Med Background Indication for Stress Test:  Evaluation for Ischemia History:  '13 Echo:EF=65% Cardiac Risk Factors: CVA, Hypertension, Lipids and Smoker  Symptoms:  Chest Pain with and without Exertion, Diaphoresis, Dizziness, DOE, Fatigue with and without Exertion, Light-Headedness and Nausea   Nuclear Pre-Procedure Caffeine/Decaff Intake:  None> 12 hrs NPO After: 9:00pm   Lungs:  Clear. O2 Sat: 94% on room air. IV 0.9% NS with Angio Cath:  22g  IV Site: R Antecubital x 1, tolerated well IV Started by:  Irean Hong, RN  Chest Size (in):  42 Cup Size: n/a  Height: 6' (1.829 m)  Weight:  202 lb (91.627 kg)  BMI:  Body mass index is 27.39 kg/(m^2). Tech Comments: Patient took his xanax on arrival.    Nuclear Med Study 1 or 2 day study: 1 day  Stress Test Type:  Eugenie Birks  Reading MD: Marca Ancona, MD  Order Authorizing Provider:  Oliver Barre, MD  Resting Radionuclide: Technetium 74m Sestamibi  Resting Radionuclide Dose: 11.0 mCi   Stress Radionuclide:  Technetium 35m Sestamibi  Stress Radionuclide Dose: 33.0 mCi           Stress Protocol Rest HR: 65 Stress HR: 98  Rest BP: 114/88 Stress BP: 131/85  Exercise Time (min): n/a METS: n/a   Predicted Max HR: 153 bpm % Max HR: 64.05 bpm Rate Pressure Product: 21308   Dose of Adenosine (mg):  n/a Dose of Lexiscan: 0.4 mg Dose of Aminophylline:75 mg  Dose of Atropine (mg): n/a Dose of Dobutamine: n/a mcg/kg/min (at max HR)  Stress Test Technologist: Smiley Houseman, CMA-N  Nuclear Technologist:  Harlow Asa, CNMT     Rest Procedure:  Myocardial perfusion imaging was performed at rest 45 minutes following the intravenous administration of Technetium 49m Sestamibi.  Rest ECG: NSR -  Normal EKG  Stress Procedure:  The patient received IV Lexiscan 0.4 mg over 15-seconds.  Technetium 57m Sestamibi injected at 30-seconds.  He c/o shortness of breath, nausea, leg weakness and headache with Lexiscan; that was relieved with Aminophylline 75 mg IV.  Quantitative spect images were obtained after a 45 minute delay.  Stress ECG: No significant change from baseline ECG  QPS Raw Data Images:  There was significant patient motion with stress and rest.  Stress Images:  Small to medium-sized, mild basal inferior perfusion defect. Rest Images:  Medium-sized, severe basal to mid inferior perfusion defect. Subtraction (SDS):  There is a basal to mid inferior perfusion defect, worse at rest than with stress. Transient Ischemic Dilatation (Normal <1.22):  NA Lung/Heart Ratio (Normal <0.45):  0.47  Quantitative Gated Spect Images QGS EDV:  108 ml QGS ESV:  29 ml  Impression Exercise Capacity:  Lexiscan with no exercise. BP Response:  Hypotensive blood pressure response. Clinical Symptoms:  Short of breath, weak ECG Impression:  No significant ST segment change suggestive of ischemia. Comparison with Prior Nuclear Study: No previous nuclear study performed  Overall Impression:  Low risk stress test.  There was significant patient motion.  There was a basal to mid inferior perfusion defect at rest and with stress, the defect was actually worse with stress.  No ischemia, cannot rule out basal inferior infarction but possible artifact  given normal wall motion.   LV Ejection Fraction: 73%.  LV Wall Motion:  NL LV Function; NL Wall Motion  Marca Ancona 06/18/2013

## 2013-06-19 ENCOUNTER — Telehealth: Payer: Self-pay

## 2013-06-19 NOTE — Telephone Encounter (Signed)
Stress test neg  Appendix is on the RIGHT lower abdomen

## 2013-06-19 NOTE — Telephone Encounter (Signed)
Consider ROV, can see Rene Kocher or me or other MD if needed

## 2013-06-19 NOTE — Telephone Encounter (Signed)
The patients wife spoke to the patient and his pain is coming from his right  Side not the left.

## 2013-06-19 NOTE — Telephone Encounter (Signed)
Patient's wife informed

## 2013-06-19 NOTE — Telephone Encounter (Signed)
The patients wife is requesting results from stress test yesterday.  Also stated on left side he is having pain and has his appendix been checked??

## 2013-06-20 NOTE — Telephone Encounter (Signed)
The wife informed and did schedule

## 2013-06-25 ENCOUNTER — Ambulatory Visit: Payer: Medicare Other | Admitting: Internal Medicine

## 2013-07-07 ENCOUNTER — Ambulatory Visit: Payer: Medicare Other | Admitting: Internal Medicine

## 2013-07-16 ENCOUNTER — Telehealth: Payer: Self-pay | Admitting: Internal Medicine

## 2013-07-16 ENCOUNTER — Encounter: Payer: Self-pay | Admitting: Cardiovascular Disease

## 2013-07-16 ENCOUNTER — Ambulatory Visit (INDEPENDENT_AMBULATORY_CARE_PROVIDER_SITE_OTHER): Payer: Medicare Other | Admitting: Cardiovascular Disease

## 2013-07-16 VITALS — BP 155/94 | HR 68 | Wt 206.0 lb

## 2013-07-16 DIAGNOSIS — I10 Essential (primary) hypertension: Secondary | ICD-10-CM

## 2013-07-16 DIAGNOSIS — E785 Hyperlipidemia, unspecified: Secondary | ICD-10-CM

## 2013-07-16 DIAGNOSIS — F0390 Unspecified dementia without behavioral disturbance: Secondary | ICD-10-CM

## 2013-07-16 NOTE — Assessment & Plan Note (Signed)
Cholesterol is at goal.  Continue current dose of statin and diet Rx.  No myalgias or side effects.  F/U  LFT's in 6 months. Lab Results  Component Value Date   LDLCALC 122* 02/01/2012

## 2013-07-16 NOTE — Telephone Encounter (Signed)
Spoke with patient's wife and she states patient is having nausea. She does not want to schedule until October for visit. Scheduled on 09/05/13 at 1:45 pm with Dr. Juanda Chance.

## 2013-07-16 NOTE — Progress Notes (Signed)
Patient ID: [name], male   DOB: Sep 15, 1946, 67 y.o.   MRN: [id number] 67 yo referred by Dr Jonny Ruiz for chest pain.  He has schizophrenia and dementia and history is difficult.Has had intermittent left sided CP 1-2 times per months for several months, not better with Tums, seems "sore" and "hard to explain." "feels like something laying on it." Not pleuritic and not exertional, just feels "stressed a little bit." Wife has been trying to have him more active lately, did try a walk but he turned weak and gray after 1/2 mild and had to sit, had to call son to drive him home  Dyspnea with exertion Smokes 1/2 ppd.  Reviewed myovue done   06/18/13 Overall Impression: Low risk stress test. There was significant patient motion. There was a basal to mid inferior perfusion defect at rest and with stress, the defect was actually worse with stress. No ischemia, cannot rule out basal inferior infarction but possible artifact given normal wall motion.  LV Ejection Fraction: 73%. LV Wall Motion: NL LV Function; NL Wall Motion  Essentially normal study  His pains are more muscular sounding and center on his neck and shoulders  ROS: Denies fever, malais, weight loss, blurry vision, decreased visual acuity, cough, sputum, SOB, hemoptysis, pleuritic pain, palpitaitons, heartburn, abdominal pain, melena, lower extremity edema, claudication, or rash.  All other systems reviewed and negative   General: Affect appropriate Tremor in right arm HEENT: normal Neck supple with no adenopathy JVP normal no bruits no thyromegaly Lungs clear with no wheezing and good diaphragmatic motion Heart:  S1/S2 no murmur,rub, gallop or click PMI normal Abdomen: benighn, BS positve, no tenderness, no AAA no bruit.  No HSM or HJR Distal pulses intact with no bruits No edema Neuro non-focal Skin warm and dry No muscular weakness  Medications Current Outpatient Prescriptions  Medication Sig Dispense Refill  . ALPRAZolam (XANAX)  0.5 MG tablet Take 1 tablet (0.5 mg total) by mouth daily as needed.  30 tablet  1  . amLODipine (NORVASC) 5 MG tablet Take 1 tablet (5 mg total) by mouth daily.  90 tablet  3  . aspirin 81 MG EC tablet Take 81 mg by mouth daily.        Marland Kitchen donepezil (ARICEPT) 10 MG tablet Take 1 tablet (10 mg total) by mouth 1 day or 1 dose.  90 tablet  3  . lisinopril (PRINIVIL,ZESTRIL) 40 MG tablet Take 1 tablet (40 mg total) by mouth daily.  90 tablet  3  . Omega-3 Fatty Acids (FISH OIL) 1200 MG CAPS Take 1 capsule by mouth 2 (two) times daily.      . ondansetron (ZOFRAN) 4 MG tablet Take 1 tablet (4 mg total) by mouth every 8 (eight) hours as needed for nausea.  30 tablet  0  . Tamsulosin HCl (FLOMAX) 0.4 MG CAPS Take 1 capsule (0.4 mg total) by mouth daily.  90 capsule  3   No current facility-administered medications for this visit.    Allergies Review of patient's allergies indicates no known allergies.  Family History: Family History  Problem Relation Age of Onset  . Dementia Father   . Dementia Mother   . Colon cancer Neg Hx   . Colon polyps Neg Hx   . Rectal cancer Neg Hx   . Stomach cancer Neg Hx     Social History: History   Social History  . Marital Status: Married    Spouse Name: N/A    Number of  Children: N/A  . Years of Education: N/A   Occupational History  . Not on file.   Social History Main Topics  . Smoking status: Current Every Day Smoker -- 0.25 packs/day for 0 years    Types: Cigarettes  . Smokeless tobacco: Never Used  . Alcohol Use: No  . Drug Use: No  . Sexual Activity: No   Other Topics Concern  . Not on file   Social History Narrative   Divorced, remarried.  Disabled secondary to psych reasons.    Electrocardiogram:  NSR rate 78 PVC;s flat lateral ST segments  Assessment and Plan

## 2013-07-16 NOTE — Patient Instructions (Signed)
Your physician wants you to follow-up in:  6 MONTHS WITH DR NISHAN  You will receive a reminder letter in the mail two months in advance. If you don't receive a letter, please call our office to schedule the follow-up appointment. Your physician recommends that you continue on your current medications as directed. Please refer to the Current Medication list given to you today. 

## 2013-07-16 NOTE — Assessment & Plan Note (Signed)
Continue aricept No excessive bradycardia

## 2013-07-16 NOTE — Assessment & Plan Note (Signed)
Atypical  Essentially normal myovue  Continue to Rx risk factors and stop smoking.  F/U with me in 6 months No indication for cath

## 2013-07-16 NOTE — Assessment & Plan Note (Signed)
Well controlled.  Continue current medications and low sodium Dash type diet.    

## 2013-07-21 ENCOUNTER — Other Ambulatory Visit: Payer: Self-pay | Admitting: Internal Medicine

## 2013-07-21 NOTE — Telephone Encounter (Signed)
Faxed hardcopy to [facility] [place] 

## 2013-07-21 NOTE — Telephone Encounter (Signed)
Done hardcopy to [facility]  

## 2013-08-19 ENCOUNTER — Other Ambulatory Visit (INDEPENDENT_AMBULATORY_CARE_PROVIDER_SITE_OTHER): Payer: Medicare Other

## 2013-08-19 ENCOUNTER — Ambulatory Visit (INDEPENDENT_AMBULATORY_CARE_PROVIDER_SITE_OTHER): Payer: Medicare Other | Admitting: Internal Medicine

## 2013-08-19 ENCOUNTER — Encounter: Payer: Self-pay | Admitting: Internal Medicine

## 2013-08-19 ENCOUNTER — Other Ambulatory Visit: Payer: Self-pay | Admitting: *Deleted

## 2013-08-19 VITALS — BP 140/88 | HR 72 | Ht 72.0 in | Wt 201.5 lb

## 2013-08-19 DIAGNOSIS — R110 Nausea: Secondary | ICD-10-CM

## 2013-08-19 DIAGNOSIS — R634 Abnormal weight loss: Secondary | ICD-10-CM

## 2013-08-19 DIAGNOSIS — E538 Deficiency of other specified B group vitamins: Secondary | ICD-10-CM

## 2013-08-19 DIAGNOSIS — K7689 Other specified diseases of liver: Secondary | ICD-10-CM

## 2013-08-19 LAB — SEDIMENTATION RATE: Sed Rate: 4 mm/hr (ref 0–22)

## 2013-08-19 LAB — COMPREHENSIVE METABOLIC PANEL
ALT: 19 U/L (ref 0–53)
AST: 23 U/L (ref 0–37)
Albumin: 4.7 g/dL (ref 3.5–5.2)
Alkaline Phosphatase: 87 U/L (ref 39–117)
Potassium: 3.8 mEq/L (ref 3.5–5.1)
Sodium: 141 mEq/L (ref 135–145)
Total Protein: 7.6 g/dL (ref 6.0–8.3)

## 2013-08-19 MED ORDER — CYANOCOBALAMIN 1000 MCG/ML IJ SOLN: INTRAMUSCULAR | Status: DC

## 2013-08-19 NOTE — Progress Notes (Signed)
[name] 07-11-1946 MRN [id number]   History of Present Illness:  This is a 67 year old white male with progressive weight loss of 15 pounds over the last several months. He complains of nausea, decreased appetite and anxiety. He has schizophrenia and dementia for which he saw a neurologist in the past and is currently on Aricept and Remeron. He was recently evaluated by Dr. Eden Emms for chest pain which turned out to be non cardiac. His ejection fraction is 73% and he has normal LV function. He continues to smoke at least a half pack of cigarettes a day. He has not had any vomiting. The wife, who brings him describes patient having frequent yellow stools. We did a colonoscopy in September 2013 which showed a tubular adenoma. A prior colonoscopy was completed in 2001. There has been no fever, jaundice or dysphagia. He has taken Zofran without much response.   Past Medical History  Diagnosis Date  . Hypertension   . Dementia   . Stroke   . Hyperlipidemia   . Schizophrenia   . HTN (hypertension)   . Vitamin B12 deficiency   . BPH (benign prostatic hyperplasia) 02/01/2012  . Depression 06/22/2012  . GERD (gastroesophageal reflux disease) 06/22/2012  . Fatty liver 06/22/2012  . Anxiety   . Skin cancer of nose   . Rectal polyp   . Chronic headaches 06/22/2012  . Glucose intolerance (impaired glucose tolerance) 06/27/2012  . Bipolar affective disorder 06/27/2012  . Dementia   . Weakness of both lower limbs     and some weakness in arms  . Occasional tremors     leg tremors  . Tubular adenoma of colon 2013   Past Surgical History  Procedure Laterality Date  . Rectal polypectomy  Nov 02, 2000    Dr Abbey Chatters  . Skin cancer excision      on nose  . Deep leg / ankle tumor excision      upper left thigh    reports that he has been smoking Cigarettes.  He has been smoking about 0.25 packs per day for the past 0 years. He has never used smokeless tobacco. He reports that he does not drink  alcohol or use illicit drugs. family history includes Dementia in his father and mother. There is no history of Colon cancer, Colon polyps, Rectal cancer, or Stomach cancer. No Known Allergies      Review of Systems: Positive for weight loss of 15 pounds. Negative for rectal bleeding  The remainder of the 10 point ROS is negative except as outlined in H&P   Physical Exam: General appearance  Well developed, in no distress.,unkept, unshaven, appears depressed Eyes- non icteric. HEENT nontraumatic, normocephalic. Mouth no lesions, tongue papillated, no cheilosis. Neck supple without adenopathy, thyroid not enlarged, no carotid bruits, no JVD. Lungs Clear to auscultation bilaterally. Cor normal S1, normal S2, regular rhythm, no murmur,  quiet precordium. Abdomen: Soft nontender with normoactive bowel sounds. No discomfort. No fullness. Liver edge at costal margin. Rectal: Soft yellow Hemoccult-negative stool. Extremities no pedal edema. Skin no lesions. Neurological alert and oriented x 3. Psychological depressed mood and affect. Response to questions appropriately.  Assessment and Plan:  Problem #50 67 year old white male with a gradual moderate weight loss, decreased appetite and chronic nausea without specific localizing symptoms. He has progressive dementia and severe anxiety which may be contributing to his digestive symptoms. He has not had any vomiting or occult GI blood loss. We need to consider occult malignancy or possibly peptic  ulcer disease. We will proceed with a CT scan of the abdomen and pelvis as well as with upper endoscopy. He will begin Prilosec 20 mg daily and we will reduce his Aricept to 5 mg daily to avoid diarrhea. We will also check a metabolic panel, TSH, celiac profile, sedimentation rate iron and B12 levels.  Problem #2 History of adenomatous polyp. His last colonoscopy was in September 2013. He is up-to-date.  Problem #3 Schizophrenia and dementia. He may  need to go back to see his neurologist to adjust his medications to reduce the anxiety, insomnia and shakiness.   08/19/2013 Lina Sar

## 2013-08-19 NOTE — Patient Instructions (Addendum)
You have been scheduled for an endoscopy with propofol. Please follow written instructions given to you at your visit today. If you use inhalers (even only as needed), please bring them with you on the day of your procedure. Your physician has requested that you go to www.startemmi.com and enter the access code given to you at your visit today. This web site gives a general overview about your procedure. However, you should still follow specific instructions given to you by our office regarding your preparation for the procedure.  Your physician has requested that you go to the basement for the following lab work before leaving today: TSH, CMET, B12, IBC, CBC, Sed Rate, Celiac 10  You have been scheduled for a CT scan of the abdomen and pelvis at [facility] CT ([address]---this is in the same building as Architectural technologist).   You are scheduled on Monday 08/25/13 at 10:00 am. You should arrive 15 minutes prior to your appointment time for registration. Please follow the written instructions below on the day of your exam:  WARNING: IF YOU ARE ALLERGIC TO IODINE/X-RAY DYE, PLEASE NOTIFY RADIOLOGY IMMEDIATELY AT [phone]! YOU WILL BE GIVEN A 13 HOUR PREMEDICATION PREP.  1) Do not eat or drink anything after 6:00 am (4 hours prior to your test) 2) You have been given 2 bottles of oral contrast to drink. The solution may taste better if refrigerated, but do NOT add ice or any other liquid to this solution. Shake well before drinking.    Drink 1 bottle of contrast @ 8:00 am (2 hours prior to your exam)  Drink 1 bottle of contrast @ 9:00 am (1 hour prior to your exam)  You may take any medications as prescribed with a small amount of water except for the following: Metformin, Glucophage, Glucovance, Avandamet, Riomet, Fortamet, Actoplus Met, Janumet, Glumetza or Metaglip. The above medications must be held the day of the exam AND 48 hours after the exam.  The purpose of you drinking  the oral contrast is to aid in the visualization of your intestinal tract. The contrast solution may cause some diarrhea. Before your exam is started, you will be given a small amount of fluid to drink. Depending on your individual set of symptoms, you may also receive an intravenous injection of x-ray contrast/dye. Plan on being at [facility] for 30 minutes or long, depending on the type of exam you are having performed.  This test typically takes 30-45 minutes to complete.  If you have any questions regarding your exam or if you need to reschedule, you may call the CT department at [phone] between the hours of 8:00 am and 5:00 pm, Monday-Friday.  ________________________________________________________________________ CC: Dr Oliver Barre, Dr Eden Emms

## 2013-08-20 LAB — CELIAC PANEL 10
Gliadin IgG: 4.3 U/mL (ref <20)
IgA: 221 mg/dL (ref 68–379)
Tissue Transglutaminase Ab, IgA: 2.8 U/mL (ref <20)

## 2013-08-21 ENCOUNTER — Other Ambulatory Visit: Payer: Self-pay | Admitting: *Deleted

## 2013-08-21 ENCOUNTER — Ambulatory Visit (INDEPENDENT_AMBULATORY_CARE_PROVIDER_SITE_OTHER): Payer: Medicare Other | Admitting: Internal Medicine

## 2013-08-21 DIAGNOSIS — E538 Deficiency of other specified B group vitamins: Secondary | ICD-10-CM

## 2013-08-21 MED ORDER — CYANOCOBALAMIN 1000 MCG/ML IJ SOLN
1000.0000 ug | Freq: Once | INTRAMUSCULAR | Status: AC
  Administered 2013-08-21: 1000 ug via INTRAMUSCULAR

## 2013-08-22 ENCOUNTER — Encounter: Payer: Self-pay | Admitting: Internal Medicine

## 2013-08-22 ENCOUNTER — Ambulatory Visit (AMBULATORY_SURGERY_CENTER): Payer: Medicare Other | Admitting: Internal Medicine

## 2013-08-22 VITALS — BP 162/101 | HR 61 | Temp 97.2°F | Resp 14 | Ht 72.0 in | Wt 201.0 lb

## 2013-08-22 DIAGNOSIS — D1330 Benign neoplasm of unspecified part of small intestine: Secondary | ICD-10-CM

## 2013-08-22 DIAGNOSIS — R634 Abnormal weight loss: Secondary | ICD-10-CM

## 2013-08-22 DIAGNOSIS — R112 Nausea with vomiting, unspecified: Secondary | ICD-10-CM

## 2013-08-22 MED ORDER — SODIUM CHLORIDE 0.9 % IV SOLN: 500.0000 mL | INTRAVENOUS | Status: DC

## 2013-08-22 MED ORDER — MEGESTROL ACETATE 625 MG/5ML PO SUSP: ORAL | Status: DC

## 2013-08-22 MED ORDER — MEGESTROL ACETATE 40 MG/ML PO SUSP: 200.0000 mg | Freq: Every day | ORAL | Status: DC

## 2013-08-22 NOTE — Progress Notes (Signed)
  [name] 8:25 AM A/ox3 pleased with MAC, report to [facility]

## 2013-08-22 NOTE — Patient Instructions (Addendum)
Discharge instructions given with verbal understanding. Biopsies taken. Resume previous medications. YOU HAD AN ENDOSCOPIC PROCEDURE TODAY AT THE [facility]: Refer to the procedure report that was given to you for any specific questions about what was found during the examination.  If the procedure report does not answer your questions, please call your gastroenterologist to clarify.  If you requested that your care partner not be given the details of your procedure findings, then the procedure report has been included in a sealed envelope for you to review at your convenience later.  YOU SHOULD EXPECT: Some feelings of bloating in the abdomen. Passage of more gas than usual.  Walking can help get rid of the air that was put into your GI tract during the procedure and reduce the bloating. If you had a lower endoscopy (such as a colonoscopy or flexible sigmoidoscopy) you may notice spotting of blood in your stool or on the toilet paper. If you underwent a bowel prep for your procedure, then you may not have a normal bowel movement for a few days.  DIET: Your first meal following the procedure should be a light meal and then it is ok to progress to your normal diet.  A half-sandwich or bowl of soup is an example of a good first meal.  Heavy or fried foods are harder to digest and may make you feel nauseous or bloated.  Likewise meals heavy in dairy and vegetables can cause extra gas to form and this can also increase the bloating.  Drink plenty of fluids but you should avoid alcoholic beverages for 24 hours.  ACTIVITY: Your care partner should take you home directly after the procedure.  You should plan to take it easy, moving slowly for the rest of the day.  You can resume normal activity the day after the procedure however you should NOT DRIVE or use heavy machinery for 24 hours (because of the sedation medicines used during the test).    SYMPTOMS TO REPORT IMMEDIATELY: A gastroenterologist  can be reached at any hour.  During normal business hours, 8:30 AM to 5:00 PM Monday through Friday, call [phone].  After hours and on weekends, please call the GI answering service at [phone] who will take a message and have the physician on call contact you.   Following upper endoscopy (EGD)  Vomiting of blood or coffee ground material  New chest pain or pain under the shoulder blades  Painful or persistently difficult swallowing  New shortness of breath  Fever of 100F or higher  Black, tarry-looking stools  FOLLOW UP: If any biopsies were taken you will be contacted by phone or by letter within the next 1-3 weeks.  Call your gastroenterologist if you have not heard about the biopsies in 3 weeks.  Our staff will call the home number listed on your records the next business day following your procedure to check on you and address any questions or concerns that you may have at that time regarding the information given to you following your procedure. This is a courtesy call and so if there is no answer at the home number and we have not heard from you through the emergency physician on call, we will assume that you have returned to your regular daily activities without incident.  SIGNATURES/CONFIDENTIALITY: You and/or your care partner have signed paperwork which will be entered into your electronic medical record.  These signatures attest to the fact that that the information above on your After   Visit Summary has been reviewed and is understood.  Full responsibility of the confidentiality of this discharge information lies with you and/or your care-partner. 

## 2013-08-22 NOTE — Progress Notes (Signed)
Right eye very red inner corner on admission.

## 2013-08-22 NOTE — Op Note (Signed)
[facility] [address] [place], [address]   ENDOSCOPY PROCEDURE REPORT  PATIENT: [name], [name]  MR#: [id number] BIRTHDATE: 1946/01/21 , 67  yrs. old GENDER: Male ENDOSCOPIST: Hart Carwin, MD REFERRED BY:  Oliver Barre, M.D. PROCEDURE DATE:  08/22/2013 PROCEDURE:  EGD w/ biopsy ASA CLASS:     Class III INDICATIONS:  Nausea. , weight loss, Altzheimers disease MEDICATIONS: MAC sedation, administered by CRNA and Propofol (Diprivan) 130 mg IV TOPICAL ANESTHETIC: Cetacaine Spray  DESCRIPTION OF PROCEDURE: After the risks benefits and alternatives of the procedure were thoroughly explained, informed consent was obtained.  The LB [id number] endoscope was introduced through the mouth and advanced to the second portion of the duodenum. Without limitations.  The instrument was slowly withdrawn as the mucosa was fully examined.      [ Esophagus: esophageal mucosa appeared normal in the proximal mid and distal esophagus. Z line was unremarkable. There was no hiatal hernia Stomach: Gastric mucosa appeared unremarkable. The rugal folds were normal. Retroflexion of the endoscope revealed normal fundus and cardia. Gastric outlet was unremarkable. There was no retained food or secretions Duodenum duodenal bulb and descending duodenum was unremarkable. Multiple biopsies were taken from the second portion duodenum to rule out villous atrophy          The scope was then withdrawn from the patient and the procedure completed.  COMPLICATIONS: There were no complications. ENDOSCOPIC IMPRESSION: essentially normal upper endoscopy of esophagus stomach and duodenum. Status post small bowel biopsies to rule out sprue. Nothing to account for patient's weight loss and nausea RECOMMENDATIONS: Await biopsy results CT scan of the abdomen is pending The patient was found to have a low B12 level each is being supplemented We will consider gastric emptying scan to  assess for gastroparesis Continue with Zofran and PPI Megace elixir 200mg / 5 cc po qd,  REPEAT EXAM: no   eSigned:  Hart Carwin, MD 08/22/2013 8:27 AM   CC:[Carbon Copy]  PATIENT NAME:  [name], [name] MR#: [id number]

## 2013-08-22 NOTE — Progress Notes (Signed)
Called to room to assist during endoscopic procedure.  Patient ID and intended procedure confirmed with present staff. Received instructions for my participation in the procedure from the performing physician.  

## 2013-08-22 NOTE — Progress Notes (Signed)
Patient did not experience any of the following events: a burn prior to discharge; a fall within the facility; wrong site/side/patient/procedure/implant event; or a hospital transfer or hospital admission upon discharge from the facility. (G8907) Patient did not have preoperative order for IV antibiotic SSI prophylaxis. (G8918)  

## 2013-08-25 ENCOUNTER — Ambulatory Visit (INDEPENDENT_AMBULATORY_CARE_PROVIDER_SITE_OTHER)
Admission: RE | Admit: 2013-08-25 | Discharge: 2013-08-25 | Disposition: A | Discharge status: Home / Self Care | Payer: Medicare Other | Source: Ambulatory Visit | Attending: Internal Medicine | Admitting: Internal Medicine

## 2013-08-25 ENCOUNTER — Telehealth: Payer: Self-pay

## 2013-08-25 DIAGNOSIS — R110 Nausea: Secondary | ICD-10-CM

## 2013-08-25 DIAGNOSIS — R634 Abnormal weight loss: Secondary | ICD-10-CM

## 2013-08-25 MED ORDER — IOHEXOL 300 MG/ML  SOLN
100.0000 mL | Freq: Once | INTRAMUSCULAR | Status: AC | PRN
  Administered 2013-08-25: 100 mL via INTRAVENOUS

## 2013-08-25 NOTE — Telephone Encounter (Signed)
  Follow up Call-  Call back number 08/22/2013 08/28/2012  Post procedure Call Back phone  # [phone]  Permission to leave phone message Yes Yes     Patient questions:  Do you have a fever, pain , or abdominal swelling? no Pain Score  0 *  Have you tolerated food without any problems? yes  Have you been able to return to your normal activities? yes  Do you have any questions about your discharge instructions: Diet   no Medications  no Follow up visit  no  Do you have questions or concerns about your Care? no  Actions: * If pain score is 4 or above: No action needed, pain <4.

## 2013-08-26 ENCOUNTER — Telehealth: Payer: Self-pay | Admitting: *Deleted

## 2013-08-26 ENCOUNTER — Encounter: Payer: Self-pay | Admitting: Internal Medicine

## 2013-08-26 NOTE — Telephone Encounter (Signed)
Patient's wife (caregiver) advised that insurance has denied Megace. They are unable to to pay out of pocket for the medication. They have been advised (after speaking with Dr Juanda Chance) to go forward with urology consult to look for further causes of weight loss and we will go from there. She verbalizes understanding.

## 2013-08-26 NOTE — Telephone Encounter (Signed)
After attempting to complete prior authorization for patient's Megace, we have received determination back from [employer] that the medication has been denied. Decision is based on the fact that "drugs when used for anorexia, weight loss, or weight gain are excluded from coverage under Medicare rules..." I have contacted patient's pharmacy who states that out of pocket expense for Megace would be $92.00 per month. Please advise.

## 2013-08-26 NOTE — Telephone Encounter (Signed)
OK, so we need to tell pt's wife about it. I just told Rene Kocher to call her concerning his CT scan results, he needs a urology consult based on those results.,

## 2013-08-29 ENCOUNTER — Ambulatory Visit (INDEPENDENT_AMBULATORY_CARE_PROVIDER_SITE_OTHER): Payer: Medicare Other | Admitting: Internal Medicine

## 2013-08-29 DIAGNOSIS — E538 Deficiency of other specified B group vitamins: Secondary | ICD-10-CM

## 2013-08-29 MED ORDER — CYANOCOBALAMIN 1000 MCG/ML IJ SOLN
1000.0000 ug | Freq: Once | INTRAMUSCULAR | Status: AC
  Administered 2013-08-29: 1000 ug via INTRAMUSCULAR

## 2013-08-29 MED ORDER — CYANOCOBALAMIN 1000 MCG/ML IJ SOLN: 1000.0000 ug | Freq: Once | INTRAMUSCULAR | Status: DC

## 2013-08-29 NOTE — Progress Notes (Signed)
Patient's wife said they had rather come here than her give the B-12's.  They bring the medicine and syringe/needle.

## 2013-09-05 ENCOUNTER — Ambulatory Visit: Payer: Medicare Other | Admitting: Internal Medicine

## 2013-09-05 ENCOUNTER — Other Ambulatory Visit: Payer: Self-pay | Admitting: Urology

## 2013-09-10 ENCOUNTER — Ambulatory Visit: Payer: Medicare Other | Admitting: Internal Medicine

## 2013-09-29 ENCOUNTER — Encounter (HOSPITAL_BASED_OUTPATIENT_CLINIC_OR_DEPARTMENT_OTHER): Payer: Self-pay | Admitting: *Deleted

## 2013-09-29 NOTE — Progress Notes (Signed)
SPOKE W/ PT WIFE, [name].  PT HAS DEMENTIA AND SCHIZOPHRENIA. NPO AFTER MN. ARRIVE AT 0600. NEEDS ISTAT. CURRENT EKG IN EPIC AND CHART. MAY TAKE XANAX AND NAUSEA MED IF NEEDED W/ SIPS OF WATER AM DOS.

## 2013-09-29 NOTE — Progress Notes (Signed)
09/29/13 1243  OBSTRUCTIVE SLEEP APNEA  Have you ever been diagnosed with sleep apnea through a sleep study? No  Do you snore loudly (loud enough to be heard through closed doors)?  0  Do you often feel tired, fatigued, or sleepy during the daytime? 1  Has anyone observed you stop breathing during your sleep? 0  Do you have, or are you being treated for high blood pressure? 1  BMI more than 35 kg/m2? 0  Age over 68 years old? 1  Neck circumference greater than 40 cm/18 inches? 0  Gender: 1  Obstructive Sleep Apnea Score 4  Score 4 or greater  Results sent to PCP

## 2013-10-02 NOTE — H&P (Signed)
History of Present Illness                  LUTS: He indicated that since 6/12 he had been experiencing a decreased force of stream with associated urinary frequency. This is associated with nocturia x2 as well as urgency and occasionally some urge incontinence. He was treated with Cipro for what he reports as 2 months however he continued to complain of voiding symptoms and a vague pelvic pain. He also has tried Flomax. In 8/12 he was noted to have a normal CBC, a creatinine of 0.76 and a PSA of 3.0. The patient and his wife tell me that he has been diagnosed with some form of dementia. Urodynamics 11/12: Bladder overactivity with some outlet obstruction. He was maintained on Flomax and started on anticholinergics.    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 That continues to be a problem for him as well as more recently a decrease in his libido. In addition since starting medication for his dementia he has noted a decrease in his ability to achieve ejaculation.  Elevated PSA: He was found to have a PSA of 4.2 in 1/14. He has been followed and demonstrated variation without significant increase.   Interval history: A CT scan done on 08/25/13 revealed a calcification in the bladder on the right hand side with some adjacent bladder wall thickening.  Was read by the radiologist as either a bladder stone or a densely calcified bladder tumor.  I independently reviewed the CT scan images and it appears that these are most likely bladder calculi however this cannot be determined based solely on the CT scan images. He's experiencing spraying and splitting of his urinary stream and also feelings of incomplete emptying.    IPSS 01/09/13 - 15/terrible    Past Medical History Problems  1. History of  Anxiety (Symptom) 300.00 2. History of  Dementia 294.20 3. History of  Esophageal Reflux 530.81 4. History of  Hypertension 401.9 5. History of  Stroke Syndrome  436  Surgical History Problems  1. History of  Biopsy Skin  Current Meds 1. Aricept 10 MG Oral Tablet; Therapy: (Recorded:25Oct2012) to 2. Hyoscyamine Sulfate 0.125 MG Sublingual Tablet Sublingual; 1BID - DISSOLVE ONE  TABLET UNDER THE TONGUE TWICE DAILY; Therapy: 07May2014 to (Last  Rx:07May2014)  Requested for: 07May2014 3. Lisinopril 40 MG Oral Tablet; Therapy: (Recorded:25Oct2012) to 4. Tamsulosin HCl 0.4 MG Oral Capsule; TAKE 1 CAPSULE Daily; Therapy: 03Jul2014 to  (Evaluate:28Jun2015)  Requested for: 03Jul2014; Last Rx:03Jul2014 5. Vitamin D 16109 UNIT CAPS; Therapy: (Recorded:25Oct2012) to 6. Xanax 0.5 MG Oral Tablet; 1 as needed; Therapy: (Recorded:26Nov2012) to  Allergies Medication  1. No Known Drug Allergies  Family History Problems  1. Family history of  Alzheimer's Disease 2. Family history of  Bone Cancer 3. Family history of  Death In The Family Father 4. Family history of  Death In The Family Mother 5. Family history of  Family Health Status Children ___ Living Sons 1 son 6. Family history of  Prostate Cancer V16.42 7. Family history of  Renal Failure  Social History Problems  1. Caffeine Use 2. Former Smoker V15.82 1ppd 3. Marital History - Currently Married Denied  4. History of  Alcohol Use  Review of Systems Genitourinary, constitutional, skin, eye, otolaryngeal, hematologic/lymphatic, cardiovascular, pulmonary, endocrine, musculoskeletal, gastrointestinal, neurological and psychiatric system(s) were reviewed and pertinent findings if present are noted.  Genitourinary: urinary frequency, feelings of urinary urgency, nocturia, incontinence, difficulty starting  the urinary stream, weak urinary stream, urinary stream starts and stops and erectile dysfunction.  Gastrointestinal: nausea, abdominal pain, heartburn, diarrhea, constipation and melena.  Constitutional: feeling tired (fatigue) and recent weight loss.  Integumentary: skin rash/lesion.  Eyes:  blurred vision.  Hematologic/Lymphatic: a tendency to easily bruise.  Respiratory: cough.  Musculoskeletal: joint pain.  Neurological: headache and dizziness.  Psychiatric: anxiety and depression.    Vitals Vital Signs  BMI Calculated: 27.77 BSA Calculated: 2.15 Height: 6 ft  Weight: 205 lb  Blood Pressure: 162 / 98 Heart Rate: 74  Physical Exam Constitutional: Well nourished and well developed . No acute distress.  ENT:. The ears and nose are normal in appearance.  Neck: The appearance of the neck is normal and no neck mass is present.  Pulmonary: No respiratory distress and normal respiratory rhythm and effort.  Cardiovascular: Heart rate and rhythm are normal . No peripheral edema.  Abdomen: The abdomen is soft and nontender. No masses are palpated. No CVA tenderness. No hernias are palpable. No hepatosplenomegaly noted.  Rectal: Rectal exam demonstrates normal sphincter tone, no tenderness and no masses. Estimated prostate size is 1+. The prostate has no nodularity and is not tender. The left seminal vesicle is nonpalpable. The right seminal vesicle is nonpalpable. The perineum is normal on inspection.  Genitourinary: Examination of the penis demonstrates no discharge, no masses, no lesions and a normal meatus. The penis is circumcised. The scrotum is without lesions. The right epididymis is palpably normal and non-tender. The left epididymis is palpably normal and non-tender. The right testis is non-tender and without masses. The left testis is non-tender and without masses.  Lymphatics: The femoral and inguinal nodes are not enlarged or tender.  Skin: Normal skin turgor, no visible rash and no visible skin lesions.  Neuro/Psych:. Mood and affect are appropriate.     Procedure  Procedure: Cystoscopy   Indication: Bladder calculi versus calcified bladder tumor.  Informed Consent: Risks, benefits, and potential adverse events were discussed and informed consent was obtained from  the patient.  Prep: The patient was prepped with betadine.  Anesthesia:. Local anesthesia was administered intraurethrally with 2% lidocaine jelly.  Procedure Note:  Urethral meatus:. No abnormalities.  Anterior urethra: No abnormalities.  Prostatic urethra: No abnormalities . The lateral prostatic lobes were enlarged.  Bladder: Visulization was clear. The ureteral orifices were in the normal anatomic position bilaterally and had clear efflux of urine. Examination of the bladder demonstrated moderate trabeculation. A single  a stone was present in the bladder. The patient tolerated the procedure well.  Complications: None.    Assessment Assessed     He has a bladder stone and not a tumor with dystrophic calcification. He was pleased here this. His bladder stone has developed due to outlet obstruction and we therefore discussed treatment of the stone with cystolitholapaxy. I discussed this procedure with him in detail including its risks and complications and because he has significant outlet obstruction and enlarged prostate we also discussed outlet reduction surgery. I recommended transurethral incision of his prostate in order to decrease his outlet resistance and result in better voiding with a decreased risk of recurrent bladder calculus formation. I think this would be the best way to manage his outlet resistance and his stone. We discussed the probability of success, the outpatient nature of the procedure and the anticipated postoperative course. He understands and has elected to proceed.   Plan        1. His urine Was cultured, found to  be positive, and he was treated with appropriate antibiotics. 2. He is scheduled for cystolitholapaxy and transurethral incision of his prostate

## 2013-10-03 ENCOUNTER — Ambulatory Visit (HOSPITAL_BASED_OUTPATIENT_CLINIC_OR_DEPARTMENT_OTHER): Payer: Medicare Other | Admitting: Anesthesiology

## 2013-10-03 ENCOUNTER — Encounter (HOSPITAL_BASED_OUTPATIENT_CLINIC_OR_DEPARTMENT_OTHER): Payer: Medicare Other | Admitting: Anesthesiology

## 2013-10-03 ENCOUNTER — Encounter (HOSPITAL_BASED_OUTPATIENT_CLINIC_OR_DEPARTMENT_OTHER): Payer: Self-pay

## 2013-10-03 ENCOUNTER — Encounter (HOSPITAL_BASED_OUTPATIENT_CLINIC_OR_DEPARTMENT_OTHER)
Admission: RE | Disposition: A | Discharge status: Home / Self Care | Payer: Self-pay | Source: Ambulatory Visit | Attending: Urology

## 2013-10-03 ENCOUNTER — Ambulatory Visit (HOSPITAL_BASED_OUTPATIENT_CLINIC_OR_DEPARTMENT_OTHER)
Admission: RE | Admit: 2013-10-03 | Discharge: 2013-10-03 | Disposition: A | Discharge status: Home / Self Care | Payer: Medicare Other | Source: Ambulatory Visit | Attending: Urology | Admitting: Urology

## 2013-10-03 DIAGNOSIS — I10 Essential (primary) hypertension: Secondary | ICD-10-CM | POA: Insufficient documentation

## 2013-10-03 DIAGNOSIS — N529 Male erectile dysfunction, unspecified: Secondary | ICD-10-CM | POA: Insufficient documentation

## 2013-10-03 DIAGNOSIS — N401 Enlarged prostate with lower urinary tract symptoms: Secondary | ICD-10-CM | POA: Insufficient documentation

## 2013-10-03 DIAGNOSIS — K219 Gastro-esophageal reflux disease without esophagitis: Secondary | ICD-10-CM | POA: Insufficient documentation

## 2013-10-03 DIAGNOSIS — F0390 Unspecified dementia without behavioral disturbance: Secondary | ICD-10-CM | POA: Insufficient documentation

## 2013-10-03 DIAGNOSIS — R350 Frequency of micturition: Secondary | ICD-10-CM | POA: Insufficient documentation

## 2013-10-03 DIAGNOSIS — N320 Bladder-neck obstruction: Secondary | ICD-10-CM | POA: Insufficient documentation

## 2013-10-03 DIAGNOSIS — R351 Nocturia: Secondary | ICD-10-CM | POA: Insufficient documentation

## 2013-10-03 DIAGNOSIS — N3941 Urge incontinence: Secondary | ICD-10-CM | POA: Insufficient documentation

## 2013-10-03 DIAGNOSIS — R972 Elevated prostate specific antigen [PSA]: Secondary | ICD-10-CM | POA: Insufficient documentation

## 2013-10-03 DIAGNOSIS — N210 Calculus in bladder: Secondary | ICD-10-CM | POA: Insufficient documentation

## 2013-10-03 DIAGNOSIS — N138 Other obstructive and reflux uropathy: Secondary | ICD-10-CM | POA: Insufficient documentation

## 2013-10-03 DIAGNOSIS — Z8673 Personal history of transient ischemic attack (TIA), and cerebral infarction without residual deficits: Secondary | ICD-10-CM | POA: Insufficient documentation

## 2013-10-03 HISTORY — DX: Weakness: R53.1

## 2013-10-03 HISTORY — DX: Personal history of transient ischemic attack (TIA), and cerebral infarction without residual deficits: Z86.73

## 2013-10-03 HISTORY — PX: CYSTOSCOPY WITH LITHOLAPAXY: SHX1425

## 2013-10-03 HISTORY — DX: Male erectile dysfunction, unspecified: N52.9

## 2013-10-03 HISTORY — DX: Anxiety disorder, unspecified: F41.9

## 2013-10-03 HISTORY — PX: TRANSURETHRAL INCISION OF PROSTATE: SHX2573

## 2013-10-03 HISTORY — PX: HOLMIUM LASER APPLICATION: SHX5852

## 2013-10-03 HISTORY — DX: Personal history of other malignant neoplasm of skin: Z85.828

## 2013-10-03 HISTORY — DX: Other specified degenerative diseases of basal ganglia: G23.8

## 2013-10-03 LAB — POCT I-STAT 4, (NA,K, GLUC, HGB,HCT)
Glucose, Bld: 104 mg/dL — ABNORMAL HIGH (ref 70–99)
HCT: 47 % (ref 39.0–52.0)
Hemoglobin: 16 g/dL (ref 13.0–17.0)
Potassium: 3.9 mEq/L (ref 3.5–5.1)
Sodium: 141 mEq/L (ref 135–145)

## 2013-10-03 SURGERY — INCISION, PROSTATE, TRANSURETHRAL
Anesthesia: General | Site: Prostate | Wound class: Clean Contaminated

## 2013-10-03 MED ORDER — CIPROFLOXACIN IN D5W 400 MG/200ML IV SOLN
400.0000 mg | INTRAVENOUS | Status: AC
  Administered 2013-10-03: 400 mg via INTRAVENOUS
  Filled 2013-10-03: qty 200

## 2013-10-03 MED ORDER — DEXAMETHASONE SODIUM PHOSPHATE 10 MG/ML IJ SOLN
INTRAMUSCULAR | Status: DC | PRN
  Administered 2013-10-03: 10 mg via INTRAVENOUS

## 2013-10-03 MED ORDER — ONDANSETRON HCL 4 MG/2ML IJ SOLN
INTRAMUSCULAR | Status: DC | PRN
  Administered 2013-10-03: 4 mg via INTRAVENOUS

## 2013-10-03 MED ORDER — KETOROLAC TROMETHAMINE 30 MG/ML IJ SOLN
15.0000 mg | Freq: Once | INTRAMUSCULAR | Status: DC | PRN
  Filled 2013-10-03: qty 1

## 2013-10-03 MED ORDER — FENTANYL CITRATE 0.05 MG/ML IJ SOLN
25.0000 ug | INTRAMUSCULAR | Status: DC | PRN
  Filled 2013-10-03: qty 1

## 2013-10-03 MED ORDER — PHENAZOPYRIDINE HCL 200 MG PO TABS
200.0000 mg | ORAL_TABLET | Freq: Once | ORAL | Status: AC
  Administered 2013-10-03: 200 mg via ORAL
  Filled 2013-10-03: qty 1

## 2013-10-03 MED ORDER — PROPOFOL 10 MG/ML IV BOLUS
INTRAVENOUS | Status: DC | PRN
  Administered 2013-10-03: 200 mg via INTRAVENOUS

## 2013-10-03 MED ORDER — PHENAZOPYRIDINE HCL 200 MG PO TABS: 200.0000 mg | ORAL_TABLET | Freq: Three times a day (TID) | ORAL | Status: DC | PRN

## 2013-10-03 MED ORDER — PROMETHAZINE HCL 25 MG/ML IJ SOLN
6.2500 mg | INTRAMUSCULAR | Status: DC | PRN
  Filled 2013-10-03: qty 1

## 2013-10-03 MED ORDER — LACTATED RINGERS IV SOLN
INTRAVENOUS | Status: DC
  Administered 2013-10-03 (×2): via INTRAVENOUS
  Filled 2013-10-03: qty 1000

## 2013-10-03 MED ORDER — SODIUM CHLORIDE 0.9 % IR SOLN
Status: DC | PRN
  Administered 2013-10-03: 6000 mL

## 2013-10-03 MED ORDER — FENTANYL CITRATE 0.05 MG/ML IJ SOLN
INTRAMUSCULAR | Status: DC | PRN
  Administered 2013-10-03: 50 ug via INTRAVENOUS
  Administered 2013-10-03: 25 ug via INTRAVENOUS

## 2013-10-03 MED ORDER — 0.9 % SODIUM CHLORIDE (POUR BTL) OPTIME
TOPICAL | Status: DC | PRN
  Administered 2013-10-03: 1000 mL

## 2013-10-03 MED ORDER — HYDRALAZINE HCL 20 MG/ML IJ SOLN
5.0000 mg | INTRAMUSCULAR | Status: DC | PRN
  Administered 2013-10-03: 5 mg via INTRAVENOUS
  Filled 2013-10-03: qty 0.25

## 2013-10-03 MED ORDER — TAMSULOSIN HCL 0.4 MG PO CAPS
0.4000 mg | ORAL_CAPSULE | Freq: Once | ORAL | Status: AC
  Administered 2013-10-03: 0.4 mg via ORAL
  Filled 2013-10-03: qty 1

## 2013-10-03 MED ORDER — HYDROCODONE-ACETAMINOPHEN 7.5-325 MG PO TABS: 1.0000 | ORAL_TABLET | ORAL | Status: DC | PRN

## 2013-10-03 MED ORDER — STERILE WATER FOR IRRIGATION IR SOLN
Status: DC | PRN
  Administered 2013-10-03: 1000 mL

## 2013-10-03 SURGICAL SUPPLY — 61 items
ADAPTER CATH URET PLST 4-6FR (CATHETERS) IMPLANT
ADPR CATH URET STRL DISP 4-6FR (CATHETERS)
BAG DRAIN URO-CYSTO SKYTR STRL (DRAIN) ×3 IMPLANT
BAG DRN ANRFLXCHMBR STRAP LEK (BAG)
BAG DRN UROCATH (DRAIN) ×2
BAG URINE DRAINAGE (UROLOGICAL SUPPLIES) IMPLANT
BAG URINE LEG 19OZ MD ST LTX (BAG) IMPLANT
BASKET LASER NITINOL 1.9FR (BASKET) IMPLANT
BASKET STNLS GEMINI 4WIRE 3FR (BASKET) IMPLANT
BASKET ZERO TIP NITINOL 2.4FR (BASKET) IMPLANT
BRUSH URET BIOPSY 3F (UROLOGICAL SUPPLIES) IMPLANT
BSKT STON RTRVL 120 1.9FR (BASKET)
BSKT STON RTRVL GEM 120X11 3FR (BASKET)
BSKT STON RTRVL ZERO TP 2.4FR (BASKET)
CANISTER SUCT LVC 12 LTR MEDI- (MISCELLANEOUS) ×2 IMPLANT
CATH FOLEY 2WAY SLVR  5CC 20FR (CATHETERS)
CATH FOLEY 2WAY SLVR  5CC 22FR (CATHETERS)
CATH FOLEY 2WAY SLVR  5CC 24FR (CATHETERS) ×1
CATH FOLEY 2WAY SLVR 5CC 20FR (CATHETERS) IMPLANT
CATH FOLEY 2WAY SLVR 5CC 22FR (CATHETERS) IMPLANT
CATH FOLEY 2WAY SLVR 5CC 24FR (CATHETERS) ×2 IMPLANT
CATH INTERMIT  6FR 70CM (CATHETERS) IMPLANT
CATH URET 5FR 28IN CONE TIP (BALLOONS)
CATH URET 5FR 70CM CONE TIP (BALLOONS) IMPLANT
CLOTH BEACON ORANGE TIMEOUT ST (SAFETY) ×3 IMPLANT
DRAPE CAMERA CLOSED 9X96 (DRAPES) ×3 IMPLANT
ELECT BUTTON BIOP 24F 90D PLAS (MISCELLANEOUS) IMPLANT
ELECT LOOP HF 26F 30D .35MM (CUTTING LOOP) IMPLANT
ELECT LOOP MED HF 24F 12D CBL (CLIP) ×1 IMPLANT
ELECT NEEDLE 45D HF 24-28F 12D (CUTTING LOOP) IMPLANT
ELECT REM PT RETURN 9FT ADLT (ELECTROSURGICAL)
ELECTRODE REM PT RTRN 9FT ADLT (ELECTROSURGICAL) ×2 IMPLANT
EVACUATOR MICROVAS BLADDER (UROLOGICAL SUPPLIES) ×1 IMPLANT
FIBER LASER FLEXIVA 1000 (UROLOGICAL SUPPLIES) ×1 IMPLANT
FIBER LASER FLEXIVA 200 (UROLOGICAL SUPPLIES) IMPLANT
FIBER LASER FLEXIVA 365 (UROLOGICAL SUPPLIES) IMPLANT
FIBER LASER FLEXIVA 550 (UROLOGICAL SUPPLIES) IMPLANT
GLOVE BIO SURGEON STRL SZ8 (GLOVE) ×3 IMPLANT
GLOVE BIOGEL PI IND STRL 7.5 (GLOVE) IMPLANT
GLOVE BIOGEL PI INDICATOR 7.5 (GLOVE) ×3
GOWN PREVENTION PLUS LG XLONG (DISPOSABLE) ×3 IMPLANT
GOWN STRL REIN XL XLG (GOWN DISPOSABLE) ×4 IMPLANT
GOWN XL W/COTTON TOWEL STD (GOWNS) ×2 IMPLANT
GUIDEWIRE 0.038 PTFE COATED (WIRE) ×3 IMPLANT
GUIDEWIRE ANG ZIPWIRE 038X150 (WIRE) IMPLANT
GUIDEWIRE STR DUAL SENSOR (WIRE) ×3 IMPLANT
HOLDER FOLEY CATH W/STRAP (MISCELLANEOUS) IMPLANT
IV NS IRRIG 3000ML ARTHROMATIC (IV SOLUTION) ×7 IMPLANT
KIT ASPIRATION TUBING (SET/KITS/TRAYS/PACK) ×1 IMPLANT
KIT BALLIN UROMAX 15FX10 (LABEL) IMPLANT
KIT BALLN UROMAX 15FX4 (MISCELLANEOUS) IMPLANT
KIT BALLN UROMAX 26 75X4 (MISCELLANEOUS)
LOOP CUTTING 24FR OLYMPUS (CUTTING LOOP) IMPLANT
LOOP ELECTRODE 28FR (MISCELLANEOUS) IMPLANT
NS IRRIG 500ML POUR BTL (IV SOLUTION) ×1 IMPLANT
PACK CYSTOSCOPY (CUSTOM PROCEDURE TRAY) ×3 IMPLANT
PLUG CATH AND CAP STER (CATHETERS) IMPLANT
SET HIGH PRES BAL DIL (LABEL)
SHEATH URET ACCESS 12FR/35CM (UROLOGICAL SUPPLIES) IMPLANT
SHEATH URET ACCESS 12FR/55CM (UROLOGICAL SUPPLIES) IMPLANT
WATER STERILE IRR 3000ML UROMA (IV SOLUTION) IMPLANT

## 2013-10-03 NOTE — Interval H&P Note (Signed)
History and Physical Interval Note:  10/03/2013 7:18 AM  [name]  has presented today for surgery, with the diagnosis of BLADDER STONE, BPH  The various methods of treatment have been discussed with the patient and family. After consideration of risks, benefits and other options for treatment, the patient has consented to  Procedure(s): TRANSURETHRAL INCISION OF THE PROSTATE (TUIP) (N/A) CYSTOSCOPY WITH LITHOLAPAXY (N/A) HOLMIUM LASER APPLICATION (N/A) as a surgical intervention .  The patient's history has been reviewed, patient examined, no change in status, stable for surgery.  I have reviewed the patient's chart and labs.  Questions were answered to the patient's satisfaction.     Garnett Farm

## 2013-10-03 NOTE — Anesthesia Preprocedure Evaluation (Signed)
Anesthesia Evaluation  Patient identified by MRN, date of birth, ID band Patient awake    Reviewed: Allergy & Precautions, H&P , NPO status , Patient's Chart, lab work & pertinent test results  Airway Mallampati: II TM Distance: >3 FB Neck ROM: Limited    Dental no notable dental hx.    Pulmonary neg pulmonary ROS,  breath sounds clear to auscultation  Pulmonary exam normal       Cardiovascular hypertension, Pt. on medications Rhythm:Regular Rate:Normal     Neuro/Psych Anxiety Bipolar Disorder CVA, No Residual Symptoms    GI/Hepatic negative GI ROS, Neg liver ROS,   Endo/Other  negative endocrine ROS  Renal/GU negative Renal ROS  negative genitourinary   Musculoskeletal negative musculoskeletal ROS (+)   Abdominal   Peds negative pediatric ROS (+)  Hematology negative hematology ROS (+)   Anesthesia Other Findings   Reproductive/Obstetrics negative OB ROS                           Anesthesia Physical Anesthesia Plan  ASA: III  Anesthesia Plan: General   Post-op Pain Management:    Induction: Intravenous  Airway Management Planned: LMA  Additional Equipment:   Intra-op Plan:   Post-operative Plan:   Informed Consent: I have reviewed the patients History and Physical, chart, labs and discussed the procedure including the risks, benefits and alternatives for the proposed anesthesia with the patient or authorized representative who has indicated his/her understanding and acceptance.   Dental advisory given  Plan Discussed with: CRNA and Surgeon  Anesthesia Plan Comments:         Anesthesia Quick Evaluation

## 2013-10-03 NOTE — Op Note (Signed)
PATIENT:  [name]  PRE-OPERATIVE DIAGNOSIS: 1. Bladder calculi (3 cm) 2. BPH with outlet obstruction  POST-OPERATIVE DIAGNOSIS: Same  PROCEDURE: 1. Cystolitholapaxy 2. Transurethral incision of the prostate  SURGEON:  Garnett Farm  INDICATION: [name] is a 67 year old male who was found to have bladder calculi on imaging studies that were confirmed cystoscopically. In addition he has a history of outlet obstruction and was found to have an obstructing prostate with a high median bar.  ANESTHESIA:  General  EBL:  Minimal  DRAINS: None  LOCAL MEDICATIONS USED:  None  SPECIMEN:  Stone given to patient  Description of procedure: After informed consent the patient was brought to the major OR, placed on the table and administered general anesthesia. He was then moved to the dorsal lithotomy position and his genitalia were sterilely prepped and draped. An official timeout was performed. He received Cipro 400 mg IV preoperatively.  The 22 French cystoscope with 12 lens was then passed under direct vision down the urethra which was noted be normal. The prostatic urethra revealed bilobar hypertrophy with obstruction and elongation and as well as a fairly high bladder neck/median bar. I then entered the bladder and noted 3+ trabeculation. Ureteral orifices were identified and noted to be of normal position and configuration. They were well away from the bladder neck. 2 bladder stones were seen on the floor of the bladder.  A 1000  holmium laser fiber was then used to fragment the stones. I then used the Microvasive evacuator to remove all stone fragments and reinspection of the bladder after cystolitholapaxy revealed no further stone fragments within the bladder and intact mucosa with no bleeding.  I removed the cystoscope and inserted the 26 French resectoscope sheath with visual obturator. The obturator was removed and the 12 lens with the resection element was inserted. I  used the gyrus ball to incise in the midline at the 6:00 position through the bladder neck and down to capsular fibers. I then deepened this back to the level of the veru. This developed a nice trough however I did remove further tissue on the right and left sides of this trough in order to give a good, open channel. I fulgurated all bleeding points and then reinspected the bladder. Ureteral orifices were intact. There was no bleeding. I therefore drained the bladder and removed the resectoscope. The patient was awakened and taken recovery room in stable and satisfactory condition. He tolerated the procedure well with no intraoperative complications.  PLAN OF CARE: Discharge to home after PACU  PATIENT DISPOSITION:  PACU - hemodynamically stable.

## 2013-10-03 NOTE — Transfer of Care (Signed)
Immediate Anesthesia Transfer of Care Note  Patient: [name]  Procedure(s) Performed: Procedure(s): TRANSURETHRAL INCISION OF THE PROSTATE (TUIP) (N/A) CYSTOSCOPY WITH LITHOLAPAXY (N/A) HOLMIUM LASER APPLICATION (N/A)  Patient Location: PACU  Anesthesia Type:General  Level of Consciousness: awake, sedated and patient cooperative  Airway & Oxygen Therapy: Patient Spontanous Breathing and Patient connected to nasal cannula oxygen  Post-op Assessment: Report given to PACU RN and Post -op Vital signs reviewed and stable  Post vital signs: Reviewed and stable  Complications: No apparent anesthesia complications

## 2013-10-03 NOTE — Anesthesia Postprocedure Evaluation (Signed)
  Anesthesia Post-op Note  Patient: [name]  Procedure(s) Performed: Procedure(s) (LRB): TRANSURETHRAL INCISION OF THE PROSTATE (TUIP) (N/A) CYSTOSCOPY WITH LITHOLAPAXY (N/A) HOLMIUM LASER APPLICATION (N/A)  Patient Location: PACU  Anesthesia Type: General  Level of Consciousness: awake and alert   Airway and Oxygen Therapy: Patient Spontanous Breathing  Post-op Pain: mild  Post-op Assessment: Post-op Vital signs reviewed, Patient's Cardiovascular Status Stable, Respiratory Function Stable, Patent Airway and No signs of Nausea or vomiting  Last Vitals:  Filed Vitals:   10/03/13 0830  BP: 189/95  Pulse: 81  Temp: 37.2 C  Resp: 14    Post-op Vital Signs: stable   Complications: No apparent anesthesia complications

## 2013-10-06 ENCOUNTER — Encounter (HOSPITAL_BASED_OUTPATIENT_CLINIC_OR_DEPARTMENT_OTHER): Payer: Self-pay | Admitting: Urology

## 2013-11-01 ENCOUNTER — Encounter (HOSPITAL_COMMUNITY): Payer: Self-pay | Admitting: Emergency Medicine

## 2013-11-01 ENCOUNTER — Emergency Department (HOSPITAL_COMMUNITY): Payer: Medicare Other

## 2013-11-01 ENCOUNTER — Emergency Department (HOSPITAL_COMMUNITY)
Admission: EM | Admit: 2013-11-01 | Discharge: 2013-11-01 | Disposition: A | Discharge status: Home / Self Care | Payer: Medicare Other | Attending: Emergency Medicine | Admitting: Emergency Medicine

## 2013-11-01 DIAGNOSIS — Z87448 Personal history of other diseases of urinary system: Secondary | ICD-10-CM | POA: Insufficient documentation

## 2013-11-01 DIAGNOSIS — F209 Schizophrenia, unspecified: Secondary | ICD-10-CM | POA: Insufficient documentation

## 2013-11-01 DIAGNOSIS — Z8669 Personal history of other diseases of the nervous system and sense organs: Secondary | ICD-10-CM | POA: Insufficient documentation

## 2013-11-01 DIAGNOSIS — F411 Generalized anxiety disorder: Secondary | ICD-10-CM | POA: Insufficient documentation

## 2013-11-01 DIAGNOSIS — F319 Bipolar disorder, unspecified: Secondary | ICD-10-CM | POA: Insufficient documentation

## 2013-11-01 DIAGNOSIS — Z85828 Personal history of other malignant neoplasm of skin: Secondary | ICD-10-CM | POA: Insufficient documentation

## 2013-11-01 DIAGNOSIS — Z862 Personal history of diseases of the blood and blood-forming organs and certain disorders involving the immune mechanism: Secondary | ICD-10-CM | POA: Insufficient documentation

## 2013-11-01 DIAGNOSIS — F0390 Unspecified dementia without behavioral disturbance: Secondary | ICD-10-CM | POA: Insufficient documentation

## 2013-11-01 DIAGNOSIS — R110 Nausea: Secondary | ICD-10-CM | POA: Insufficient documentation

## 2013-11-01 DIAGNOSIS — Z79899 Other long term (current) drug therapy: Secondary | ICD-10-CM | POA: Insufficient documentation

## 2013-11-01 DIAGNOSIS — R634 Abnormal weight loss: Secondary | ICD-10-CM

## 2013-11-01 DIAGNOSIS — R531 Weakness: Secondary | ICD-10-CM

## 2013-11-01 DIAGNOSIS — Z8639 Personal history of other endocrine, nutritional and metabolic disease: Secondary | ICD-10-CM | POA: Insufficient documentation

## 2013-11-01 DIAGNOSIS — N390 Urinary tract infection, site not specified: Secondary | ICD-10-CM

## 2013-11-01 DIAGNOSIS — F17200 Nicotine dependence, unspecified, uncomplicated: Secondary | ICD-10-CM | POA: Insufficient documentation

## 2013-11-01 DIAGNOSIS — I10 Essential (primary) hypertension: Secondary | ICD-10-CM | POA: Insufficient documentation

## 2013-11-01 DIAGNOSIS — Z8673 Personal history of transient ischemic attack (TIA), and cerebral infarction without residual deficits: Secondary | ICD-10-CM | POA: Insufficient documentation

## 2013-11-01 LAB — COMPREHENSIVE METABOLIC PANEL
ALT: 12 U/L (ref 0–53)
AST: 16 U/L (ref 0–37)
CO2: 27 mEq/L (ref 19–32)
Calcium: 9.3 mg/dL (ref 8.4–10.5)
Creatinine, Ser: 0.75 mg/dL (ref 0.50–1.35)
GFR calc non Af Amer: >90 mL/min (ref >90)
Potassium: 3.7 mEq/L (ref 3.5–5.1)
Total Protein: 6.7 g/dL (ref 6.0–8.3)

## 2013-11-01 LAB — URINALYSIS, ROUTINE W REFLEX MICROSCOPIC
Glucose, UA: NEGATIVE mg/dL
Ketones, ur: NEGATIVE mg/dL
Nitrite: NEGATIVE
Protein, ur: NEGATIVE mg/dL
Specific Gravity, Urine: 1.024 (ref 1.005–1.030)
Urobilinogen, UA: 1 mg/dL (ref 0.0–1.0)

## 2013-11-01 LAB — CBC WITH DIFFERENTIAL/PLATELET
Basophils Absolute: 0 10*3/uL (ref 0.0–0.1)
Basophils Relative: 0 % (ref 0–1)
Eosinophils Absolute: 0.1 10*3/uL (ref 0.0–0.7)
Eosinophils Relative: 1 % (ref 0–5)
HCT: 45.3 % (ref 39.0–52.0)
Lymphocytes Relative: 22 % (ref 12–46)
Lymphs Abs: 1.5 10*3/uL (ref 0.7–4.0)
MCH: 32.3 pg (ref 26.0–34.0)
MCHC: 35.5 g/dL (ref 30.0–36.0)
MCV: 91 fL (ref 78.0–100.0)
Monocytes Absolute: 0.5 10*3/uL (ref 0.1–1.0)
Neutrophils Relative %: 70 % (ref 43–77)
Platelets: 218 10*3/uL (ref 150–400)
RBC: 4.98 MIL/uL (ref 4.22–5.81)
RDW: 12.9 % (ref 11.5–15.5)

## 2013-11-01 LAB — URINE MICROSCOPIC-ADD ON

## 2013-11-01 MED ORDER — IOHEXOL 300 MG/ML  SOLN
50.0000 mL | Freq: Once | INTRAMUSCULAR | Status: AC | PRN
  Administered 2013-11-01: 50 mL via ORAL

## 2013-11-01 MED ORDER — PROMETHAZINE HCL 25 MG PO TABS: 25.0000 mg | ORAL_TABLET | Freq: Four times a day (QID) | ORAL | Status: DC | PRN

## 2013-11-01 MED ORDER — SODIUM CHLORIDE 0.9 % IV SOLN
1000.0000 mL | INTRAVENOUS | Status: DC
  Administered 2013-11-01: 1000 mL via INTRAVENOUS

## 2013-11-01 MED ORDER — SODIUM CHLORIDE 0.9 % IV SOLN
1000.0000 mL | Freq: Once | INTRAVENOUS | Status: AC
  Administered 2013-11-01: 1000 mL via INTRAVENOUS

## 2013-11-01 MED ORDER — ONDANSETRON HCL 4 MG/2ML IJ SOLN
4.0000 mg | Freq: Once | INTRAMUSCULAR | Status: AC
  Administered 2013-11-01: 4 mg via INTRAVENOUS
  Filled 2013-11-01: qty 2

## 2013-11-01 MED ORDER — CEPHALEXIN 500 MG PO CAPS: 500.0000 mg | ORAL_CAPSULE | Freq: Four times a day (QID) | ORAL | Status: DC

## 2013-11-01 MED ORDER — CEPHALEXIN 500 MG PO CAPS
500.0000 mg | ORAL_CAPSULE | Freq: Once | ORAL | Status: AC
  Administered 2013-11-01: 500 mg via ORAL
  Filled 2013-11-01: qty 1

## 2013-11-01 MED ORDER — IOHEXOL 300 MG/ML  SOLN
100.0000 mL | Freq: Once | INTRAMUSCULAR | Status: AC | PRN
  Administered 2013-11-01: 100 mL via INTRAVENOUS

## 2013-11-01 MED ORDER — ZOLPIDEM TARTRATE 5 MG PO TABS: 5.0000 mg | ORAL_TABLET | Freq: Every evening | ORAL | Status: DC | PRN

## 2013-11-01 NOTE — ED Provider Notes (Signed)
CSN: [id number]     Arrival date & time 11/01/13  0503 History   First MD Initiated Contact with Patient 11/01/13 0531     Chief Complaint  Patient presents with  . Nausea  . Weakness    HPI Patient reports nausea and ongoing anorexia over the past 2 years.  She feels like his generalized weakness is worsened over the past 24 hours as has his nausea.  Patient has had an extensive workup including EGD and colonoscopy as well as CT scan in September which demonstrated no abnormalities.  The patient continues to smoke cigarettes.  He continues to lose weight.  He denies unilateral weakness.  He is having some difficulty sleeping.  This has also been working with his primary care physician on the cause of his symptoms for some time now.  No fevers or chills.  No hematemesis.  No melena or hematochezia.  No urinary complaints.  No vomiting or diarrhea just nausea and anorexia   Past Medical History  Diagnosis Date  . Hyperlipidemia   . Vitamin B12 deficiency   . Occasional tremors     leg tremors  . BPH (benign prostatic hypertrophy) with urinary obstruction   . History of CVA (cerebrovascular accident) without residual deficits     2011  . Other degenerative diseases of the basal ganglia     S/P CVA 2011--  CHRONIC LEFT BASAL GANGLIA LACUNAR INFARCT  PER CT  . History of nonmelanoma skin cancer     EXCISION OF NOSE  . Hypertension   . Bladder stone   . Schizophrenia     HX PSYCHIATRIC ADMISSION'S  AND ECT (SHOCK) TX  . Bipolar affective disorder   . ED (erectile dysfunction) of organic origin   . Dementia   . Depression   . Constipation   . Generalized weakness     LOWER EXTREMITIES  . Chronic anxiety    Past Surgical History  Procedure Laterality Date  . Transanal excision anal and rectal polyps  11-02-2000  . Transthoracic echocardiogram  02-03-2012    GRADE I DIASTOLIC DYSFUNCTION/  EF 60-65%  . Cardiovascular stress test  06-18-2013  DR Charlton Haws    LOW RISK NO  EXERCISE LEXISCAN STUDY/  NO ISCHEMIA, CANNOT RULE OUT BASAL INFERIOR INFARCTION BUT POSSIBLE ARTIFACT GIVEN NORMAL WALL MOTION/  EF 73%  . Excision tumor upper left thigh  2012  . Transurethral incision of prostate N/A 10/03/2013    Procedure: TRANSURETHRAL INCISION OF THE PROSTATE (TUIP);  Surgeon: Garnett Farm, MD;  Location[facility];  Service: Urology;  Laterality: N/A;  . Cystoscopy with litholapaxy N/A 10/03/2013    Procedure: CYSTOSCOPY WITH LITHOLAPAXY;  Surgeon: Garnett Farm, MD;  Location: [facility];  Service: Urology;  Laterality: N/A;  . Holmium laser application N/A 10/03/2013    Procedure: HOLMIUM LASER APPLICATION;  Surgeon: Garnett Farm, MD;  Location: [facility];  Service: Urology;  Laterality: N/A;   Family History  Problem Relation Age of Onset  . Dementia Father   . Dementia Mother   . Colon cancer Neg Hx   . Colon polyps Neg Hx   . Rectal cancer Neg Hx   . Stomach cancer Neg Hx    History  Substance Use Topics  . Smoking status: Current Every Day Smoker -- 0.50 packs/day for 48 years    Types: Cigarettes  . Smokeless tobacco: Never Used  . Alcohol Use: No    Review of  Systems  All other systems reviewed and are negative.    Allergies  Review of patient's allergies indicates no known allergies.  Home Medications   Current Outpatient Rx  Name  Route  Sig  Dispense  Refill  . ALPRAZolam (XANAX) 0.5 MG tablet   Oral   Take 0.5 mg by mouth 3 (three) times daily as needed for anxiety.         Marland Kitchen amLODipine (NORVASC) 5 MG tablet   Oral   Take 5 mg by mouth daily.          Marland Kitchen donepezil (ARICEPT) 10 MG tablet   Oral   Take 10 mg by mouth every morning.         . promethazine (PHENERGAN) 25 MG tablet   Oral   Take 25 mg by mouth every 6 (six) hours as needed for nausea.         . cephALEXin (KEFLEX) 500 MG capsule   Oral   Take 1 capsule (500 mg total) by mouth 4 (four) times daily.    20 capsule   0   . promethazine (PHENERGAN) 25 MG tablet   Oral   Take 1 tablet (25 mg total) by mouth every 6 (six) hours as needed for nausea or vomiting.   12 tablet   0   . zolpidem (AMBIEN) 5 MG tablet   Oral   Take 1 tablet (5 mg total) by mouth at bedtime as needed for sleep.   10 tablet   0    BP 153/83  Pulse 78  Temp(Src) 98.3 F (36.8 C) (Oral)  Resp 18  Wt 197 lb (89.359 kg)  SpO2 96% Physical Exam  Nursing note and vitals reviewed. Constitutional: He is oriented to person, place, and time. He appears well-developed and well-nourished.  HENT:  Head: Normocephalic and atraumatic.  Eyes: EOM are normal.  Neck: Normal range of motion.  Cardiovascular: Normal rate, regular rhythm, normal heart sounds and intact distal pulses.   Pulmonary/Chest: Effort normal and breath sounds normal. No respiratory distress.  Abdominal: Soft. He exhibits no distension. There is no tenderness.  Musculoskeletal: Normal range of motion.  Neurological: He is alert and oriented to person, place, and time.  Skin: Skin is warm and dry.  Psychiatric: He has a normal mood and affect. Judgment normal.    ED Course  Procedures (including critical care time) Labs Review Labs Reviewed  COMPREHENSIVE METABOLIC PANEL - Abnormal; Notable for the following:    Glucose, Bld 100 (*)    All other components within normal limits  URINALYSIS, ROUTINE W REFLEX MICROSCOPIC - Abnormal; Notable for the following:    APPearance CLOUDY (*)    Hgb urine dipstick MODERATE (*)    Leukocytes, UA LARGE (*)    All other components within normal limits  URINE CULTURE  CBC WITH DIFFERENTIAL  LIPASE, BLOOD  URINE MICROSCOPIC-ADD ON  CG4 I-STAT (LACTIC ACID)   Imaging Review Dg Chest 2 View  11/01/2013   CLINICAL DATA:  Generalized weakness, weight loss, nausea.  EXAM: CHEST  2 VIEW  COMPARISON:  06/10/2013  FINDINGS: Hyperinflation suggesting emphysematous change. Tortuous aorta. The heart size and  mediastinal contours are within normal limits. Both lungs are clear. The visualized skeletal structures are unremarkable.  IMPRESSION: No active cardiopulmonary disease.   Electronically Signed   By: Burman Nieves M.D.   On: 11/01/2013 06:03   Ct Abdomen Pelvis W Contrast  11/01/2013   CLINICAL DATA:  Nausea, weight loss  EXAM: CT ABDOMEN AND PELVIS WITH CONTRAST  TECHNIQUE: Multidetector CT imaging of the abdomen and pelvis was performed using the standard protocol following bolus administration of intravenous contrast.  CONTRAST:  50mL OMNIPAQUE IOHEXOL 300 MG/ML SOLN, OMNIPAQUE IOHEXOL 300 MG/ML SOLN  COMPARISON:  08/25/2013  FINDINGS: Visualized lung bases clear. Gallbladder nondistended. Unremarkable liver, spleen, kidneys, pancreas. Mild bilateral adrenal hyperplasia. Minimal plaque in the aorta without aneurysm or stenosis. Portal vein patent. Stomach, small bowel, and colon are nondilated. Normal appendix. Urinary bladder is nondistended; calculus seen on prior study is no longer present. Moderate prostatic enlargement with central coarse calcifications. Bilateral pelvic phleboliths. No ascites. No free air. No adenopathy localized. Minimal spondylitic changes in the lumbar spine.  IMPRESSION: 1. No residual bladder calculus. 2. No acute process.   Electronically Signed   By: Oley Balm M.D.   On: 11/01/2013 07:41  I personally reviewed the imaging tests through PACS system I reviewed available ER/hospitalization records through the EMR   EKG Interpretation   None       MDM   1. Weakness   2. Urinary tract infection   3. Weight loss     Patient feels much better at this time.  Discharge home with PCP followup.  Will treat his urinary tract infection.  Home with something for sleep as well as nausea.  He understands to return to ER for new or worsening symptoms   Lyanne Co, MD 11/01/13 [phone]

## 2013-11-01 NOTE — ED Notes (Signed)
Wife states pt is nauseated all the time and cannot eat, has no appetite, has general weakness, weight loss, and difficulty sleeping  PCP is Dr Jonny Ruiz

## 2013-11-01 NOTE — ED Notes (Signed)
Patient transported to X-ray 

## 2013-11-01 NOTE — ED Notes (Signed)
Patient transported to CT 

## 2013-11-02 LAB — URINE CULTURE: Culture: NO GROWTH

## 2013-11-10 ENCOUNTER — Other Ambulatory Visit: Payer: Self-pay | Admitting: Internal Medicine

## 2013-11-10 MED ORDER — ALPRAZOLAM 0.5 MG PO TABS: ORAL_TABLET | ORAL | Status: DC

## 2013-11-10 NOTE — Telephone Encounter (Signed)
Done hardcopy to [facility]  

## 2013-11-10 NOTE — Telephone Encounter (Signed)
Ok to refill? Last OV 7.8.2014

## 2013-11-11 NOTE — Telephone Encounter (Signed)
Faxed hardcopy to [facility] [place] 

## 2013-12-02 ENCOUNTER — Encounter: Payer: Self-pay | Admitting: Internal Medicine

## 2013-12-02 ENCOUNTER — Ambulatory Visit (INDEPENDENT_AMBULATORY_CARE_PROVIDER_SITE_OTHER): Payer: Medicare Other | Admitting: Internal Medicine

## 2013-12-02 ENCOUNTER — Other Ambulatory Visit (INDEPENDENT_AMBULATORY_CARE_PROVIDER_SITE_OTHER): Payer: Medicare Other

## 2013-12-02 VITALS — BP 128/80 | HR 76 | Temp 99.3°F | Wt 200.8 lb

## 2013-12-02 DIAGNOSIS — F329 Major depressive disorder, single episode, unspecified: Secondary | ICD-10-CM

## 2013-12-02 DIAGNOSIS — J069 Acute upper respiratory infection, unspecified: Secondary | ICD-10-CM | POA: Insufficient documentation

## 2013-12-02 DIAGNOSIS — R350 Frequency of micturition: Secondary | ICD-10-CM

## 2013-12-02 DIAGNOSIS — R110 Nausea: Secondary | ICD-10-CM

## 2013-12-02 DIAGNOSIS — F3289 Other specified depressive episodes: Secondary | ICD-10-CM

## 2013-12-02 DIAGNOSIS — R32 Unspecified urinary incontinence: Secondary | ICD-10-CM | POA: Insufficient documentation

## 2013-12-02 LAB — URINALYSIS, ROUTINE W REFLEX MICROSCOPIC
Nitrite: NEGATIVE
Specific Gravity, Urine: >=1.03 — AB (ref 1.000–1.030)
Total Protein, Urine: 30 — AB
Urine Glucose: NEGATIVE

## 2013-12-02 MED ORDER — CITALOPRAM HYDROBROMIDE 10 MG PO TABS: 10.0000 mg | ORAL_TABLET | Freq: Every day | ORAL | Status: DC

## 2013-12-02 MED ORDER — CEPHALEXIN 500 MG PO CAPS: 500.0000 mg | ORAL_CAPSULE | Freq: Four times a day (QID) | ORAL | Status: DC

## 2013-12-02 NOTE — Progress Notes (Signed)
Pre-visit discussion using our clinic review tool. No additional management support is needed unless otherwise documented below in the visit note.  

## 2013-12-02 NOTE — Assessment & Plan Note (Signed)
Incidental, likely viral, mucinex otc prn, to f/u any worsening symptoms or concerns

## 2013-12-02 NOTE — Assessment & Plan Note (Signed)
Persistent, for gastric emptying scan, r/o gastroparesis

## 2013-12-02 NOTE — Progress Notes (Signed)
Subjective:    Patient ID: [name], male    DOB: May 10, 1946, 67 y.o.   MRN: [id number]  HPI  Here to f/u, with wife., difficult historian due to psychiatric, incidentally with mild URI symptoms x 2 days, but has had persistent ongoing near daily nausea for several months, wihtout fever, vomiting, and no help with PPI.  Has had signficant eval, suggested for gastric empyting scan per GI but he declined at the time, more amenable now.  Also with urinary freq and dysuria but Denies urinary symptoms such as urgency, flank pain, hematuria or n/v, fever.  Has had several lbs wt loss.  Also with mild worsening depressive symptoms, no suicidal ideation, or panic.  Does have f/u with urology next month Past Medical History  Diagnosis Date  . Hyperlipidemia   . Vitamin B12 deficiency   . Occasional tremors     leg tremors  . BPH (benign prostatic hypertrophy) with urinary obstruction   . History of CVA (cerebrovascular accident) without residual deficits     2011  . Other degenerative diseases of the basal ganglia     S/P CVA 2011--  CHRONIC LEFT BASAL GANGLIA LACUNAR INFARCT  PER CT  . History of nonmelanoma skin cancer     EXCISION OF NOSE  . Hypertension   . Bladder stone   . Schizophrenia     HX PSYCHIATRIC ADMISSION'S  AND ECT (SHOCK) TX  . Bipolar affective disorder   . ED (erectile dysfunction) of organic origin   . Dementia   . Depression   . Constipation   . Generalized weakness     LOWER EXTREMITIES  . Chronic anxiety    Past Surgical History  Procedure Laterality Date  . Transanal excision anal and rectal polyps  11-02-2000  . Transthoracic echocardiogram  02-03-2012    GRADE I DIASTOLIC DYSFUNCTION/  EF 60-65%  . Cardiovascular stress test  06-18-2013  DR Charlton Haws    LOW RISK NO EXERCISE LEXISCAN STUDY/  NO ISCHEMIA, CANNOT RULE OUT BASAL INFERIOR INFARCTION BUT POSSIBLE ARTIFACT GIVEN NORMAL WALL MOTION/  EF 73%  . Excision tumor upper left thigh  2012  .  Transurethral incision of prostate N/A 10/03/2013    Procedure: TRANSURETHRAL INCISION OF THE PROSTATE (TUIP);  Surgeon: Garnett Farm, MD;  Location: [facility];  Service: Urology;  Laterality: N/A;  . Cystoscopy with litholapaxy N/A 10/03/2013    Procedure: CYSTOSCOPY WITH LITHOLAPAXY;  Surgeon: Garnett Farm, MD;  Location: [facility];  Service: Urology;  Laterality: N/A;  . Holmium laser application N/A 10/03/2013    Procedure: HOLMIUM LASER APPLICATION;  Surgeon: Garnett Farm, MD;  Location: [facility];  Service: Urology;  Laterality: N/A;    reports that he has been smoking Cigarettes.  He has a 24 pack-year smoking history. He has never used smokeless tobacco. He reports that he does not drink alcohol or use illicit drugs. family history includes Dementia in his father and mother. There is no history of Colon cancer, Colon polyps, Rectal cancer, or Stomach cancer. No Known Allergies Current Outpatient Prescriptions on File Prior to Visit  Medication Sig Dispense Refill  . ALPRAZolam (XANAX) 0.5 MG tablet TAKE 1 TABLET BY MOUTH twice per day as needed  60 tablet  1  . amLODipine (NORVASC) 5 MG tablet Take 5 mg by mouth daily.       Marland Kitchen donepezil (ARICEPT) 10 MG tablet Take 10 mg by mouth every morning.      Marland Kitchen  promethazine (PHENERGAN) 25 MG tablet Take 25 mg by mouth every 6 (six) hours as needed for nausea.      . promethazine (PHENERGAN) 25 MG tablet Take 1 tablet (25 mg total) by mouth every 6 (six) hours as needed for nausea or vomiting.  12 tablet  0  . zolpidem (AMBIEN) 5 MG tablet Take 1 tablet (5 mg total) by mouth at bedtime as needed for sleep.  10 tablet  0   No current facility-administered medications on file prior to visit.   Review of Systems  Constitutional: Negative for unexpected weight change, or unusual diaphoresis  HENT: Negative for tinnitus.   Eyes: Negative for photophobia and visual disturbance.  Respiratory:  Negative for choking and stridor.   Gastrointestinal: Negative for vomiting and blood in stool.  Genitourinary: Negative for hematuria and decreased urine volume.  Musculoskeletal: Negative for acute joint swelling Skin: Negative for color change and wound.  Neurological: Negative for tremors and numbness other than noted  Psychiatric/Behavioral: Negative for decreased concentration or  hyperactivity.       Objective:   Physical Exam BP 128/80  Pulse 76  Temp(Src) 99.3 F (37.4 C) (Oral)  Wt 200 lb 12 oz (91.06 kg)  SpO2 98% VS noted, mild ill Constitutional: Pt appears well-developed and well-nourished.  HENT: Head: NCAT.  Right Ear: External ear normal.  Left Ear: External ear normal.  Bilat tm's with mild erythema.  Max sinus areas non tender.  Pharynx with mild erythema, no exudate Eyes: Conjunctivae and EOM are normal. Pupils are equal, round, and reactive to light.  Neck: Normal range of motion. Neck supple.  Cardiovascular: Normal rate and regular rhythm.   Pulmonary/Chest: Effort normal and breath sounds normal.  Abd:  Soft, NT, non-distended, + BS, no flank tender Neurological: Pt is alert. Not confused  Skin: Skin is warm. No erythema.     Assessment & Plan:

## 2013-12-02 NOTE — Assessment & Plan Note (Signed)
Refuses all psychiatric referral, for citalopram 10 qd

## 2013-12-02 NOTE — Assessment & Plan Note (Signed)
For urine study, possible uti - for antibx, UA

## 2013-12-02 NOTE — Patient Instructions (Signed)
Please take all new medication as prescribed - the antibiotic, and anxiety medication Please continue all other medications as before Please have the pharmacy call with any other refills you may need.  You will be contacted regarding the referral for: gastric empyting scan  Please keep your appointments with your specialists as you have planned - urology

## 2013-12-16 ENCOUNTER — Telehealth: Payer: Self-pay | Admitting: *Deleted

## 2013-12-16 MED ORDER — BENZONATATE 100 MG PO CAPS: ORAL_CAPSULE | ORAL | Status: DC

## 2013-12-16 NOTE — Telephone Encounter (Signed)
Spouse phoned stating that patient's cough is becoming increasingly worse, preventing him from sleeping at night & she is requesting MD to prescribe something to help patient sleep.  Last OV with PCP 12/02/13.  Please advise.   CB# [phone]

## 2013-12-16 NOTE — Telephone Encounter (Signed)
If the cough is preventing sleep, then ok to try delsym OTC , and/or tess perle prn

## 2013-12-17 ENCOUNTER — Telehealth: Payer: Self-pay | Admitting: *Deleted

## 2013-12-17 MED ORDER — ZOLPIDEM TARTRATE 5 MG PO TABS: 5.0000 mg | ORAL_TABLET | Freq: Every evening | ORAL | Status: DC | PRN

## 2013-12-17 NOTE — Telephone Encounter (Signed)
Patient phoned stating he had been informed by his pharmacy that the newly prescribed Rx (only thing new on MAR is tessalon perle-which wife states he does NOT need) is on backorder.  States if she mis-communicated her need, patient needs something for sleep.  States that the (ordered already) Lorrin Mais is ineffective & is requesting replacement sedative.  Please advise.  Spouse states pt suffers "badly" from insomnia.   CB# [phone]

## 2013-12-17 NOTE — Telephone Encounter (Signed)
Patient informed. 

## 2013-12-17 NOTE — Telephone Encounter (Signed)
Did the ambien help with the last prescription at 5 mg?

## 2013-12-17 NOTE — Telephone Encounter (Signed)
Called the patient and Yes the Ambien 5 mg did help and ok to send in.

## 2013-12-17 NOTE — Telephone Encounter (Signed)
Done hardcopy to [facility]  

## 2013-12-18 NOTE — Telephone Encounter (Signed)
Patient informed and faxed hardcopy to [facility]

## 2013-12-22 ENCOUNTER — Encounter (HOSPITAL_COMMUNITY): Payer: Self-pay

## 2013-12-22 ENCOUNTER — Ambulatory Visit (HOSPITAL_COMMUNITY)
Admission: RE | Admit: 2013-12-22 | Discharge: 2013-12-22 | Disposition: A | Discharge status: Home / Self Care | Payer: Medicare Other | Source: Ambulatory Visit | Attending: Internal Medicine | Admitting: Internal Medicine

## 2013-12-22 ENCOUNTER — Encounter: Payer: Self-pay | Admitting: Internal Medicine

## 2013-12-22 DIAGNOSIS — R110 Nausea: Secondary | ICD-10-CM | POA: Insufficient documentation

## 2013-12-22 MED ORDER — TECHNETIUM TC 99M SULFUR COLLOID
2.0000 | Freq: Once | INTRAVENOUS | Status: AC | PRN
  Administered 2013-12-22: 2 via ORAL

## 2013-12-25 ENCOUNTER — Telehealth: Payer: Self-pay | Admitting: Internal Medicine

## 2013-12-25 NOTE — Telephone Encounter (Signed)
The patient would like the results from the Gastric Emptying study on 12/22/13.

## 2013-12-25 NOTE — Telephone Encounter (Signed)
Pt request test result that was done 12/22/13. Please call pt

## 2013-12-25 NOTE — Telephone Encounter (Signed)
normal

## 2013-12-26 NOTE — Telephone Encounter (Signed)
Patient's wife informed of results. 

## 2014-02-03 ENCOUNTER — Other Ambulatory Visit: Payer: Self-pay | Admitting: Internal Medicine

## 2014-02-09 ENCOUNTER — Other Ambulatory Visit: Payer: Self-pay | Admitting: Internal Medicine

## 2014-02-10 NOTE — Telephone Encounter (Signed)
Done hardcopy to [facility]  

## 2014-02-10 NOTE — Telephone Encounter (Signed)
Faxed hardcopy to [place]. [facility] [place]

## 2014-02-12 ENCOUNTER — Telehealth: Payer: Self-pay | Admitting: *Deleted

## 2014-02-12 ENCOUNTER — Encounter: Payer: Self-pay | Admitting: *Deleted

## 2014-02-12 ENCOUNTER — Other Ambulatory Visit: Payer: Self-pay | Admitting: *Deleted

## 2014-02-12 DIAGNOSIS — E538 Deficiency of other specified B group vitamins: Secondary | ICD-10-CM

## 2014-02-12 NOTE — Telephone Encounter (Signed)
Left a message for patient to call me. 

## 2014-02-12 NOTE — Telephone Encounter (Signed)
error 

## 2014-02-12 NOTE — Telephone Encounter (Signed)
Spoke with patient's wife and she will have patient come for lab.

## 2014-02-12 NOTE — Telephone Encounter (Signed)
Message copied by Hulan Saas on Thu Feb 12, 2014  9:29 AM ------      Message from: Hulan Saas      Created: Tue Aug 19, 2013  2:42 PM       Call and remind patient due for B12 level for DB on 02/16/13. Lab in EPIC ------

## 2014-02-26 ENCOUNTER — Encounter: Payer: Self-pay | Admitting: *Deleted

## 2014-02-26 ENCOUNTER — Telehealth: Payer: Self-pay | Admitting: *Deleted

## 2014-02-26 ENCOUNTER — Other Ambulatory Visit: Payer: Self-pay | Admitting: Internal Medicine

## 2014-02-26 NOTE — Telephone Encounter (Signed)
Message copied by Hulan Saas on Thu Feb 26, 2014 10:08 AM ------      Message from: Hulan Saas      Created: Thu Feb 12, 2014  9:49 AM       Did patient come for lab for DB ------

## 2014-02-26 NOTE — Telephone Encounter (Signed)
Letter mailed to patient reminding him to come for labs.

## 2014-04-13 ENCOUNTER — Other Ambulatory Visit: Payer: Self-pay | Admitting: Internal Medicine

## 2014-04-13 MED ORDER — ZOLPIDEM TARTRATE 5 MG PO TABS: 5.0000 mg | ORAL_TABLET | Freq: Every evening | ORAL | Status: DC | PRN

## 2014-04-13 NOTE — Telephone Encounter (Signed)
Done hardcopy to [facility]  

## 2014-04-13 NOTE — Telephone Encounter (Signed)
[name] also done hardcopy to [name]

## 2014-04-14 NOTE — Telephone Encounter (Signed)
Faxed hardcopy to [facility]

## 2014-04-14 NOTE — Telephone Encounter (Signed)
Faxed hardcopy to [facility] [place] 

## 2014-04-22 ENCOUNTER — Telehealth: Payer: Self-pay | Admitting: *Deleted

## 2014-04-22 NOTE — Telephone Encounter (Signed)
Received fax pt needing PA on zolpidem. Completed PA on cover-my-meds. Received denial coverage fax stating insurance will only allow #90 for a year supply.Information has been fax back to pharmacy with denial coverage.Marland KitchenJohny Chess

## 2014-04-22 NOTE — Telephone Encounter (Signed)
noted 

## 2014-06-15 ENCOUNTER — Other Ambulatory Visit: Payer: Self-pay | Admitting: Internal Medicine

## 2014-06-16 NOTE — Telephone Encounter (Signed)
Faxed hardcopy to [facility] [place] 

## 2014-06-16 NOTE — Telephone Encounter (Signed)
Done hardcopy to [facility]  

## 2014-07-14 ENCOUNTER — Telehealth: Payer: Self-pay | Admitting: Internal Medicine

## 2014-07-14 MED ORDER — ONDANSETRON HCL 4 MG PO TABS: 4.0000 mg | ORAL_TABLET | Freq: Three times a day (TID) | ORAL | Status: DC | PRN

## 2014-07-14 NOTE — Telephone Encounter (Signed)
Pt request Dr. Jenny Reichmann to call in something for [name] due to nausea,sick in stomach, pt is coming in on 07/17/14. Please advise.

## 2014-07-14 NOTE — Telephone Encounter (Signed)
Done erx 

## 2014-07-14 NOTE — Telephone Encounter (Signed)
Patient informed. 

## 2014-07-17 ENCOUNTER — Ambulatory Visit: Payer: Medicare Other | Admitting: Internal Medicine

## 2014-07-24 ENCOUNTER — Ambulatory Visit: Payer: Medicare Other | Admitting: Internal Medicine

## 2014-08-12 ENCOUNTER — Encounter: Payer: Self-pay | Admitting: Internal Medicine

## 2014-08-12 ENCOUNTER — Other Ambulatory Visit: Payer: Self-pay

## 2014-08-12 ENCOUNTER — Other Ambulatory Visit (INDEPENDENT_AMBULATORY_CARE_PROVIDER_SITE_OTHER): Payer: Medicare Other

## 2014-08-12 ENCOUNTER — Telehealth: Payer: Self-pay | Admitting: Internal Medicine

## 2014-08-12 ENCOUNTER — Ambulatory Visit (INDEPENDENT_AMBULATORY_CARE_PROVIDER_SITE_OTHER): Payer: Medicare Other | Admitting: Internal Medicine

## 2014-08-12 ENCOUNTER — Other Ambulatory Visit: Payer: Self-pay | Admitting: *Deleted

## 2014-08-12 VITALS — BP 132/92 | HR 87 | Temp 98.3°F | Wt 200.0 lb

## 2014-08-12 DIAGNOSIS — E538 Deficiency of other specified B group vitamins: Secondary | ICD-10-CM

## 2014-08-12 DIAGNOSIS — R42 Dizziness and giddiness: Secondary | ICD-10-CM

## 2014-08-12 DIAGNOSIS — N400 Enlarged prostate without lower urinary tract symptoms: Secondary | ICD-10-CM

## 2014-08-12 DIAGNOSIS — N210 Calculus in bladder: Secondary | ICD-10-CM | POA: Insufficient documentation

## 2014-08-12 DIAGNOSIS — I10 Essential (primary) hypertension: Secondary | ICD-10-CM

## 2014-08-12 DIAGNOSIS — R350 Frequency of micturition: Secondary | ICD-10-CM

## 2014-08-12 DIAGNOSIS — R7302 Impaired glucose tolerance (oral): Secondary | ICD-10-CM

## 2014-08-12 DIAGNOSIS — E785 Hyperlipidemia, unspecified: Secondary | ICD-10-CM

## 2014-08-12 DIAGNOSIS — R269 Unspecified abnormalities of gait and mobility: Secondary | ICD-10-CM

## 2014-08-12 DIAGNOSIS — R972 Elevated prostate specific antigen [PSA]: Secondary | ICD-10-CM

## 2014-08-12 DIAGNOSIS — R7309 Other abnormal glucose: Secondary | ICD-10-CM

## 2014-08-12 DIAGNOSIS — I951 Orthostatic hypotension: Secondary | ICD-10-CM

## 2014-08-12 DIAGNOSIS — F3160 Bipolar disorder, current episode mixed, unspecified: Secondary | ICD-10-CM

## 2014-08-12 DIAGNOSIS — F0390 Unspecified dementia without behavioral disturbance: Secondary | ICD-10-CM

## 2014-08-12 DIAGNOSIS — Z79899 Other long term (current) drug therapy: Secondary | ICD-10-CM

## 2014-08-12 DIAGNOSIS — F319 Bipolar disorder, unspecified: Secondary | ICD-10-CM

## 2014-08-12 LAB — HEPATIC FUNCTION PANEL
ALT: 21 U/L (ref 0–53)
AST: 19 U/L (ref 0–37)
Albumin: 4.3 g/dL (ref 3.5–5.2)
Alkaline Phosphatase: 96 U/L (ref 39–117)
BILIRUBIN TOTAL: 0.7 mg/dL (ref 0.2–1.2)
Bilirubin, Direct: 0.1 mg/dL (ref 0.0–0.3)
Total Protein: 7.2 g/dL (ref 6.0–8.3)

## 2014-08-12 LAB — URINALYSIS, ROUTINE W REFLEX MICROSCOPIC
Bilirubin Urine: NEGATIVE
HGB URINE DIPSTICK: NEGATIVE
Ketones, ur: NEGATIVE
NITRITE: POSITIVE — AB
Specific Gravity, Urine: 1.02 (ref 1.000–1.030)
TOTAL PROTEIN, URINE-UPE24: NEGATIVE
Urine Glucose: NEGATIVE
Urobilinogen, UA: 2 — AB (ref 0.0–1.0)
pH: 6.5 (ref 5.0–8.0)

## 2014-08-12 LAB — VITAMIN B12: Vitamin B-12: 277 pg/mL (ref 211–911)

## 2014-08-12 LAB — CBC WITH DIFFERENTIAL/PLATELET
BASOS ABS: 0.1 10*3/uL (ref 0.0–0.1)
Basophils Relative: 0.8 % (ref 0.0–3.0)
EOS ABS: 0.1 10*3/uL (ref 0.0–0.7)
Eosinophils Relative: 0.6 % (ref 0.0–5.0)
HEMATOCRIT: 48 % (ref 39.0–52.0)
HEMOGLOBIN: 16.4 g/dL (ref 13.0–17.0)
LYMPHS ABS: 1.6 10*3/uL (ref 0.7–4.0)
Lymphocytes Relative: 17.2 % (ref 12.0–46.0)
MCHC: 34.2 g/dL (ref 30.0–36.0)
MCV: 93.8 fl (ref 78.0–100.0)
MONO ABS: 0.5 10*3/uL (ref 0.1–1.0)
MONOS PCT: 5.9 % (ref 3.0–12.0)
NEUTROS ABS: 6.8 10*3/uL (ref 1.4–7.7)
Neutrophils Relative %: 75.5 % (ref 43.0–77.0)
Platelets: 238 10*3/uL (ref 150.0–400.0)
RBC: 5.11 Mil/uL (ref 4.22–5.81)
RDW: 13.3 % (ref 11.5–15.5)
WBC: 9.1 10*3/uL (ref 4.0–10.5)

## 2014-08-12 LAB — LIPID PANEL
Cholesterol: 256 mg/dL — ABNORMAL HIGH (ref 0–200)
HDL: 44.6 mg/dL (ref >39.00)
LDL CALC: 191 mg/dL — AB (ref 0–99)
NONHDL: 211.4
Total CHOL/HDL Ratio: 6
Triglycerides: 102 mg/dL (ref 0.0–149.0)
VLDL: 20.4 mg/dL (ref 0.0–40.0)

## 2014-08-12 LAB — TSH: TSH: 1.46 u[IU]/mL (ref 0.35–4.50)

## 2014-08-12 LAB — BASIC METABOLIC PANEL
BUN: 13 mg/dL (ref 6–23)
CALCIUM: 9.4 mg/dL (ref 8.4–10.5)
CO2: 28 mEq/L (ref 19–32)
Chloride: 106 mEq/L (ref 96–112)
Creatinine, Ser: 0.9 mg/dL (ref 0.4–1.5)
GFR: 90.05 mL/min (ref >60.00)
Glucose, Bld: 110 mg/dL — ABNORMAL HIGH (ref 70–99)
Potassium: 3.8 mEq/L (ref 3.5–5.1)
SODIUM: 142 meq/L (ref 135–145)

## 2014-08-12 LAB — SEDIMENTATION RATE: SED RATE: 8 mm/h (ref 0–22)

## 2014-08-12 LAB — HEMOGLOBIN A1C: Hgb A1c MFr Bld: 5.4 % (ref 4.6–6.5)

## 2014-08-12 LAB — PSA: PSA: 4.67 ng/mL — ABNORMAL HIGH (ref 0.10–4.00)

## 2014-08-12 MED ORDER — ONDANSETRON HCL 4 MG PO TABS: 4.0000 mg | ORAL_TABLET | Freq: Three times a day (TID) | ORAL | Status: DC | PRN

## 2014-08-12 MED ORDER — CEPHALEXIN 500 MG PO CAPS: 500.0000 mg | ORAL_CAPSULE | Freq: Four times a day (QID) | ORAL | Status: DC

## 2014-08-12 MED ORDER — ATORVASTATIN CALCIUM 10 MG PO TABS: 10.0000 mg | ORAL_TABLET | Freq: Every day | ORAL | Status: DC

## 2014-08-12 MED ORDER — OMEPRAZOLE 20 MG PO CPDR: 20.0000 mg | DELAYED_RELEASE_CAPSULE | Freq: Every day | ORAL | Status: DC

## 2014-08-12 NOTE — Assessment & Plan Note (Signed)
Declines urology f/U but for UA and cx

## 2014-08-12 NOTE — Assessment & Plan Note (Signed)
Mild in past, also for a1c with labs,  to f/u any worsening symptoms or concerns

## 2014-08-12 NOTE — Addendum Note (Signed)
Addended by: Sharon Seller B on: 08/12/2014 09:49 AM   Modules accepted: Orders

## 2014-08-12 NOTE — Assessment & Plan Note (Signed)
To cont same tx for now, as lower BP not hypotensive

## 2014-08-12 NOTE — Assessment & Plan Note (Signed)
Cont's to decline psych referral or other tx,  to f/u any worsening symptoms or concerns

## 2014-08-12 NOTE — Progress Notes (Signed)
Pre visit review using our clinic review tool, if applicable. No additional management support is needed unless otherwise documented below in the visit note. 

## 2014-08-12 NOTE — Assessment & Plan Note (Signed)
Also for neuro eval as well to confirm, cont aricept for now

## 2014-08-12 NOTE — Patient Instructions (Addendum)
OK to take prilosec 20 mg per day  Ok to cont the generic zofran for nausea  Please drink more fluids in the next few days  Please continue all other medications as before, and refills have been done if requested.  Please have the pharmacy call with any other refills you may need.  Please keep your appointments with your specialists as you may have planned  You will be contacted regarding the referral for: neurology  Please go to the LAB in the Basement (turn left off the elevator) for the tests to be done today  You will be contacted by phone if any changes need to be made immediately.  Otherwise, you will receive a letter about your results with an explanation, but please check with MyChart first.  Please return in 6 months, or sooner if needed

## 2014-08-12 NOTE — Assessment & Plan Note (Signed)
?   Related to his orthostasis, vs other such as PD related lost of postural reflex - for neuro referral as well

## 2014-08-12 NOTE — Telephone Encounter (Signed)
Results faxed.

## 2014-08-12 NOTE — Telephone Encounter (Signed)
Robin to fax psa results to [facility] please

## 2014-08-12 NOTE — Progress Notes (Signed)
Subjective:    Patient ID: [name], male    DOB: [birth year], 68 y.o.   MRN: [id number]  HPI  Here to f/u, has hx of bladder stone per urology nov 2014, urine cx neg, b12 low - taking centrum silver for men - 1 per day, Overall good compliance with treatment, and good medicine tolerability, supervised by wife.  Wfie gives hx - c/o somewhat vague symptoms of feeling ill, she states pt relates nausea, abd pain,? Bloating but pt not able to say for sure, leg pain, leg weakness now to the point she has to move the legs in position with lying down at night, has to pull to get him to stand up, and seems to sway and fall any direction, has ongoing constipation, takes mag citrate occas, also urinary freq with daytime every 1-2 hrs, and nocturia up to 5 times per night, small volumes,has seen urology but over 6 mo ago.Jerelyn Scott x 2 so far overall, last one two days ago, hard backwards into the fireplace, back of head hit the glass door, not seriously injured.   Pt denies fever, wt loss, night sweats, loss of appetite, or other constitutional symptoms, but does c/o being cold all the time.  Has been tried on numerous SSRI and othe antidepr - "none of them worked" so wife has pretty much given up on psych tx. Is on aricept 10 qd, but she is not convinced he actually has dementia. B12 ordered today per Dr Lydia Guiles Readings from Last 3 Encounters:  08/12/14 200 lb (90.719 kg)  12/02/13 200 lb 12 oz (91.06 kg)  11/01/13 197 lb (89.359 kg)  Pt denies chest pain, increased sob or doe, wheezing, orthopnea, PND, increased LE swelling, palpitations, dizziness or syncope. Takes OTC prilosec 1-2 times per wk for heartburn. Denies worsening vomit, diarrhea or blood, but does occas choke on liquids but no solids.  Dementia overall stable symptomaticall, and not assoc with behavioral changes such as hallucinations, paranoia, or agitation. No recent med changes.  Not currently seeing neurology or psychiatry, Wife finds very  difficult to get him to agree to medical care in general. Last significant eval was nov 2014 with neg abd/pelvic ct and cxr, and routine labs except for b12 deficiency.  Had recent gastric empyting per Dr Dennie Fetters  - normal.  [facility] EGD nov 2014 Past Medical History  Diagnosis Date  . Hyperlipidemia   . Vitamin B12 deficiency   . Occasional tremors     leg tremors  . BPH (benign prostatic hypertrophy) with urinary obstruction   . History of CVA (cerebrovascular accident) without residual deficits     2011  . Other degenerative diseases of the basal ganglia     S/P CVA 2011--  CHRONIC LEFT BASAL GANGLIA LACUNAR INFARCT  PER CT  . History of nonmelanoma skin cancer     EXCISION OF NOSE  . Hypertension   . Bladder stone   . Schizophrenia     HX PSYCHIATRIC ADMISSION'S  AND ECT ([place]) [place]  . Bipolar affective disorder   . ED (erectile dysfunction) of organic origin   . Dementia   . Depression   . Constipation   . Generalized weakness     LOWER EXTREMITIES  . Chronic anxiety   . Urinary bladder stone 08/12/2014   Past Surgical History  Procedure Laterality Date  . Transanal excision anal and rectal polyps  11-02-2000  . Transthoracic echocardiogram  02-03-2012    GRADE I DIASTOLIC DYSFUNCTION/  EF 60-65%  . Cardiovascular stress test  06-18-2013  DR Jenkins Rouge    LOW RISK NO EXERCISE LEXISCAN STUDY/  NO ISCHEMIA, CANNOT RULE OUT BASAL INFERIOR INFARCTION BUT POSSIBLE ARTIFACT GIVEN NORMAL WALL MOTION/  EF 73%  . Excision tumor upper left thigh  2012  . Transurethral incision of prostate N/A 10/03/2013    Procedure: TRANSURETHRAL INCISION OF THE PROSTATE (TUIP);  Surgeon: Claybon Jabs, MD;  Location: [facility];  Service: Urology;  Laterality: N/A;  . Cystoscopy with litholapaxy N/A 10/03/2013    Procedure: CYSTOSCOPY WITH LITHOLAPAXY;  Surgeon: Claybon Jabs, MD;  Location: [facility];  Service: Urology;  Laterality: N/A;  . Holmium laser  application N/A 73/42/8768    Procedure: HOLMIUM LASER APPLICATION;  Surgeon: Claybon Jabs, MD;  Location:  [facility];  Service: Urology;  Laterality: N/A;    reports that he has been smoking Cigarettes.  He has a 24 pack-year smoking history. He has never used smokeless tobacco. He reports that he does not drink alcohol or use illicit drugs. family history includes Dementia in his father and mother. There is no history of Colon cancer, Colon polyps, Rectal cancer, or Stomach cancer. No Known Allergies Current Outpatient Prescriptions on File Prior to Visit  Medication Sig Dispense Refill  . ALPRAZolam (XANAX) 0.5 MG tablet TAKE 1 TABLET BY MOUTH TWICE DAILY AS NEEDED  60 tablet  1  . amLODipine (NORVASC) 5 MG tablet Take 5 mg by mouth daily.       Marland Kitchen amLODipine (NORVASC) 5 MG tablet TAKE 1 TABLET BY MOUTH EVERY DAY  90 tablet  3  . benzonatate (TESSALON) 100 MG capsule 1-2 tabs by mouth every 6 hrs as needed for cough  60 capsule  0  . cephALEXin (KEFLEX) 500 MG capsule Take 1 capsule (500 mg total) by mouth 4 (four) times daily.  40 capsule  0  . citalopram (CELEXA) 10 MG tablet Take 1 tablet (10 mg total) by mouth daily.  30 tablet  11  . donepezil (ARICEPT) 10 MG tablet Take 10 mg by mouth every morning.      . donepezil (ARICEPT) 10 MG tablet TAKE 1 TABLET BY MOUTH EVERY DAY  90 tablet  3  . ondansetron (ZOFRAN) 4 MG tablet Take 1 tablet (4 mg total) by mouth every 8 (eight) hours as needed for nausea or vomiting.  30 tablet  0  . promethazine (PHENERGAN) 25 MG tablet Take 25 mg by mouth every 6 (six) hours as needed for nausea.      . promethazine (PHENERGAN) 25 MG tablet Take 1 tablet (25 mg total) by mouth every 6 (six) hours as needed for nausea or vomiting.  12 tablet  0  . zolpidem (AMBIEN) 5 MG tablet Take 1 tablet (5 mg total) by mouth at bedtime as needed for sleep.  30 tablet  5   No current facility-administered medications on file prior to visit.   Review of  Systems  Constitutional: Negative for unusual diaphoresis or other sweats  HENT: Negative for ringing in ear Eyes: Negative for double vision or worsening visual disturbance.  Respiratory: Negative for choking and stridor.   Gastrointestinal: Negative for vomiting or other signifcant bowel change Genitourinary: Negative for hematuria or decreased urine volume.  Musculoskeletal: Negative for other MSK pain or swelling Skin: Negative for color change and worsening wound.  Neurological: Negative for tremors and numbness other than noted  Psychiatric/Behavioral: Negative for decreased concentration  or agitation other than above       Objective:   Physical Exam BP 132/88  Pulse 87  Temp(Src) 98.3 F (36.8 C) (Oral)  Wt 200 lb (90.719 kg)  SpO2 95% orthostatic VS noted VS noted, flatt affect psychomotor retarded, gets up without assist but sways on standing, hesitates with steps to the exam table, able to get up with minimal assist Constitutional: Pt appears well-developed, well-nourished.  HENT: Head: NCAT.  Right Ear: External ear normal.  Left Ear: External ear normal.  Eyes: . Pupils are equal, round, and reactive to light. Conjunctivae and EOM are normal Neck: Normal range of motion. Neck supple.  Cardiovascular: Normal rate and regular rhythm.   Pulmonary/Chest: Effort normal and breath sounds normal.  Abd:  Soft, NT, ND, + BS Neurological: Pt is alert. Mild confused - ? At baseline , motor grossly intact Skin: Skin is warm. No rash, has trace left pedal edema Psychiatric:. No agitation, ? Depressed affect, nervous without right leg moving constantly/fidgety with exam ,     Assessment & Plan:

## 2014-08-12 NOTE — Assessment & Plan Note (Addendum)
Most likely volume related due to inadeq po intake, cant r/o over med tx, or neurological related, encouraged po fluids for now, but if if worsens will need to see ER for IVF's, ECG reviewed as per emr

## 2014-08-15 ENCOUNTER — Inpatient Hospital Stay (HOSPITAL_COMMUNITY)
Admission: EM | Admit: 2014-08-15 | Discharge: 2014-08-17 | DRG: 690 | Disposition: A | Discharge status: Home / Self Care | Payer: Medicare Other | Attending: Internal Medicine | Admitting: Internal Medicine

## 2014-08-15 ENCOUNTER — Encounter (HOSPITAL_COMMUNITY): Payer: Self-pay | Admitting: Emergency Medicine

## 2014-08-15 ENCOUNTER — Emergency Department (HOSPITAL_COMMUNITY): Payer: Medicare Other

## 2014-08-15 DIAGNOSIS — I10 Essential (primary) hypertension: Secondary | ICD-10-CM | POA: Diagnosis present

## 2014-08-15 DIAGNOSIS — R509 Fever, unspecified: Secondary | ICD-10-CM | POA: Diagnosis present

## 2014-08-15 DIAGNOSIS — I5032 Chronic diastolic (congestive) heart failure: Secondary | ICD-10-CM | POA: Diagnosis present

## 2014-08-15 DIAGNOSIS — R110 Nausea: Secondary | ICD-10-CM | POA: Diagnosis present

## 2014-08-15 DIAGNOSIS — F419 Anxiety disorder, unspecified: Secondary | ICD-10-CM

## 2014-08-15 DIAGNOSIS — E876 Hypokalemia: Secondary | ICD-10-CM

## 2014-08-15 DIAGNOSIS — F32A Depression, unspecified: Secondary | ICD-10-CM | POA: Diagnosis present

## 2014-08-15 DIAGNOSIS — F0390 Unspecified dementia without behavioral disturbance: Secondary | ICD-10-CM | POA: Diagnosis present

## 2014-08-15 DIAGNOSIS — R251 Tremor, unspecified: Secondary | ICD-10-CM

## 2014-08-15 DIAGNOSIS — N400 Enlarged prostate without lower urinary tract symptoms: Secondary | ICD-10-CM | POA: Diagnosis present

## 2014-08-15 DIAGNOSIS — Z79899 Other long term (current) drug therapy: Secondary | ICD-10-CM

## 2014-08-15 DIAGNOSIS — K621 Rectal polyp: Secondary | ICD-10-CM

## 2014-08-15 DIAGNOSIS — R531 Weakness: Secondary | ICD-10-CM | POA: Diagnosis present

## 2014-08-15 DIAGNOSIS — R269 Unspecified abnormalities of gait and mobility: Secondary | ICD-10-CM

## 2014-08-15 DIAGNOSIS — F3289 Other specified depressive episodes: Secondary | ICD-10-CM | POA: Diagnosis present

## 2014-08-15 DIAGNOSIS — N390 Urinary tract infection, site not specified: Secondary | ICD-10-CM | POA: Diagnosis present

## 2014-08-15 DIAGNOSIS — I509 Heart failure, unspecified: Secondary | ICD-10-CM | POA: Diagnosis present

## 2014-08-15 DIAGNOSIS — F209 Schizophrenia, unspecified: Secondary | ICD-10-CM | POA: Diagnosis present

## 2014-08-15 DIAGNOSIS — F17200 Nicotine dependence, unspecified, uncomplicated: Secondary | ICD-10-CM | POA: Diagnosis present

## 2014-08-15 DIAGNOSIS — F329 Major depressive disorder, single episode, unspecified: Secondary | ICD-10-CM | POA: Diagnosis present

## 2014-08-15 DIAGNOSIS — Z8673 Personal history of transient ischemic attack (TIA), and cerebral infarction without residual deficits: Secondary | ICD-10-CM | POA: Diagnosis not present

## 2014-08-15 DIAGNOSIS — N320 Bladder-neck obstruction: Secondary | ICD-10-CM

## 2014-08-15 DIAGNOSIS — R1013 Epigastric pain: Secondary | ICD-10-CM | POA: Diagnosis present

## 2014-08-15 DIAGNOSIS — R600 Localized edema: Secondary | ICD-10-CM

## 2014-08-15 DIAGNOSIS — E538 Deficiency of other specified B group vitamins: Secondary | ICD-10-CM

## 2014-08-15 DIAGNOSIS — I639 Cerebral infarction, unspecified: Secondary | ICD-10-CM | POA: Diagnosis present

## 2014-08-15 DIAGNOSIS — K219 Gastro-esophageal reflux disease without esophagitis: Secondary | ICD-10-CM | POA: Diagnosis present

## 2014-08-15 DIAGNOSIS — R350 Frequency of micturition: Secondary | ICD-10-CM

## 2014-08-15 DIAGNOSIS — E785 Hyperlipidemia, unspecified: Secondary | ICD-10-CM | POA: Diagnosis present

## 2014-08-15 DIAGNOSIS — K760 Fatty (change of) liver, not elsewhere classified: Secondary | ICD-10-CM

## 2014-08-15 DIAGNOSIS — I951 Orthostatic hypotension: Secondary | ICD-10-CM | POA: Diagnosis present

## 2014-08-15 DIAGNOSIS — R5383 Other fatigue: Secondary | ICD-10-CM

## 2014-08-15 DIAGNOSIS — R609 Edema, unspecified: Secondary | ICD-10-CM

## 2014-08-15 DIAGNOSIS — R5381 Other malaise: Secondary | ICD-10-CM

## 2014-08-15 DIAGNOSIS — C44301 Unspecified malignant neoplasm of skin of nose: Secondary | ICD-10-CM

## 2014-08-15 DIAGNOSIS — Z0000 Encounter for general adult medical examination without abnormal findings: Secondary | ICD-10-CM

## 2014-08-15 DIAGNOSIS — J069 Acute upper respiratory infection, unspecified: Secondary | ICD-10-CM

## 2014-08-15 DIAGNOSIS — N3281 Overactive bladder: Secondary | ICD-10-CM

## 2014-08-15 DIAGNOSIS — R7302 Impaired glucose tolerance (oral): Secondary | ICD-10-CM

## 2014-08-15 DIAGNOSIS — N210 Calculus in bladder: Secondary | ICD-10-CM

## 2014-08-15 DIAGNOSIS — L989 Disorder of the skin and subcutaneous tissue, unspecified: Secondary | ICD-10-CM

## 2014-08-15 LAB — CBC WITH DIFFERENTIAL/PLATELET
Basophils Absolute: 0 10*3/uL (ref 0.0–0.1)
Basophils Relative: 0 % (ref 0–1)
Eosinophils Absolute: 0 10*3/uL (ref 0.0–0.7)
Eosinophils Relative: 0 % (ref 0–5)
HCT: 45.3 % (ref 39.0–52.0)
Hemoglobin: 15.9 g/dL (ref 13.0–17.0)
Lymphocytes Relative: 9 % — ABNORMAL LOW (ref 12–46)
Lymphs Abs: 0.7 10*3/uL (ref 0.7–4.0)
MCH: 31.8 pg (ref 26.0–34.0)
MCHC: 35.1 g/dL (ref 30.0–36.0)
MCV: 90.6 fL (ref 78.0–100.0)
Monocytes Absolute: 0.6 10*3/uL (ref 0.1–1.0)
Monocytes Relative: 8 % (ref 3–12)
Neutro Abs: 6.3 10*3/uL (ref 1.7–7.7)
Neutrophils Relative %: 83 % — ABNORMAL HIGH (ref 43–77)
Platelets: 198 10*3/uL (ref 150–400)
RBC: 5 MIL/uL (ref 4.22–5.81)
RDW: 12.6 % (ref 11.5–15.5)
WBC: 7.7 10*3/uL (ref 4.0–10.5)

## 2014-08-15 LAB — URINALYSIS, ROUTINE W REFLEX MICROSCOPIC
Bilirubin Urine: NEGATIVE
Glucose, UA: NEGATIVE mg/dL
Ketones, ur: NEGATIVE mg/dL
Nitrite: NEGATIVE
Protein, ur: NEGATIVE mg/dL
Specific Gravity, Urine: 1.017 (ref 1.005–1.030)
Urobilinogen, UA: 1 mg/dL (ref 0.0–1.0)
pH: 6 (ref 5.0–8.0)

## 2014-08-15 LAB — BASIC METABOLIC PANEL
Anion gap: 14 (ref 5–15)
BUN: 14 mg/dL (ref 6–23)
CO2: 24 mEq/L (ref 19–32)
Calcium: 9.2 mg/dL (ref 8.4–10.5)
Chloride: 102 mEq/L (ref 96–112)
Creatinine, Ser: 0.99 mg/dL (ref 0.50–1.35)
GFR calc Af Amer: >90 mL/min (ref >90)
GFR calc non Af Amer: 82 mL/min — ABNORMAL LOW (ref >90)
Glucose, Bld: 114 mg/dL — ABNORMAL HIGH (ref 70–99)
Potassium: 3.8 mEq/L (ref 3.7–5.3)
Sodium: 140 mEq/L (ref 137–147)

## 2014-08-15 LAB — URINE MICROSCOPIC-ADD ON

## 2014-08-15 LAB — HEPATIC FUNCTION PANEL
ALT: 17 U/L (ref 0–53)
AST: 16 U/L (ref 0–37)
Albumin: 3.7 g/dL (ref 3.5–5.2)
Alkaline Phosphatase: 91 U/L (ref 39–117)
Bilirubin, Direct: <0.2 mg/dL (ref 0.0–0.3)
Total Bilirubin: 0.4 mg/dL (ref 0.3–1.2)
Total Protein: 6.5 g/dL (ref 6.0–8.3)

## 2014-08-15 LAB — PROCALCITONIN

## 2014-08-15 LAB — TROPONIN I
Troponin I: <0.3 ng/mL (ref <0.30)
Troponin I: <0.3 ng/mL (ref <0.30)

## 2014-08-15 LAB — LACTIC ACID, PLASMA: Lactic Acid, Venous: 0.9 mmol/L (ref 0.5–2.2)

## 2014-08-15 LAB — MAGNESIUM: Magnesium: 2 mg/dL (ref 1.5–2.5)

## 2014-08-15 MED ORDER — IPRATROPIUM BROMIDE 0.02 % IN SOLN: 0.5000 mg | RESPIRATORY_TRACT | Status: DC | PRN

## 2014-08-15 MED ORDER — OMEGA-3-ACID ETHYL ESTERS 1 G PO CAPS
3.0000 g | ORAL_CAPSULE | Freq: Every day | ORAL | Status: DC
  Administered 2014-08-16 – 2014-08-17 (×2): 3 g via ORAL
  Filled 2014-08-15 (×2): qty 3

## 2014-08-15 MED ORDER — ACETAMINOPHEN 325 MG PO TABS
650.0000 mg | ORAL_TABLET | Freq: Once | ORAL | Status: AC
Start: 2014-08-15 — End: 2014-08-15
  Administered 2014-08-15: 650 mg via ORAL
  Filled 2014-08-15: qty 2

## 2014-08-15 MED ORDER — POLYETHYLENE GLYCOL 3350 17 G PO PACK
17.0000 g | PACK | Freq: Every day | ORAL | Status: DC | PRN
Start: 2014-08-15 — End: 2014-08-17
  Filled 2014-08-15: qty 1

## 2014-08-15 MED ORDER — ONDANSETRON HCL 4 MG PO TABS
4.0000 mg | ORAL_TABLET | Freq: Four times a day (QID) | ORAL | Status: DC | PRN
  Administered 2014-08-15 – 2014-08-17 (×4): 4 mg via ORAL
  Filled 2014-08-15 (×4): qty 1

## 2014-08-15 MED ORDER — ALPRAZOLAM 0.5 MG PO TABS
0.5000 mg | ORAL_TABLET | Freq: Two times a day (BID) | ORAL | Status: DC | PRN
  Administered 2014-08-15 – 2014-08-17 (×4): 0.5 mg via ORAL
  Filled 2014-08-15 (×4): qty 1

## 2014-08-15 MED ORDER — FLAX SEED OIL 1000 MG PO CAPS: 1000.0000 mg | ORAL_CAPSULE | Freq: Two times a day (BID) | ORAL | Status: DC

## 2014-08-15 MED ORDER — ENOXAPARIN SODIUM 40 MG/0.4ML ~~LOC~~ SOLN
40.0000 mg | SUBCUTANEOUS | Status: DC
  Administered 2014-08-15 – 2014-08-16 (×2): 40 mg via SUBCUTANEOUS
  Filled 2014-08-15 (×5): qty 0.4

## 2014-08-15 MED ORDER — LORAZEPAM 2 MG/ML IJ SOLN
1.0000 mg | Freq: Once | INTRAMUSCULAR | Status: AC
  Administered 2014-08-15: 1 mg via INTRAVENOUS
  Filled 2014-08-15: qty 1

## 2014-08-15 MED ORDER — PIPERACILLIN-TAZOBACTAM 3.375 G IVPB
3.3750 g | Freq: Three times a day (TID) | INTRAVENOUS | Status: DC
  Administered 2014-08-16 – 2014-08-17 (×4): 3.375 g via INTRAVENOUS
  Filled 2014-08-15 (×5): qty 50

## 2014-08-15 MED ORDER — MAGNESIUM CITRATE PO SOLN
1.0000 | Freq: Once | ORAL | Status: AC | PRN
Start: 2014-08-15 — End: 2014-08-15

## 2014-08-15 MED ORDER — SORBITOL 70 % SOLN
30.0000 mL | Freq: Every day | Status: DC | PRN
  Filled 2014-08-15: qty 30

## 2014-08-15 MED ORDER — ONDANSETRON HCL 4 MG/2ML IJ SOLN
4.0000 mg | Freq: Four times a day (QID) | INTRAMUSCULAR | Status: DC | PRN
  Administered 2014-08-16 – 2014-08-17 (×2): 4 mg via INTRAVENOUS
  Filled 2014-08-15 (×2): qty 2

## 2014-08-15 MED ORDER — PROMETHAZINE HCL 25 MG/ML IJ SOLN
12.5000 mg | Freq: Once | INTRAMUSCULAR | Status: AC
  Administered 2014-08-15: 12.5 mg via INTRAVENOUS
  Filled 2014-08-15: qty 1

## 2014-08-15 MED ORDER — ALBUTEROL SULFATE (2.5 MG/3ML) 0.083% IN NEBU: 2.5000 mg | INHALATION_SOLUTION | RESPIRATORY_TRACT | Status: DC | PRN

## 2014-08-15 MED ORDER — ACETAMINOPHEN 325 MG PO TABS
650.0000 mg | ORAL_TABLET | ORAL | Status: DC | PRN
  Administered 2014-08-16: 650 mg via ORAL
  Filled 2014-08-15 (×3): qty 2

## 2014-08-15 MED ORDER — PIPERACILLIN-TAZOBACTAM 3.375 G IVPB 30 MIN
3.3750 g | INTRAVENOUS | Status: AC
  Administered 2014-08-15: 3.375 g via INTRAVENOUS
  Filled 2014-08-15: qty 50

## 2014-08-15 MED ORDER — PANTOPRAZOLE SODIUM 40 MG PO TBEC
40.0000 mg | DELAYED_RELEASE_TABLET | Freq: Every day | ORAL | Status: DC
  Administered 2014-08-16 – 2014-08-17 (×2): 40 mg via ORAL
  Filled 2014-08-15 (×4): qty 1

## 2014-08-15 MED ORDER — SODIUM CHLORIDE 0.9 % IV SOLN
INTRAVENOUS | Status: DC
  Administered 2014-08-15 – 2014-08-16 (×4): via INTRAVENOUS

## 2014-08-15 MED ORDER — DOCUSATE SODIUM 100 MG PO CAPS
100.0000 mg | ORAL_CAPSULE | Freq: Two times a day (BID) | ORAL | Status: DC
  Administered 2014-08-15 – 2014-08-17 (×4): 100 mg via ORAL
  Filled 2014-08-15 (×6): qty 1

## 2014-08-15 MED ORDER — NICOTINE 14 MG/24HR TD PT24
14.0000 mg | MEDICATED_PATCH | Freq: Every day | TRANSDERMAL | Status: DC
  Administered 2014-08-15 – 2014-08-17 (×3): 14 mg via TRANSDERMAL
  Filled 2014-08-15 (×3): qty 1

## 2014-08-15 MED ORDER — SODIUM CHLORIDE 0.9 % IV BOLUS (SEPSIS)
500.0000 mL | Freq: Once | INTRAVENOUS | Status: AC
  Administered 2014-08-15: 500 mL via INTRAVENOUS

## 2014-08-15 MED ORDER — ONDANSETRON HCL 4 MG/2ML IJ SOLN
4.0000 mg | Freq: Once | INTRAMUSCULAR | Status: AC
  Administered 2014-08-15: 4 mg via INTRAVENOUS
  Filled 2014-08-15: qty 2

## 2014-08-15 MED ORDER — CIPROFLOXACIN IN D5W 400 MG/200ML IV SOLN
400.0000 mg | Freq: Once | INTRAVENOUS | Status: AC
  Administered 2014-08-15: 400 mg via INTRAVENOUS
  Filled 2014-08-15: qty 200

## 2014-08-15 MED ORDER — IBUPROFEN 200 MG PO TABS
400.0000 mg | ORAL_TABLET | Freq: Once | ORAL | Status: AC
  Administered 2014-08-15: 400 mg via ORAL
  Filled 2014-08-15: qty 2

## 2014-08-15 MED ORDER — INFLUENZA VAC SPLIT QUAD 0.5 ML IM SUSY
0.5000 mL | PREFILLED_SYRINGE | INTRAMUSCULAR | Status: AC
  Administered 2014-08-16: 0.5 mL via INTRAMUSCULAR
  Filled 2014-08-15 (×2): qty 0.5

## 2014-08-15 NOTE — ED Provider Notes (Signed)
CSN: [id number]     Arrival date & time 08/15/14  1205 History   First MD Initiated Contact with Patient 08/15/14 1234     Chief Complaint  Patient presents with  . Weakness  . Nausea     (Consider location/radiation/quality/duration/timing/severity/associated sxs/prior Treatment) HPI  68yM with generalized weakness. Additional hx from wife of 25 years and medical records. Generalized weakness, nausea and urinary frequency. Symptoms starting possibly as far back as a few weeks ago, but significantly worse in past week. Seen by PCP on Thursday for same. Work up then significant for likely UTI on started on keflex. Has been taking along with zofran but still remains with urinary frequency and feeling nauseated and very weak. Did fall over a week ago, but not other recent trauma. No documented fever but has felt chilled. Anorexia. Denies abdominal pain, actual vomiting or any diarrhea. No sick contacts. Wife administers medications. Reports compliance.    Past Medical History  Diagnosis Date  . Hyperlipidemia   . Vitamin B12 deficiency   . Occasional tremors     leg tremors  . BPH (benign prostatic hypertrophy) with urinary obstruction   . History of CVA (cerebrovascular accident) without residual deficits     2011  . Other degenerative diseases of the basal ganglia     S/P CVA 2011--  CHRONIC LEFT BASAL GANGLIA LACUNAR INFARCT  PER CT  . History of nonmelanoma skin cancer     EXCISION OF NOSE  . Hypertension   . Bladder stone   . Schizophrenia     HX PSYCHIATRIC ADMISSION'S  AND ECT ([place]) [place]  . Bipolar affective disorder   . ED (erectile dysfunction) of organic origin   . Dementia   . Depression   . Constipation   . Generalized weakness     LOWER EXTREMITIES  . Chronic anxiety   . Urinary bladder stone 08/12/2014   Past Surgical History  Procedure Laterality Date  . Transanal excision anal and rectal polyps  11-02-2000  . Transthoracic echocardiogram  02-03-2012    GRADE  I DIASTOLIC DYSFUNCTION/  EF 60-65%  . Cardiovascular stress test  06-18-2013  DR Jenkins Rouge    LOW RISK NO EXERCISE LEXISCAN STUDY/  NO ISCHEMIA, CANNOT RULE OUT BASAL INFERIOR INFARCTION BUT POSSIBLE ARTIFACT GIVEN NORMAL WALL MOTION/  EF 73%  . Excision tumor upper left thigh  2012  . Transurethral incision of prostate N/A 10/03/2013    Procedure: TRANSURETHRAL INCISION OF THE PROSTATE (TUIP);  Surgeon: Claybon Jabs, MD;  Location[facility];  Service: Urology;  Laterality: N/A;  . Cystoscopy with litholapaxy N/A 10/03/2013    Procedure: CYSTOSCOPY WITH LITHOLAPAXY;  Surgeon: Claybon Jabs, MD;  Location: [facility];  Service: Urology;  Laterality: N/A;  . Holmium laser application N/A 98/92/1194    Procedure: HOLMIUM LASER APPLICATION;  Surgeon: Claybon Jabs, MD;  Location: [facility];  Service: Urology;  Laterality: N/A;   Family History  Problem Relation Age of Onset  . Dementia Father   . Dementia Mother   . Colon cancer Neg Hx   . Colon polyps Neg Hx   . Rectal cancer Neg Hx   . Stomach cancer Neg Hx    History  Substance Use Topics  . Smoking status: Current Every Day Smoker -- 0.50 packs/day for 48 years    Types: Cigarettes  . Smokeless tobacco: Never Used  . Alcohol Use: No    Review of Systems  All systems reviewed and negative, other than as noted in HPI.   Allergies  Review of patient's allergies indicates no known allergies.  Home Medications   Prior to Admission medications   Medication Sig Start Date End Date Taking? Authorizing Provider  ALPRAZolam Duanne Moron) 0.5 MG tablet TAKE 1 TABLET BY MOUTH TWICE DAILY AS NEEDED   Yes Biagio Borg, MD  amLODipine (NORVASC) 5 MG tablet Take 5 mg by mouth daily.  08/06/13  Yes Historical Provider, MD  cephALEXin (KEFLEX) 500 MG capsule Take 1 capsule (500 mg total) by mouth 4 (four) times daily. 08/12/14  Yes Biagio Borg, MD  Flaxseed, Linseed, (FLAX SEED OIL) 1000 MG  CAPS Take 1,000 mg by mouth 2 (two) times daily.   Yes Historical Provider, MD  omega-3 acid ethyl esters (LOVAZA) 1 G capsule Take 3 g by mouth daily.   Yes Historical Provider, MD  omeprazole (PRILOSEC) 20 MG capsule Take 1 capsule (20 mg total) by mouth daily. 08/12/14  Yes Biagio Borg, MD  ondansetron (ZOFRAN) 4 MG tablet Take 1 tablet (4 mg total) by mouth every 8 (eight) hours as needed for nausea or vomiting. 08/12/14  Yes Biagio Borg, MD   BP 155/89  Pulse 84  Temp(Src) 99.1 F (37.3 C) (Oral)  Resp 16  Wt 192 lb 9 oz (87.346 kg)  SpO2 95% Physical Exam  Nursing note and vitals reviewed. Constitutional: He appears well-developed and well-nourished. No distress.  HENT:  Head: Normocephalic and atraumatic.  Eyes: Conjunctivae are normal. Pupils are equal, round, and reactive to light. Right eye exhibits no discharge. Left eye exhibits no discharge.  Neck: Neck supple.  Cardiovascular: Normal rate, regular rhythm and normal heart sounds.  Exam reveals no gallop and no friction rub.   No murmur heard. Pulmonary/Chest: Effort normal and breath sounds normal. No respiratory distress.  Abdominal: Soft. He exhibits no distension. There is no tenderness.  Musculoskeletal: He exhibits no edema and no tenderness.  Neurological: He is alert.  Speech slow and voice tends to trail off. Content appropriate. Joking around some. CN intact. Strength 5/5 b/l u/l extremities. I didn't appreciate a resting tremor but one noted on finger to nose. No past pointing.   Skin: Skin is warm and dry.  Psychiatric: His behavior is normal. Thought content normal.  Flat affect    ED Course  Procedures (including critical care time) Labs Review Labs Reviewed  CBC WITH DIFFERENTIAL - Abnormal; Notable for the following:    Neutrophils Relative % 83 (*)    Lymphocytes Relative 9 (*)    All other components within normal limits  BASIC METABOLIC PANEL - Abnormal; Notable for the following:    Glucose, Bld  114 (*)    GFR calc non Af Amer 82 (*)    All other components within normal limits  URINALYSIS, ROUTINE W REFLEX MICROSCOPIC - Abnormal; Notable for the following:    APPearance CLOUDY (*)    Hgb urine dipstick LARGE (*)    Leukocytes, UA SMALL (*)    All other components within normal limits  CULTURE, BLOOD (ROUTINE X 2)  CULTURE, BLOOD (ROUTINE X 2)  TROPONIN I  URINE MICROSCOPIC-ADD ON  LACTIC ACID, PLASMA    Imaging Review Dg Chest 2 View  08/15/2014   CLINICAL DATA:  Weakness.  Nausea.  EXAM: CHEST  2 VIEW  COMPARISON:  11/01/2013.  FINDINGS: There are small bilateral pleural effusions. No airspace disease. Cardiopericardial silhouette appears within normal limits for  projection. Tortuous thoracic aorta. Monitoring leads project over the chest.  IMPRESSION: Small bilateral pleural effusions.   Electronically Signed   By: Dereck Ligas M.D.   On: 08/15/2014 14:19   Ct Head Wo Contrast  08/15/2014   CLINICAL DATA:  Fall.  Nausea.  Increased urinary frequency.  EXAM: CT HEAD WITHOUT CONTRAST  TECHNIQUE: Contiguous axial images were obtained from the base of the skull through the vertex without intravenous contrast.  COMPARISON:  CT head 11/07/2010  FINDINGS: Stable mild cerebral volume loss. Ventricles are normal and stable in size. Negative for intra or extra-axial hemorrhage, mass lesion, mass effect, or acute cortically based based infarction. Skull is intact. Visualized paranasal sinuses mastoid air cells are clear.  IMPRESSION: Stable head CT.  No acute intracranial abnormality.   Electronically Signed   By: Curlene Dolphin M.D.   On: 08/15/2014 14:21     EKG Interpretation None      MDM   Final diagnoses:  Fever, unspecified fever cause  UTI (lower urinary tract infection)     68yM with with generalized weakness. Febrile to 103. Recently diagnosed UTI. On keflex. UA today looks better but remains with blood and small leuks. Urinary symptoms have not improved though. Pt is  very shaky although no focal motor deficits. No urine culture results. I feel prudent to admit for further eval at this point.     Virgel Manifold, MD 08/20/14 1515

## 2014-08-15 NOTE — Progress Notes (Signed)
PHARMACIST - PHYSICIAN ORDER COMMUNICATION  CONCERNING: P&T Medication Policy on Herbal Medications  DESCRIPTION:  This patient's order for:  Flax Seed Oil  has been noted.  This product(s) is classified as an "herbal" or natural product. Due to a lack of definitive safety studies or FDA approval, nonstandard manufacturing practices, plus the potential risk of unknown drug-drug interactions while on inpatient medications, the Pharmacy and Therapeutics Committee does not permit the use of "herbal" or natural products of this type within [facility].   ACTION TAKEN: The pharmacy department is unable to verify this order at this time and your patient has been informed of this safety policy. Please reevaluate patient's clinical condition at discharge and address if the herbal or natural product(s) should be resumed at that time.   Ralene Bathe, PharmD, BCPS 08/15/2014, 5:47 PM  Pager: [phone]

## 2014-08-15 NOTE — Progress Notes (Signed)
ANTIBIOTIC CONSULT NOTE - INITIAL  Pharmacy Consult for Zosyn Indication: UTI/febrile  No Known Allergies  Patient Measurements: Weight: 192 lb 9 oz (87.346 kg)  Vital Signs: Temp: 100.1 F (37.8 C) (09/12 1652) Temp src: Rectal (09/12 1652) BP: 147/91 mmHg (09/12 1615) Pulse Rate: 77 (09/12 1630) Intake/Output from previous day:   Intake/Output from this shift:    Labs:  Recent Labs  08/15/14 1313  WBC 7.7  HGB 15.9  PLT 198  CREATININE 0.99   The CrCl is unknown because both a height and weight (above a minimum accepted value) are required for this calculation. No results found for this basename: VANCOTROUGH, VANCOPEAK, VANCORANDOM, GENTTROUGH, GENTPEAK, GENTRANDOM, TOBRATROUGH, TOBRAPEAK, TOBRARND, AMIKACINPEAK, AMIKACINTROU, AMIKACIN,  in the last 72 hours   Microbiology: No results found for this or any previous visit (from the past 720 hour(s)).  Medical History: Past Medical History  Diagnosis Date  . Hyperlipidemia   . Vitamin B12 deficiency   . Occasional tremors     leg tremors  . BPH (benign prostatic hypertrophy) with urinary obstruction   . History of CVA (cerebrovascular accident) without residual deficits     2011  . Other degenerative diseases of the basal ganglia     S/P CVA 2011--  CHRONIC LEFT BASAL GANGLIA LACUNAR INFARCT  PER CT  . History of nonmelanoma skin cancer     EXCISION OF NOSE  . Hypertension   . Bladder stone   . Schizophrenia     HX PSYCHIATRIC ADMISSION'S  AND ECT ([place]) [place]  . Bipolar affective disorder   . ED (erectile dysfunction) of organic origin   . Dementia   . Depression   . Constipation   . Generalized weakness     LOWER EXTREMITIES  . Chronic anxiety   . Urinary bladder stone 08/12/2014     Assessment: 68yM with with generalized weakness. Febrile to 103. Recently diagnosed UTI. On keflex. UA today looks better but remains with blood and small leuks. Urinary symptoms have not improved though. Pt is very shaky  although no focal motor deficits. No urine culture results. Pharmacy consulted for Zosyn   Goal of Therapy:   Eradication of infection  Zosyn per renal function  Plan:   Zosyn 3.375mg  IV q8h  Follow renal function and cultures  Dolly Rias RPh 08/15/2014, 5:20 PM Pager [phone]

## 2014-08-15 NOTE — ED Notes (Signed)
Pt reports that he has had increased urinary frequency, nausea, and weakness that began a month ago.  Pt reports being seen in the office Thursday and was diagnosed with a UTI and also found a elevated PSA. Pt also reports being seen by neurology due to varied orthostatic BP results and also for dementia evaluation. Pt states that he hasn't felt better since being seen in the office and called the office this morning and was referred to the ED.

## 2014-08-15 NOTE — H&P (Signed)
[facility] History and Physical  [name] VVO:[id number] DOB: 20-Oct-1946 DOA: 08/15/2014  Referring physician: Dr. Wilson Singer PCP: Cathlean Cower, MD   Chief Complaint: Generalized weakness/ nausea  HPI: [name] is a 68 y.o. male  With history of bladder stone per urology November of 2040, BPH, hyperlipidemia, B12 deficiency, prior history of CVA without residual deficits, hypertension, schizophrenia, bipolar disorder, depression who presents to the ED with a several month history of generalized weakness, nausea, urinary urgency and frequency, dysuria, chills, nausea. Per wife patient was noted to be very weak that 1 week prior to admission patient fell backward into the fireplace. Patient's wife denies any fevers, no abdominal pain, no emesis, no chest pain, no shortness of breath, no melena, no hematemesis, no hematochezia. Per patient's wife the patient saw PCP 3 days prior to admission with symptoms of generalized weakness and urinary urgency or frequency and was diagnosed with a urinary tract infection and started on Keflex. He was also noted her PCPs office the patient was orthostatic. Patient's wife states that patient has been compliant with his antibiotics however was not improving and a such patient was brought to the emergency room. In the emergency room basic metabolic profile done was unremarkable. Initial troponin was negative. CBC done was unremarkable. Urinalysis which was done was nitrite negative small leukocytes large hemoglobin which had improved from urinalysis which was done on 08/12/2014 that was nitrite positive with trace leukocytes and 11-20 WBCs. Chest x-ray which was done was negative for any acute infiltrate, however showed small bilateral pleural effusions. Blood cultures were ordered. Patient was given a dose of IV ciprofloxacin. We were called to admit the patient for further evaluation and management.   Review of Systems: As per history of present illness  otherwise negative. Constitutional:  No weight loss, night sweats, Fevers,  fatigue.  HEENT:  No headaches, Difficulty swallowing,Tooth/dental problems,Sore throat,  No sneezing, itching, ear ache, nasal congestion, post nasal drip,  Cardio-vascular:  No chest pain, Orthopnea, PND, swelling in lower extremities, anasarca,  palpitations  GI:  No heartburn, indigestion, abdominal pain, nausea, vomiting, diarrhea, change in bowel habits,  Resp:  No shortness of breath with exertion or at rest. No excess mucus, no productive cough, No non-productive cough, No coughing up of blood.No change in color of mucus.No wheezing.No chest wall deformity  Skin:  no rash or lesions.  GU:  No flank pain.  Musculoskeletal:  No joint pain or swelling. No decreased range of motion. No back pain.  Psych:  No change in mood or affect. No depression or anxiety. No memory loss.   Past Medical History  Diagnosis Date  . Hyperlipidemia   . Vitamin B12 deficiency   . Occasional tremors     leg tremors  . BPH (benign prostatic hypertrophy) with urinary obstruction   . History of CVA (cerebrovascular accident) without residual deficits     2011  . Other degenerative diseases of the basal ganglia     S/P CVA 2011--  CHRONIC LEFT BASAL GANGLIA LACUNAR INFARCT  PER CT  . History of nonmelanoma skin cancer     EXCISION OF NOSE  . Hypertension   . Bladder stone   . Schizophrenia     HX PSYCHIATRIC ADMISSION'S  AND ECT (SH[place]) TX  . Bipolar affective disorder   . ED (erectile dysfunction) of organic origin   . Dementia   . Depression   . Constipation   . Generalized weakness     LOWER EXTREMITIES  .  Chronic anxiety   . Urinary bladder stone 08/12/2014   Past Surgical History  Procedure Laterality Date  . Transanal excision anal and rectal polyps  11-02-2000  . Transthoracic echocardiogram  02-03-2012    GRADE I DIASTOLIC DYSFUNCTION/  EF 60-65%  . Cardiovascular stress test  06-18-2013  DR Jenkins [place]    LOW RISK NO EXERCISE LEXISCAN STUDY/  NO ISCHEMIA, CANNOT RULE OUT BASAL INFERIOR INFARCTION BUT POSSIBLE ARTIFACT GIVEN NORMAL WALL MOTION/  EF 73%  . Excision tumor upper left thigh  2012  . Transurethral incision of prostate N/A 10/03/2013    Procedure: TRANSURETHRAL INCISION OF THE PROSTATE (TUIP);  Surgeon: Claybon Jabs, MD;  Location: [facility];  Service: Urology;  Laterality: N/A;  . Cystoscopy with litholapaxy N/A 10/03/2013    Procedure: CYSTOSCOPY WITH LITHOLAPAXY;  Surgeon: Claybon Jabs, MD;  Location: [facility];  Service: Urology;  Laterality: N/A;  . Holmium laser application N/A [id number]    Procedure: HOLMIUM LASER APPLICATION;  Surgeon: Claybon Jabs, MD;  Location: [facility];  Service: Urology;  Laterality: N/A;   Social History:  reports that he has been smoking Cigarettes.  He has a 24 pack-year smoking history. He has never used smokeless tobacco. He reports that he does not drink alcohol or use illicit drugs.  No Known Allergies  Family History  Problem Relation Age of Onset  . Dementia Father   . Dementia Mother   . Colon cancer Neg Hx   . Colon polyps Neg Hx   . Rectal cancer Neg Hx   . Stomach cancer Neg Hx      Prior to Admission medications   Medication Sig Start Date End Date Taking? Authorizing Provider  ALPRAZolam Duanne Moron) 0.5 MG tablet TAKE 1 TABLET BY MOUTH TWICE DAILY AS NEEDED   Yes Biagio Borg, MD  amLODipine (NORVASC) 5 MG tablet Take 5 mg by mouth daily.  08/06/13  Yes Historical Provider, MD  cephALEXin (KEFLEX) 500 MG capsule Take 1 capsule (500 mg total) by mouth 4 (four) times daily. 08/12/14  Yes Biagio Borg, MD  Flaxseed, Linseed, (FLAX SEED OIL) 1000 MG CAPS Take 1,000 mg by mouth 2 (two) times daily.   Yes Historical Provider, MD  omega-3 acid ethyl esters (LOVAZA) 1 G capsule Take 3 g by mouth daily.   Yes Historical Provider, MD  omeprazole (PRILOSEC) 20 MG capsule Take 1  capsule (20 mg total) by mouth daily. 08/12/14  Yes Biagio Borg, MD  ondansetron (ZOFRAN) 4 MG tablet Take 1 tablet (4 mg total) by mouth every 8 (eight) hours as needed for nausea or vomiting. 08/12/14  Yes Biagio Borg, MD   Physical Exam: Filed Vitals:   08/15/14 1600 08/15/14 1615 08/15/14 1630 08/15/14 1652  BP: 165/98 147/91    Pulse: 87 79 77   Temp:    100.1 F (37.8 C)  TempSrc:    Rectal  Resp: 18 14 14    Weight:      SpO2: 95% 94% 94%     Wt Readings from Last 3 Encounters:  08/15/14 87.346 kg (192 lb 9 oz)  08/12/14 90.719 kg (200 lb)  12/02/13 91.06 kg (200 lb 12 oz)    General:  Elderly gentleman laying on gurney in no acute cardiopulmonary distress. Eyes: PERRLA, EOMI, normal lids, irises & conjunctiva ENT: grossly normal hearing, lips & tongue. Dry mucous membranes Neck: no LAD, masses or thyromegaly Cardiovascular: RRR, no  m/r/g. No LE edema Respiratory: CTA bilaterally, no w/r/r. Normal respiratory effort. Abdomen: soft, ntnd, positive bowel sounds. Skin: no rash or induration seen on limited exam Musculoskeletal: grossly normal tone BUE/BLE Psychiatric: grossly normal mood and affect, speech fluent and appropriate Neurologic: Alert and oriented. Cranial nerves II through XII are grossly intact. Sensation is intact. Visual fields are intact. Gait not tested secondary to safety.           Labs on Admission:  Basic Metabolic Panel:  Recent Labs Lab 08/12/14 0952 08/15/14 1313  NA 142 140  K 3.8 3.8  CL 106 102  CO2 28 24  GLUCOSE 110* 114*  BUN 13 14  CREATININE 0.9 0.99  CALCIUM 9.4 9.2   Liver Function Tests:  Recent Labs Lab 08/12/14 0952  AST 19  ALT 21  ALKPHOS 96  BILITOT 0.7  PROT 7.2  ALBUMIN 4.3   No results found for this basename: LIPASE, AMYLASE,  in the last 168 hours No results found for this basename: AMMONIA,  in the last 168 hours CBC:  Recent Labs Lab 08/12/14 0952 08/15/14 1313  WBC 9.1 7.7  NEUTROABS 6.8 6.3    HGB 16.4 15.9  HCT 48.0 45.3  MCV 93.8 90.6  PLT 238.0 198   Cardiac Enzymes:  Recent Labs Lab 08/15/14 1313  TROPONINI <0.30    BNP (last 3 results) No results found for this basename: PROBNP,  in the last 8760 hours CBG: No results found for this basename: GLUCAP,  in the last 168 hours  Radiological Exams on Admission: Dg Chest 2 View  08/15/2014   CLINICAL DATA:  Weakness.  Nausea.  EXAM: CHEST  2 VIEW  COMPARISON:  11/01/2013.  FINDINGS: There are small bilateral pleural effusions. No airspace disease. Cardiopericardial silhouette appears within normal limits for projection. Tortuous thoracic aorta. Monitoring leads project over the chest.  IMPRESSION: Small bilateral pleural effusions.   Electronically Signed   By: Dereck Ligas M.D.   On: 08/15/2014 14:19   Ct Head Wo Contrast  08/15/2014   CLINICAL DATA:  Fall.  Nausea.  Increased urinary frequency.  EXAM: CT HEAD WITHOUT CONTRAST  TECHNIQUE: Contiguous axial images were obtained from the base of the skull through the vertex without intravenous contrast.  COMPARISON:  CT head 11/07/2010  FINDINGS: Stable mild cerebral volume loss. Ventricles are normal and stable in size. Negative for intra or extra-axial hemorrhage, mass lesion, mass effect, or acute cortically based based infarction. Skull is intact. Visualized paranasal sinuses mastoid air cells are clear.  IMPRESSION: Stable head CT.  No acute intracranial abnormality.   Electronically Signed   By: Curlene Dolphin M.D.   On: 08/15/2014 14:21    EKG: Independently reviewed. T-wave inversion in lead II, poor R-wave progression.  Assessment/Plan Principal Problem:   Fever Active Problems:   UTI (lower urinary tract infection)   Weakness generalized   Dementia   HTN (hypertension)   BPH (benign prostatic hyperplasia)   Schizophrenia   Hyperlipidemia   Stroke   Depression   GERD (gastroesophageal reflux disease)   Dyspepsia   Chronic nausea   Orthostatic  hypotension   #1 fever Unknown etiology. Likely secondary to recently diagnosed urinary tract infection. Could not find urine cultures. Repeat urinalysis showed some improvement. Will check urine cultures. Check blood cultures x2. Check a lactic acid level. Check Procalcitonin level. Will repeat chest x-ray in the morning after patient has been hydrated. We'll place empirically on IV Zosyn.  #2 recently diagnosed  UTI Unable to obtain urine cultures from recent urinalysis done at PCPs office. Will check urine cultures. Place empirically on IV Zosyn.  #3 generalized weakness Likely secondary to acute infection. The patient has been pancultured. Patient was recently diagnosed with a UTI. Will check urine cultures. Placed empirically on IV Zosyn. Will cycle cardiac enzymes every 6 hours x3. Check a 2-D echo as well. PT/OT. Follow.  #4 orthostatic hypotension Patient noted to be orthostatic at PCPs office. Will repeat orthostatic here in the ED. Hydrated with IV fluids. Hold antihypertensive medications. Follow.  #5 hyperlipidemia Continue cholesterol medications.  #6 gastroesophageal reflux disease PPI.  #7 chronic nausea Anti-emetics as needed.  #8 history of depression/schizophrenia/ Not on any medications at this time. Continue home regimen of Ativan as needed.  #9 dementia Not on any medications at home. Outpatient followup.  #10 prophylaxis PPI for GI prophylaxis. Lovenox for DVT prophylaxis.   Code Status: Full DVT Prophylaxis: Lovenox Family Communication: Updated patient and wife at bedside. Disposition Plan: Admit to medsurg  Time spent: 30 mins  [facility] MD [facility] Pager [phone]  **Disclaimer: This note may have been dictated with voice recognition software. Similar sounding words can inadvertently be transcribed and this note may contain transcription errors which may not have been corrected upon publication of note.**

## 2014-08-15 NOTE — ED Notes (Signed)
Patient transported to CT 

## 2014-08-16 ENCOUNTER — Inpatient Hospital Stay (HOSPITAL_COMMUNITY): Payer: Medicare Other

## 2014-08-16 DIAGNOSIS — F0390 Unspecified dementia without behavioral disturbance: Secondary | ICD-10-CM

## 2014-08-16 DIAGNOSIS — I517 Cardiomegaly: Secondary | ICD-10-CM

## 2014-08-16 DIAGNOSIS — I10 Essential (primary) hypertension: Secondary | ICD-10-CM

## 2014-08-16 DIAGNOSIS — N390 Urinary tract infection, site not specified: Secondary | ICD-10-CM | POA: Diagnosis not present

## 2014-08-16 DIAGNOSIS — E876 Hypokalemia: Secondary | ICD-10-CM

## 2014-08-16 HISTORY — PX: TRANSTHORACIC ECHOCARDIOGRAM: SHX275

## 2014-08-16 LAB — URINE CULTURE
Colony Count: NO GROWTH
Culture: NO GROWTH

## 2014-08-16 LAB — CBC
HCT: 40.4 % (ref 39.0–52.0)
Hemoglobin: 14.2 g/dL (ref 13.0–17.0)
MCH: 31.8 pg (ref 26.0–34.0)
MCHC: 35.1 g/dL (ref 30.0–36.0)
MCV: 90.4 fL (ref 78.0–100.0)
Platelets: 178 10*3/uL (ref 150–400)
RBC: 4.47 MIL/uL (ref 4.22–5.81)
RDW: 12.5 % (ref 11.5–15.5)
WBC: 6.1 10*3/uL (ref 4.0–10.5)

## 2014-08-16 LAB — COMPREHENSIVE METABOLIC PANEL
ALT: 17 U/L (ref 0–53)
AST: 14 U/L (ref 0–37)
Albumin: 3.2 g/dL — ABNORMAL LOW (ref 3.5–5.2)
Alkaline Phosphatase: 85 U/L (ref 39–117)
Anion gap: 13 (ref 5–15)
BUN: 12 mg/dL (ref 6–23)
CO2: 23 mEq/L (ref 19–32)
Calcium: 8.5 mg/dL (ref 8.4–10.5)
Chloride: 107 mEq/L (ref 96–112)
Creatinine, Ser: 0.91 mg/dL (ref 0.50–1.35)
GFR calc non Af Amer: 84 mL/min — ABNORMAL LOW (ref >90)
Glucose, Bld: 131 mg/dL — ABNORMAL HIGH (ref 70–99)
POTASSIUM: 3.3 meq/L — AB (ref 3.7–5.3)
SODIUM: 143 meq/L (ref 137–147)
TOTAL PROTEIN: 5.9 g/dL — AB (ref 6.0–8.3)
Total Bilirubin: 0.3 mg/dL (ref 0.3–1.2)

## 2014-08-16 LAB — TSH: TSH: 1.43 u[IU]/mL (ref 0.350–4.500)

## 2014-08-16 LAB — TROPONIN I: Troponin I: <0.3 ng/mL (ref <0.30)

## 2014-08-16 MED ORDER — POTASSIUM CHLORIDE CRYS ER 20 MEQ PO TBCR
40.0000 meq | EXTENDED_RELEASE_TABLET | Freq: Once | ORAL | Status: AC
  Administered 2014-08-16: 40 meq via ORAL
  Filled 2014-08-16: qty 2

## 2014-08-16 MED ORDER — CETYLPYRIDINIUM CHLORIDE 0.05 % MT LIQD
7.0000 mL | Freq: Two times a day (BID) | OROMUCOSAL | Status: DC
  Administered 2014-08-16 – 2014-08-17 (×3): 7 mL via OROMUCOSAL

## 2014-08-16 NOTE — Progress Notes (Addendum)
[facility] PROGRESS NOTE  [name] TDD:[id number] DOB: 1946/01/15 DOA: 08/15/2014 PCP: Cathlean Cower, MD  Assessment/Plan  Fever likely secondary to recently diagnosed urinary tract infection which may be partially treated -  F/u BCx  -  F/u UCx -  procalcitonin negative -  Continue Zosyn day 2 -  CXR with mild vascular congestion, but otherwise clear  Generalized weakness likely secondary to acute infection.  -  Eval and tx for infection as above -  Troponins negative -  ECHO -  PT/OT pending  Orthostatic hypotension and persistently orthostatic by blood pressure despite hydration and now evidence of vascular congestion.  May have some autonomic dysfunction -  D/c IVF -  Continue to hold blood pressure medications -  TED hose -  Cortisol level with AML -  TSH wnl -  A1c 5.4, RPR previously NR, B12 277 (borderline low)  Hyperlipidemia, stable, continue cholesterol medications.  Gastroesophageal reflux disease, stable, continue PPI.  Chronic nausea, stable, continue anti-emetics Depression/schizophrenia, stable, continue home regimen of Ativan as needed.  Dementia, stable, but at risk for delirium.  Not on any medications at home. Outpatient followup.  Hypokalemia due to NS and poor PO intake.  Oral repletion  Diet:  regular Access:  PIV IVF:  off Proph:  lovenox  Code Status: full Family Communication: patient and his wife Disposition Plan: likely home tomorrow pending cultures, PT eval, ECHO   Consultants:  none  Procedures:  CXR:  Mild vascular congestion  9/12 CT head:  Stable, mild cerebral volume loss  Antibiotics:  Zosyn 9/12 >>   HPI/Subjective:  Persistent nausea, mildly improved with zofran.  Denies pain, cough, SOB.  No good BM in 2 days.    Objective: Filed Vitals:   08/16/14 0700 08/16/14 0702 08/16/14 0703 08/16/14 0815  BP: 136/81 128/83 111/74   Pulse: 60 64 74   Temp:      TempSrc:      Resp:      Height:    6\' 1"  (1.854  m)  Weight:      SpO2:        Intake/Output Summary (Last 24 hours) at 08/16/14 0848 Last data filed at 08/16/14 0500  Gross per 24 hour  Intake 2283.33 ml  Output   1450 ml  Net 833.33 ml   Filed Weights   08/15/14 1209 08/15/14 1816 08/16/14 0636  Weight: 87.346 kg (192 lb 9 oz) 82.509 kg (181 lb 14.4 oz) 90.629 kg (199 lb 12.8 oz)    Exam:   General:  WM, No acute distress, slurred speech (per wife at baseline and due to new dentures he is getting used to)  HEENT:  NCAT, MMM  Cardiovascular:  RRR, nl S1, S2 no mrg, 2+ pulses, warm extremities  Respiratory:  CTAB, no increased WOB  Abdomen:   NABS, soft, NT/ND  MSK:   Normal tone and bulk, no LEE  Neuro:  Grossly intact  Psych:  A&Ox4  Data Reviewed: Basic Metabolic Panel:  Recent Labs Lab 08/12/14 0952 08/15/14 1313 08/15/14 1847 08/16/14 0114  NA 142 140  --  143  K 3.8 3.8  --  3.3*  CL 106 102  --  107  CO2 28 24  --  23  GLUCOSE 110* 114*  --  131*  BUN 13 14  --  12  CREATININE 0.9 0.99  --  0.91  CALCIUM 9.4 9.2  --  8.5  MG  --   --  2.0  --  Liver Function Tests:  Recent Labs Lab 08/12/14 0952 08/15/14 1847 08/16/14 0114  AST 19 16 14   ALT 21 17 17   ALKPHOS 96 91 85  BILITOT 0.7 0.4 0.3  PROT 7.2 6.5 5.9*  ALBUMIN 4.3 3.7 3.2*   No results found for this basename: LIPASE, AMYLASE,  in the last 168 hours No results found for this basename: AMMONIA,  in the last 168 hours CBC:  Recent Labs Lab 08/12/14 0952 08/15/14 1313 08/16/14 0114  WBC 9.1 7.7 6.1  NEUTROABS 6.8 6.3  --   HGB 16.4 15.9 14.2  HCT 48.0 45.3 40.4  MCV 93.8 90.6 90.4  PLT 238.0 198 178   Cardiac Enzymes:  Recent Labs Lab 08/15/14 1313 08/15/14 1847 08/16/14 0114  TROPONINI <0.30 <0.30 <0.30   BNP (last 3 results) No results found for this basename: PROBNP,  in the last 8760 hours CBG: No results found for this basename: GLUCAP,  in the last 168 hours  No results found for this or any  previous visit (from the past 240 hour(s)).   Studies: Dg Chest 2 View  08/15/2014   CLINICAL DATA:  Weakness.  Nausea.  EXAM: CHEST  2 VIEW  COMPARISON:  11/01/2013.  FINDINGS: There are small bilateral pleural effusions. No airspace disease. Cardiopericardial silhouette appears within normal limits for projection. Tortuous thoracic aorta. Monitoring leads project over the chest.  IMPRESSION: Small bilateral pleural effusions.   Electronically Signed   By: Dereck Ligas M.D.   On: 08/15/2014 14:19   Ct Head Wo Contrast  08/15/2014   CLINICAL DATA:  Fall.  Nausea.  Increased urinary frequency.  EXAM: CT HEAD WITHOUT CONTRAST  TECHNIQUE: Contiguous axial images were obtained from the base of the skull through the vertex without intravenous contrast.  COMPARISON:  CT head 11/07/2010  FINDINGS: Stable mild cerebral volume loss. Ventricles are normal and stable in size. Negative for intra or extra-axial hemorrhage, mass lesion, mass effect, or acute cortically based based infarction. Skull is intact. Visualized paranasal sinuses mastoid air cells are clear.  IMPRESSION: Stable head CT.  No acute intracranial abnormality.   Electronically Signed   By: Curlene Dolphin M.D.   On: 08/15/2014 14:21   Portable Chest 1 View  08/16/2014   CLINICAL DATA:  Fever.  EXAM: PORTABLE CHEST - 1 VIEW  COMPARISON:  Chest radiograph performed 08/15/2014  FINDINGS: The lungs are well-aerated. Mild vascular congestion is noted. There is no evidence of focal opacification, pleural effusion or pneumothorax.  The cardiomediastinal silhouette is within normal limits. No acute osseous abnormalities are seen.  IMPRESSION: Mild vascular congestion noted; lungs remain grossly clear.   Electronically Signed   By: Garald Balding M.D.   On: 08/16/2014 05:37    Scheduled Meds: . antiseptic oral rinse  7 mL Mouth Rinse BID  . docusate sodium  100 mg Oral BID  . enoxaparin (LOVENOX) injection  40 mg Subcutaneous Q24H  . Influenza vac  split quadrivalent PF  0.5 mL Intramuscular Tomorrow-1000  . nicotine  14 mg Transdermal Daily  . omega-3 acid ethyl esters  3 g Oral Daily  . pantoprazole  40 mg Oral Q0600  . piperacillin-tazobactam (ZOSYN)  IV  3.375 g Intravenous 3 times per day   Continuous Infusions:   Principal Problem:   Fever Active Problems:   Dementia   HTN (hypertension)   BPH (benign prostatic hyperplasia)   Schizophrenia   Hyperlipidemia   Stroke   Depression   GERD (gastroesophageal reflux disease)  Dyspepsia   Chronic nausea   Orthostatic hypotension   UTI (lower urinary tract infection)   Weakness generalized    Time spent: 30 min    Jermani Pund, [facility] Pager [phone]. If 7PM-7AM, please contact night-coverage at [email or web address], password [facility] 08/16/2014, 8:48 AM  LOS: 1 day

## 2014-08-16 NOTE — Evaluation (Addendum)
Physical Therapy Evaluation Patient Details Name: [name] MRN: [id number] DOB: October 04, 1946 Today's Date: 08/16/2014   History of Present Illness  68 yo male admitted with fever, UTI. Hx of CVA, HTN, schizophrenia, bipolar d/o, BPH, fall  Clinical Impression  On eval, pt was Min guard assist for mobility-able to ambulate ~1000 feet without assistive device. Pt denied dizziness during session. BP 121/78 after 1st lap around unit.     Follow Up Recommendations No PT follow up;Supervision for mobility/OOB    Equipment Recommendations  None recommended by PT    Recommendations for Other Services       Precautions / Restrictions Precautions Precautions: None Restrictions Weight Bearing Restrictions: No      Mobility  Bed Mobility Overal bed mobility: Modified Independent                Transfers Overall transfer level: Needs assistance   Transfers: Sit to/from Stand Sit to Stand: Supervision            Ambulation/Gait Ambulation/Gait assistance: Min guard Ambulation Distance (Feet): 1000 Feet Assistive device: None;1 person hand held assist Gait Pattern/deviations: Step-through pattern     General Gait Details: slow gait speed. pt denied dizziness. no lob BP 121/78 after 1 lap around unit. tolerated activity well.   Stairs            Wheelchair Mobility    Modified Rankin (Stroke Patients Only)       Balance Overall balance assessment: Needs assistance         Standing balance support: No upper extremity supported;During functional activity Standing balance-Leahy Scale: Good                               Pertinent Vitals/Pain Pain Assessment: No/denies pain    Home Living Family/patient expects to be discharged to:: Private residence Living Arrangements: Spouse/significant other   Type of Home: House Home Access: Stairs to enter Entrance Stairs-Rails: None Entrance Stairs-Number of Steps: back 2 Home Layout: One  level Home Equipment: None      Prior Function Level of Independence: Independent               Hand Dominance        Extremity/Trunk Assessment   Upper Extremity Assessment: Overall WFL for tasks assessed           Lower Extremity Assessment: Generalized weakness      Cervical / Trunk Assessment: Normal  Communication   Communication: No difficulties  Cognition Arousal/Alertness: Awake/alert Behavior During Therapy: WFL for tasks assessed/performed Overall Cognitive Status: History of cognitive impairments - at baseline                      General Comments      Exercises        Assessment/Plan    PT Assessment Patient needs continued PT services  PT Diagnosis Difficulty walking   PT Problem List Decreased mobility  PT Treatment Interventions Gait training;Stair training;Functional mobility training;Therapeutic activities;Patient/family education;Therapeutic exercise   PT Goals (Current goals can be found in the Care Plan section) Acute Rehab PT Goals Patient Stated Goal: home soon PT Goal Formulation: With patient/family Time For Goal Achievement: 08/30/14 Potential to Achieve Goals: Good    Frequency Min 2X/week   Barriers to discharge        Co-evaluation               End  of Session Equipment Utilized During Treatment: Gait belt Activity Tolerance: Patient tolerated treatment well Patient left: in chair;with call bell/phone within reach;with chair alarm set;with family/visitor present           Time: 1010-1033 PT Time Calculation (min): 23 min   Charges:   PT Evaluation $Initial PT Evaluation Tier I: 1 Procedure PT Treatments $Gait Training: 23-37 mins   PT G Codes:          Weston Anna, MPT Pager: [phone]

## 2014-08-16 NOTE — Progress Notes (Signed)
Echocardiogram 2D Echocardiogram has been performed.  [name] 08/16/2014, 11:19 AM

## 2014-08-17 ENCOUNTER — Other Ambulatory Visit: Payer: Self-pay | Admitting: Internal Medicine

## 2014-08-17 DIAGNOSIS — I5032 Chronic diastolic (congestive) heart failure: Secondary | ICD-10-CM

## 2014-08-17 DIAGNOSIS — E538 Deficiency of other specified B group vitamins: Secondary | ICD-10-CM

## 2014-08-17 LAB — BASIC METABOLIC PANEL
ANION GAP: 12 (ref 5–15)
BUN: 7 mg/dL (ref 6–23)
CHLORIDE: 101 meq/L (ref 96–112)
CO2: 27 mEq/L (ref 19–32)
Calcium: 9.1 mg/dL (ref 8.4–10.5)
Creatinine, Ser: 0.9 mg/dL (ref 0.50–1.35)
GFR calc Af Amer: >90 mL/min (ref >90)
GFR calc non Af Amer: 85 mL/min — ABNORMAL LOW (ref >90)
Glucose, Bld: 107 mg/dL — ABNORMAL HIGH (ref 70–99)
Potassium: 3.4 mEq/L — ABNORMAL LOW (ref 3.7–5.3)
Sodium: 140 mEq/L (ref 137–147)

## 2014-08-17 LAB — CBC
HEMATOCRIT: 45.6 % (ref 39.0–52.0)
HEMOGLOBIN: 15.7 g/dL (ref 13.0–17.0)
MCH: 31.2 pg (ref 26.0–34.0)
MCHC: 34.4 g/dL (ref 30.0–36.0)
MCV: 90.7 fL (ref 78.0–100.0)
Platelets: 203 10*3/uL (ref 150–400)
RBC: 5.03 MIL/uL (ref 4.22–5.81)
RDW: 12.7 % (ref 11.5–15.5)
WBC: 7.8 10*3/uL (ref 4.0–10.5)

## 2014-08-17 MED ORDER — LORAZEPAM 2 MG/ML IJ SOLN
1.0000 mg | Freq: Once | INTRAMUSCULAR | Status: AC
  Administered 2014-08-17: 1 mg via INTRAVENOUS
  Filled 2014-08-17: qty 1

## 2014-08-17 MED ORDER — POTASSIUM CHLORIDE CRYS ER 20 MEQ PO TBCR
40.0000 meq | EXTENDED_RELEASE_TABLET | Freq: Once | ORAL | Status: AC
  Administered 2014-08-17: 40 meq via ORAL
  Filled 2014-08-17: qty 2

## 2014-08-17 MED ORDER — POLYETHYLENE GLYCOL 3350 17 GM/SCOOP PO POWD: 17.0000 g | Freq: Every day | ORAL | Status: DC

## 2014-08-17 MED ORDER — AMLODIPINE BESYLATE 5 MG PO TABS
5.0000 mg | ORAL_TABLET | Freq: Every day | ORAL | Status: DC
Start: 2014-08-17 — End: 2014-08-17
  Administered 2014-08-17: 5 mg via ORAL
  Filled 2014-08-17: qty 1

## 2014-08-17 MED ORDER — VITAMIN B-12 1000 MCG PO TABS
1000.0000 ug | ORAL_TABLET | Freq: Every day | ORAL | Status: DC
  Administered 2014-08-17: 1000 ug via ORAL
  Filled 2014-08-17: qty 1

## 2014-08-17 MED ORDER — CYANOCOBALAMIN 1000 MCG PO TABS: 1000.0000 ug | ORAL_TABLET | Freq: Every day | ORAL | Status: DC

## 2014-08-17 MED ORDER — TAMSULOSIN HCL 0.4 MG PO CAPS: 0.4000 mg | ORAL_CAPSULE | Freq: Every day | ORAL | Status: DC

## 2014-08-17 MED ORDER — PROMETHAZINE HCL 25 MG/ML IJ SOLN
12.5000 mg | Freq: Once | INTRAMUSCULAR | Status: AC
  Administered 2014-08-17: 12.5 mg via INTRAVENOUS
  Filled 2014-08-17: qty 1

## 2014-08-17 MED ORDER — TAMSULOSIN HCL 0.4 MG PO CAPS
0.4000 mg | ORAL_CAPSULE | Freq: Every day | ORAL | Status: DC
  Filled 2014-08-17: qty 1

## 2014-08-17 NOTE — Discharge Summary (Signed)
Physician Discharge Summary  [name]:[id number] DOB: [birth year] DOA: 08/15/2014  PCP: Cathlean Cower, MD  Admit date: 08/15/2014 Discharge date: 08/17/2014  Recommendations for Outpatient Follow-up:  1. Follow up with urology in 1-2 weeks for urinary frequency and recent UTI.  Started flomax 2. F/u with primary care doctor in 2 weeks to review diastolic heart failure, low salt diet, and daily weights.  Consider starting lasix if needed 3. PCP to please follow up pending cortisol level and blood cultures 4. Started on vitamin B12 supplement    Discharge Diagnoses:  Principal Problem:   Fever Active Problems:   Dementia   HTN (hypertension)   BPH (benign prostatic hyperplasia)   Schizophrenia   Hyperlipidemia   Stroke   Depression   GERD (gastroesophageal reflux disease)   Dyspepsia   Chronic nausea   Orthostatic hypotension   UTI (lower urinary tract infection)   Weakness generalized   Hypokalemia   Chronic diastolic CHF (congestive heart failure)   Discharge Condition: stable, improved  Diet recommendation: low sodium  Wt Readings from Last 3 Encounters:  08/17/14 88.769 kg (195 lb 11.2 oz)  08/12/14 90.719 kg (200 lb)  12/02/13 91.06 kg (200 lb 12 oz)    History of present illness:  [name] is a 68 y.o. male with history of bladder stone per urology November of 2014, BPH, hyperlipidemia, B12 deficiency, prior history of CVA without residual deficits, hypertension, schizophrenia, bipolar disorder, depression who presented to the ED with a several month history of generalized weakness, nausea, urinary urgency and frequency, dysuria, chills, nausea.  Per wife patient was noted to be very weak during 1 week prior to admission and patient fell backward into the fireplace.  He saw his PCP 3 days prior to admission with symptoms of generalized weakness, urinary urgency or frequency and was diagnosed with a urinary tract infection and started on Keflex.  He was also  noted to be orthostatic. Patient's wife states that patient was compliant with his antibiotics however he was not improving so she brought him to the emergency room.   Hospital Course:   Fever, Tmax 100.58F. Likely secondary to recently diagnosed urinary tract infection which may be partially treated.  His repeat urine culture was negative.  Blood cultures are so far NGTD.  He was started on empiric zosyn which was continued while awaiting cultures.  No evidence of pneumonia on CXR, but he did have small bilateral pleural effusions.   Urinary frequency, no active UTI so question whether he may have enlarged prostate/urinary retention.  His PVR was negligible.  He was started on flomax.  He will need to follow up with urology within 1-2 weeks.    Chronic diastolic heart failure with vascular congestion on CXR and grade 1 DD on ECHO, preserved EF.  He and his wife were counseled on daily weights, low sodium diet.  He does not appear otherwise volume overloaded on exam and given his only fair oral intake the last few days, i feel that starting home lasix may cause dehydration more than help at this time.  Will defer to PCP in 1-2 weeks and family to call if he develops swelling, SOB, or weight gain in the meantime.    Generalized weakness likely secondary to acute infection, probably urinary tract infection.  His troponins were negative.  I doubt that his mild heart failure contributed to his weakness.  He was evaluated by physical therapy who recommended no follow up, just supervision for mobility.  Orthostatic hypotension and persistently orthostatic despite hydration.  I suspect that he may have some autonomic dysfunction which was exacerbated by underlying infection.  A1c 5.4, RPR previously NR, B12 277 which is borderline low in a patient with known B12 deficiency.  Started daily oral B12 1000 units.  Cortisol level is pending.  TED hose were prescribed.  His symptoms improved during admission.     Hypertension, blood pressure was initially low so his norvasc was held, but BP has been elevated over last 24 hours so norvasc resumed. Hyperlipidemia, stable, continue cholesterol medications.  Gastroesophageal reflux disease, stable, continue PPI.  Chronic nausea, stable, continue anti-emetics  Depression/schizophrenia, stable, continue home regimen of Ativan as needed.  Dementia, stable, but at risk for delirium. Not on any medications at home. Outpatient followup.  Hypokalemia due to NS and poor PO intake. Oral repletion   Consultants:  none Procedures:  CXR: Mild vascular congestion  9/12 CT head: Stable, mild cerebral volume loss Antibiotics:  Zosyn 9/12 >>   Discharge Exam: Filed Vitals:   08/17/14 0602  BP: 155/99  Pulse: 69  Temp: 97.9 F (36.6 C)  Resp: 16   Filed Vitals:   08/16/14 0815 08/16/14 1353 08/16/14 2050 08/17/14 0602  BP:  159/88 163/93 155/99  Pulse:  72 69 69  Temp:  98.3 F (36.8 C) 98.6 F (37 C) 97.9 F (36.6 C)  TempSrc:  Oral Oral Oral  Resp:  18 16 16   Height: 6\' 1"  (1.854 m)     Weight:    88.769 kg (195 lb 11.2 oz)  SpO2:  94% 95% 97%    General: WM, No acute distress, baseline slurred speech  HEENT: NCAT, MMM  Cardiovascular: RRR, nl S1, S2 no mrg, 2+ pulses, warm extremities  Respiratory: CTAB, no increased WOB  Abdomen: NABS, soft, NT/ND  MSK: Normal tone and bulk, no LEE Neuro: Grossly intact Psych: A&Ox4   Discharge Instructions      Discharge Instructions   (HEART FAILURE PATIENTS) Call MD:  Anytime you have any of the following symptoms: 1) 3 pound weight gain in 24 hours or 5 pounds in 1 week 2) shortness of breath, with or without a dry hacking cough 3) swelling in the hands, feet or stomach 4) if you have to sleep on extra pillows at night in order to breathe.    Complete by:  As directed      Call MD for:  difficulty breathing, headache or visual disturbances    Complete by:  As directed      Call MD for:   extreme fatigue    Complete by:  As directed      Call MD for:  hives    Complete by:  As directed      Call MD for:  persistant dizziness or light-headedness    Complete by:  As directed      Call MD for:  persistant nausea and vomiting    Complete by:  As directed      Call MD for:  severe uncontrolled pain    Complete by:  As directed      Call MD for:  temperature >100.4    Complete by:  As directed      Diet - low sodium heart healthy    Complete by:  As directed      Discharge instructions    Complete by:  As directed   You were hospitalized with fevers, weakness, urinary frequency.  You may have  had a urinary tract infection that had been partly treated with keflex.  Your urine culture grew no bacteria, but you continued potent antibiotics for three more days just in case.  You did not have a blood stream infection or pneumonia.  For your urinary frequency, please follow up with Dr. Karsten Ro ASAP and use flomax in the meantime to see if this helps.  You have very mild congestive heart failure.  Please weigh yourself daily and record the numbers in a journal.  If you gain more than 3-lbs in one day or 5-lbs in one week, call Dr. Judi Cong.  If you notice swelling in your ankles or feet or shortness of breath, also call Dr. Judi Cong.   Please try to limit your salt to no more than 2000mg  of sodium per day.  Wear the TED hose (white stockings) during the day and stand up gradually to prevent lightheadedness.  Take vitamin B12 tabs to boost your levels.     Increase activity slowly    Complete by:  As directed             Medication List         ALPRAZolam 0.5 MG tablet  Commonly known as:  XANAX  TAKE 1 TABLET BY MOUTH TWICE DAILY AS NEEDED     amLODipine 5 MG tablet  Commonly known as:  NORVASC  Take 5 mg by mouth daily.     cephALEXin 500 MG capsule  Commonly known as:  KEFLEX  Take 1 capsule (500 mg total) by mouth 4 (four) times daily.     cyanocobalamin 1000 MCG tablet  Take 1  tablet (1,000 mcg total) by mouth daily.     Flax Seed Oil 1000 MG Caps  Take 1,000 mg by mouth 2 (two) times daily.     omega-3 acid ethyl esters 1 G capsule  Commonly known as:  LOVAZA  Take 3 g by mouth daily.     omeprazole 20 MG capsule  Commonly known as:  PRILOSEC  Take 1 capsule (20 mg total) by mouth daily.     ondansetron 4 MG tablet  Commonly known as:  ZOFRAN  Take 1 tablet (4 mg total) by mouth every 8 (eight) hours as needed for nausea or vomiting.     polyethylene glycol powder powder  Commonly known as:  MIRALAX  Take 17 g by mouth daily.     tamsulosin 0.4 MG Caps capsule  Commonly known as:  FLOMAX  Take 1 capsule (0.4 mg total) by mouth daily after supper.       Follow-up Information   Follow up with Cathlean Cower, MD. Schedule an appointment as soon as possible for a visit in 2 weeks.   Specialties:  Internal Medicine, Radiology   Contact information:   [address] [place] [address] [phone]       Follow up with Claybon Jabs, MD. Schedule an appointment as soon as possible for a visit in 2 weeks.   Specialty:  Urology   Contact information:   [place] [address] [phone]        The results of significant diagnostics from this hospitalization (including imaging, microbiology, ancillary and laboratory) are listed below for reference.    Significant Diagnostic Studies: Dg Chest 2 View  08/15/2014   CLINICAL DATA:  Weakness.  Nausea.  EXAM: CHEST  2 VIEW  COMPARISON:  11/01/2013.  FINDINGS: There are small bilateral pleural effusions. No airspace disease.  Cardiopericardial silhouette appears within normal limits for projection. Tortuous thoracic aorta. Monitoring leads project over the chest.  IMPRESSION: Small bilateral pleural effusions.   Electronically Signed   By: Dereck Ligas M.D.   On: 08/15/2014 14:19   Ct Head Wo Contrast  08/15/2014   CLINICAL DATA:  Fall.  Nausea.  Increased urinary frequency.   EXAM: CT HEAD WITHOUT CONTRAST  TECHNIQUE: Contiguous axial images were obtained from the base of the skull through the vertex without intravenous contrast.  COMPARISON:  CT head 11/07/2010  FINDINGS: Stable mild cerebral volume loss. Ventricles are normal and stable in size. Negative for intra or extra-axial hemorrhage, mass lesion, mass effect, or acute cortically based based infarction. Skull is intact. Visualized paranasal sinuses mastoid air cells are clear.  IMPRESSION: Stable head CT.  No acute intracranial abnormality.   Electronically Signed   By: Curlene Dolphin M.D.   On: 08/15/2014 14:21   Portable Chest 1 View  08/16/2014   CLINICAL DATA:  Fever.  EXAM: PORTABLE CHEST - 1 VIEW  COMPARISON:  Chest radiograph performed 08/15/2014  FINDINGS: The lungs are well-aerated. Mild vascular congestion is noted. There is no evidence of focal opacification, pleural effusion or pneumothorax.  The cardiomediastinal silhouette is within normal limits. No acute osseous abnormalities are seen.  IMPRESSION: Mild vascular congestion noted; lungs remain grossly clear.   Electronically Signed   By: Garald Balding M.D.   On: 08/16/2014 05:37    Microbiology: Recent Results (from the past 240 hour(s))  URINE CULTURE     Status: None   Collection Time    08/15/14  4:26 PM      Result Value Ref Range Status   Specimen Description URINE, RANDOM   Final   Special Requests NONE   Final   Culture  Setup Time     Final   Value: 08/15/2014 21:35     Performed at [place]     Final   Value: NO GROWTH     Performed at [facility]   Culture     Final   Value: NO GROWTH     Performed at [facility]   Report Status 08/16/2014 FINAL   Final  CULTURE, BLOOD (ROUTINE X 2)     Status: None   Collection Time    08/15/14  4:35 PM      Result Value Ref Range Status   Specimen Description BLOOD LEFT ARM   Final   Special Requests BOTTLES DRAWN AEROBIC AND ANAEROBIC 4CC EACH    Final   Culture  Setup Time     Final   Value: 08/15/2014 20:49     Performed at [facility]   Culture     Final   Value:        BLOOD CULTURE RECEIVED NO GROWTH TO DATE CULTURE WILL BE HELD FOR 5 DAYS BEFORE ISSUING A FINAL NEGATIVE REPORT     Performed at [facility]   Report Status PENDING   Incomplete  CULTURE, BLOOD (ROUTINE X 2)     Status: None   Collection Time    08/15/14  4:35 PM      Result Value Ref Range Status   Specimen Description BLOOD RIGHT HAND   Final   Special Requests BOTTLES DRAWN AEROBIC AND ANAEROBIC [facility] EACH   Final   Culture  Setup Time     Final   Value: 08/15/2014 20:49  Performed at [facility]     Final   Value:        BLOOD CULTURE RECEIVED NO GROWTH TO DATE CULTURE WILL BE HELD FOR 5 DAYS BEFORE ISSUING A FINAL NEGATIVE REPORT     Performed at [facility]   Report Status PENDING   Incomplete     Labs: Basic Metabolic Panel:  Recent Labs Lab 08/12/14 0952 08/15/14 1313 08/15/14 1847 08/16/14 0114 08/17/14 0418  NA 142 140  --  143 140  K 3.8 3.8  --  3.3* 3.4*  CL 106 102  --  107 101  CO2 28 24  --  23 27  GLUCOSE 110* 114*  --  131* 107*  BUN 13 14  --  12 7  CREATININE 0.9 0.99  --  0.91 0.90  CALCIUM 9.4 9.2  --  8.5 9.1  MG  --   --  2.0  --   --    Liver Function Tests:  Recent Labs Lab 08/12/14 0952 08/15/14 1847 08/16/14 0114  AST 19 16 14   ALT 21 17 17   ALKPHOS 96 91 85  BILITOT 0.7 0.4 0.3  PROT 7.2 6.5 5.9*  ALBUMIN 4.3 3.7 3.2*   No results found for this basename: LIPASE, AMYLASE,  in the last 168 hours No results found for this basename: AMMONIA,  in the last 168 hours CBC:  Recent Labs Lab 08/12/14 0952 08/15/14 1313 08/16/14 0114 08/17/14 0418  WBC 9.1 7.7 6.1 7.8  NEUTROABS 6.8 6.3  --   --   HGB 16.4 15.9 14.2 15.7  HCT 48.0 45.3 40.4 45.6  MCV 93.8 90.6 90.4 90.7  PLT 238.0 198 178 203   Cardiac Enzymes:  Recent Labs Lab 08/15/14 1313  08/15/14 1847 08/16/14 0114  TROPONINI <0.30 <0.30 <0.30   BNP: BNP (last 3 results) No results found for this basename: PROBNP,  in the last 8760 hours CBG: No results found for this basename: GLUCAP,  in the last 168 hours  Time coordinating discharge: 45 minutes including counseling about mild diastolic heart failure  Signed:  Ailish Prospero  [facility] 08/17/2014, 12:44 PM

## 2014-08-17 NOTE — Progress Notes (Signed)
Bladder scan performed , = 0 residual.

## 2014-08-17 NOTE — Progress Notes (Addendum)
[facility] PROGRESS NOTE  [name] ENI:[id number] DOB: 06/04/1946 DOA: 08/15/2014 PCP: Cathlean Cower, MD  Assessment/Plan  Fever likely secondary to recently diagnosed urinary tract infection which may be partially treated, resolved. -  BCx NGTD -  UCx neg -  procalcitonin negative -  Continue Zosyn day 3 -  CXR with mild vascular congestion, but otherwise clear  Urinary frequency, no active UTI so question whether he may have enlarged prostate/urinary retention -  Check PVR and place foley PRN > 379mL retained urine -  Start flomax  Chronic diastolic heart failure with vascular congestion on CXR and grade 1 DD on ECHO, preserved EF -  Daily weights at home and follow up with primary care doctor   Generalized weakness likely secondary to acute infection.  -  Eval and tx for infection as above -  Troponins negative -  ECHO:  Preserved EF 60-65%, mild LVH, grade 1 DD, mild aortic valve calcifications without stenosis -  PT/OT:  No follow up necessary  Orthostatic hypotension and persistently orthostatic by blood pressure despite hydration and now evidence of vascular congestion.  May have some autonomic dysfunction -  TED hose  -  Cortisol level pending -  TSH wnl -  A1c 5.4, RPR previously NR, B12 277 (borderline low)  Hypertension, blood pressure improved yesterday, resume norvasc Hyperlipidemia, stable, continue cholesterol medications.  Gastroesophageal reflux disease, stable, continue PPI.  Chronic nausea, stable, continue anti-emetics Depression/schizophrenia, stable, continue home regimen of Ativan as needed.  Dementia, stable, but at risk for delirium.  Not on any medications at home. Outpatient followup.  Hypokalemia due to NS and poor PO intake.  Oral repletion  Diet:  regular Access:  PIV IVF:  off Proph:  lovenox  Code Status: full Family Communication: patient and his wife Disposition Plan:  Home later today pending results of ECHO and  PVR  Consultants:  none  Procedures:  CXR:  Mild vascular congestion  9/12 CT head:  Stable, mild cerebral volume loss  Antibiotics:  Zosyn 9/12 >>   HPI/Subjective:  Persistent nausea, mildly improved with zofran.  Denies pain, cough, SOB.  No good BM in 2 days.    Objective: Filed Vitals:   08/16/14 0815 08/16/14 1353 08/16/14 2050 08/17/14 0602  BP:  159/88 163/93 155/99  Pulse:  72 69 69  Temp:  98.3 F (36.8 C) 98.6 F (37 C) 97.9 F (36.6 C)  TempSrc:  Oral Oral Oral  Resp:  18 16 16   Height: 6\' 1"  (1.854 m)     Weight:    88.769 kg (195 lb 11.2 oz)  SpO2:  94% 95% 97%    Intake/Output Summary (Last 24 hours) at 08/17/14 0959 Last data filed at 08/17/14 0603  Gross per 24 hour  Intake    390 ml  Output   2200 ml  Net  -1810 ml   Filed Weights   08/15/14 1816 08/16/14 0636 08/17/14 0602  Weight: 82.509 kg (181 lb 14.4 oz) 90.629 kg (199 lb 12.8 oz) 88.769 kg (195 lb 11.2 oz)    Exam:   General:  WM, No acute distress, baseline slurred speech   HEENT:  NCAT, MMM  Cardiovascular:  RRR, nl S1, S2 no mrg, 2+ pulses, warm extremities  Respiratory:  CTAB, no increased WOB  Abdomen:   NABS, soft, NT/ND  MSK:   Normal tone and bulk, no LEE  Neuro:  Grossly intact  Psych:  A&Ox4  Data Reviewed: Basic Metabolic Panel:  Recent  Labs Lab 08/12/14 0952 08/15/14 1313 08/15/14 1847 08/16/14 0114 08/17/14 0418  NA 142 140  --  143 140  K 3.8 3.8  --  3.3* 3.4*  CL 106 102  --  107 101  CO2 28 24  --  23 27  GLUCOSE 110* 114*  --  131* 107*  BUN 13 14  --  12 7  CREATININE 0.9 0.99  --  0.91 0.90  CALCIUM 9.4 9.2  --  8.5 9.1  MG  --   --  2.0  --   --    Liver Function Tests:  Recent Labs Lab 08/12/14 0952 08/15/14 1847 08/16/14 0114  AST 19 16 14   ALT 21 17 17   ALKPHOS 96 91 85  BILITOT 0.7 0.4 0.3  PROT 7.2 6.5 5.9*  ALBUMIN 4.3 3.7 3.2*   No results found for this basename: LIPASE, AMYLASE,  in the last 168 hours No results  found for this basename: AMMONIA,  in the last 168 hours CBC:  Recent Labs Lab 08/12/14 0952 08/15/14 1313 08/16/14 0114 08/17/14 0418  WBC 9.1 7.7 6.1 7.8  NEUTROABS 6.8 6.3  --   --   HGB 16.4 15.9 14.2 15.7  HCT 48.0 45.3 40.4 45.6  MCV 93.8 90.6 90.4 90.7  PLT 238.0 198 178 203   Cardiac Enzymes:  Recent Labs Lab 08/15/14 1313 08/15/14 1847 08/16/14 0114  TROPONINI <0.30 <0.30 <0.30   BNP (last 3 results) No results found for this basename: PROBNP,  in the last 8760 hours CBG: No results found for this basename: GLUCAP,  in the last 168 hours  Recent Results (from the past 240 hour(s))  URINE CULTURE     Status: None   Collection Time    08/15/14  4:26 PM      Result Value Ref Range Status   Specimen Description URINE, RANDOM   Final   Special Requests NONE   Final   Culture  Setup Time     Final   Value: 08/15/2014 21:35     Performed at [place]     Final   Value: NO GROWTH     Performed at [facility]   Culture     Final   Value: NO GROWTH     Performed at [facility]   Report Status 08/16/2014 FINAL   Final  CULTURE, BLOOD (ROUTINE X 2)     Status: None   Collection Time    08/15/14  4:35 PM      Result Value Ref Range Status   Specimen Description BLOOD LEFT ARM   Final   Special Requests BOTTLES DRAWN AEROBIC AND ANAEROBIC 4CC EACH   Final   Culture  Setup Time     Final   Value: 08/15/2014 20:49     Performed at [facility]   Culture     Final   Value:        BLOOD CULTURE RECEIVED NO GROWTH TO DATE CULTURE WILL BE HELD FOR 5 DAYS BEFORE ISSUING A FINAL NEGATIVE REPORT     Performed at [facility]   Report Status PENDING   Incomplete  CULTURE, BLOOD (ROUTINE X 2)     Status: None   Collection Time    08/15/14  4:35 PM      Result Value Ref Range Status   Specimen Description BLOOD RIGHT HAND   Final   Special Requests BOTTLES DRAWN AEROBIC  AND ANAEROBIC 5CC EACH   Final    Culture  Setup Time     Final   Value: 08/15/2014 20:49     Performed at [facility]   Culture     Final   Value:        BLOOD CULTURE RECEIVED NO GROWTH TO DATE CULTURE WILL BE HELD FOR 5 DAYS BEFORE ISSUING A FINAL NEGATIVE REPORT     Performed at [facility]   Report Status PENDING   Incomplete     Studies: Dg Chest 2 View  08/15/2014   CLINICAL DATA:  Weakness.  Nausea.  EXAM: CHEST  2 VIEW  COMPARISON:  11/01/2013.  FINDINGS: There are small bilateral pleural effusions. No airspace disease. Cardiopericardial silhouette appears within normal limits for projection. Tortuous thoracic aorta. Monitoring leads project over the chest.  IMPRESSION: Small bilateral pleural effusions.   Electronically Signed   By: Dereck Ligas M.D.   On: 08/15/2014 14:19   Ct Head Wo Contrast  08/15/2014   CLINICAL DATA:  Fall.  Nausea.  Increased urinary frequency.  EXAM: CT HEAD WITHOUT CONTRAST  TECHNIQUE: Contiguous axial images were obtained from the base of the skull through the vertex without intravenous contrast.  COMPARISON:  CT head 11/07/2010  FINDINGS: Stable mild cerebral volume loss. Ventricles are normal and stable in size. Negative for intra or extra-axial hemorrhage, mass lesion, mass effect, or acute cortically based based infarction. Skull is intact. Visualized paranasal sinuses mastoid air cells are clear.  IMPRESSION: Stable head CT.  No acute intracranial abnormality.   Electronically Signed   By: Curlene Dolphin M.D.   On: 08/15/2014 14:21   Portable Chest 1 View  08/16/2014   CLINICAL DATA:  Fever.  EXAM: PORTABLE CHEST - 1 VIEW  COMPARISON:  Chest radiograph performed 08/15/2014  FINDINGS: The lungs are well-aerated. Mild vascular congestion is noted. There is no evidence of focal opacification, pleural effusion or pneumothorax.  The cardiomediastinal silhouette is within normal limits. No acute osseous abnormalities are seen.  IMPRESSION: Mild vascular congestion noted; lungs  remain grossly clear.   Electronically Signed   By: Garald Balding M.D.   On: 08/16/2014 05:37    Scheduled Meds: . antiseptic oral rinse  7 mL Mouth Rinse BID  . docusate sodium  100 mg Oral BID  . enoxaparin (LOVENOX) injection  40 mg Subcutaneous Q24H  . nicotine  14 mg Transdermal Daily  . omega-3 acid ethyl esters  3 g Oral Daily  . pantoprazole  40 mg Oral Q0600  . piperacillin-tazobactam (ZOSYN)  IV  3.375 g Intravenous 3 times per day   Continuous Infusions:   Principal Problem:   Fever Active Problems:   Dementia   HTN (hypertension)   BPH (benign prostatic hyperplasia)   Schizophrenia   Hyperlipidemia   Stroke   Depression   GERD (gastroesophageal reflux disease)   Dyspepsia   Chronic nausea   Orthostatic hypotension   UTI (lower urinary tract infection)   Weakness generalized   Hypokalemia    Time spent: 30 min    Ashleynicole Mcclees, [facility] Pager [phone]. If 7PM-7AM, please contact night-coverage at [email or web address], password [facility] 08/17/2014, 9:59 AM  LOS: 2 days

## 2014-08-18 LAB — CORTISOL-AM, BLOOD: CORTISOL - AM: 12.9 ug/dL (ref 4.3–22.4)

## 2014-08-18 NOTE — Telephone Encounter (Signed)
Done hardcopy to [facility]  

## 2014-08-19 ENCOUNTER — Telehealth: Payer: Self-pay | Admitting: *Deleted

## 2014-08-19 NOTE — Telephone Encounter (Signed)
Transition Care Management Follow-up Telephone Call  How have you been since you were released from the hospital? Wife stated he is ok still having a lot of nausea. Advise pt/wife to take the generic zofran that was rx as well to help until he see md tomorrow   Do you understand why you were in the hospital? YES, they both understood why he was admitted   Do you understand the discharge instrcutions? YES, they both understood discharge summary we went over again  Items Reviewed:  Medications reviewed: {YES, wife stated they didn't tell him to start back on the cephalexin so he is not taking. Advise to continue to hold until appt. Wife stated Hosp also added him bck amlodipine 5mg . He is able to take all meds as rx  Allergies reviewed: YES, no changes on allergies  Dietary changes reviewed: YES, wife stated hospital recommended a low sodium diet & pt is following. Wife stated he not eating too much but she is giving him Boost to drink      Referrals reviewed: YES, no referral was made   Functional Questionnaire:   Activities of Daily Living (ADLs):   wife states he  independent in the following: feeding  States they require assistance with the following: bathing and hygiene wife helps him shower & getting dress. She states she doesn't want him to fall   Any transportation issues/concerns?: NO   Any patient concerns? NO   Confirmed importance and date/time of follow-up visits scheduled: YES, Hosp f/u was made at discharge verified with wife, and advise to keep appt   Confirmed with patient if condition begins to worsen call PCP or go to the ER.  Patient was given the Call-a-Nurse line [phone]: YES, gave wife call-a-nurse

## 2014-08-19 NOTE — Telephone Encounter (Signed)
Hardcopy has been faxed by [facility] [place]

## 2014-08-20 ENCOUNTER — Encounter: Payer: Self-pay | Admitting: Internal Medicine

## 2014-08-20 ENCOUNTER — Ambulatory Visit (INDEPENDENT_AMBULATORY_CARE_PROVIDER_SITE_OTHER): Payer: Medicare Other | Admitting: Internal Medicine

## 2014-08-20 VITALS — BP 152/86 | HR 77 | Temp 98.2°F | Wt 203.8 lb

## 2014-08-20 DIAGNOSIS — I5032 Chronic diastolic (congestive) heart failure: Secondary | ICD-10-CM

## 2014-08-20 DIAGNOSIS — I10 Essential (primary) hypertension: Secondary | ICD-10-CM

## 2014-08-20 DIAGNOSIS — R110 Nausea: Secondary | ICD-10-CM

## 2014-08-20 DIAGNOSIS — N390 Urinary tract infection, site not specified: Secondary | ICD-10-CM

## 2014-08-20 NOTE — Progress Notes (Signed)
Pre visit review using our clinic review tool, if applicable. No additional management support is needed unless otherwise documented below in the visit note. 

## 2014-08-20 NOTE — Progress Notes (Signed)
Subjective:    Patient ID: [name], male    DOB: 04-Jun-1946, 68 y.o.   MRN: [id number]  HPI  Here to f/u post hospn, with wife, with UTI/fever, blood cultures/cortisol neg for acute.  Denies urinary symptoms such as dysuria, frequency, urgency, flank pain, hematuria or n/v, fever, chills. Saw urology/Dr Karsten Ro, cont flomax, also started toviaz, PSA mild elev but ? Due to infection. Pt denies chest pain, increased sob or doe, wheezing, orthopnea, PND, increased LE swelling, palpitations, dizziness or syncope. Just started BP med yesterday.  Wt stable 199-200 since d/c. Denies worsening reflux, abd pain, dysphagia, n/v, bowel change or blood, but does have occas recurring nausea, chronic constipation.  Did quit tobacco so far since his hospn. Dementia overall stable symptomatically, and not assoc with behavioral changes such as hallucinations, paranoia, or agitation. Past Medical History  Diagnosis Date  . Hyperlipidemia   . Vitamin B12 deficiency   . Occasional tremors     leg tremors  . BPH (benign prostatic hypertrophy) with urinary obstruction   . History of CVA (cerebrovascular accident) without residual deficits     2011  . Other degenerative diseases of the basal ganglia     S/P CVA 2011--  CHRONIC LEFT BASAL GANGLIA LACUNAR INFARCT  PER CT  . History of nonmelanoma skin cancer     EXCISION OF NOSE  . Hypertension   . Bladder stone   . Schizophrenia     HX PSYCHIATRIC ADMISSION'S  AND ECT (SHOCK) TX  . Bipolar affective disorder   . ED (erectile dysfunction) of organic origin   . Dementia   . Depression   . Constipation   . Generalized weakness     LOWER EXTREMITIES  . Chronic anxiety   . Urinary bladder stone 08/12/2014   Past Surgical History  Procedure Laterality Date  . Transanal excision anal and rectal polyps  11-02-2000  . Transthoracic echocardiogram  02-03-2012    GRADE I DIASTOLIC DYSFUNCTION/  EF 60-65%  . Cardiovascular stress test  06-18-2013  DR Jenkins [place]    LOW RISK NO EXERCISE LEXISCAN STUDY/  NO ISCHEMIA, CANNOT RULE OUT BASAL INFERIOR INFARCTION BUT POSSIBLE ARTIFACT GIVEN NORMAL WALL MOTION/  EF 73%  . Excision tumor upper left thigh  2012  . Transurethral incision of prostate N/A 10/03/2013    Procedure: TRANSURETHRAL INCISION OF THE PROSTATE (TUIP);  Surgeon: Claybon Jabs, MD;  Location: [facility];  Service: Urology;  Laterality: N/A;  . Cystoscopy with litholapaxy N/A 10/03/2013    Procedure: CYSTOSCOPY WITH LITHOLAPAXY;  Surgeon: Claybon Jabs, MD;  Location: [facility];  Service: Urology;  Laterality: N/A;  . Holmium laser application N/A 98/92/1194    Procedure: HOLMIUM LASER APPLICATION;  Surgeon: Claybon Jabs, MD;  Location: [facility];  Service: Urology;  Laterality: N/A;    reports that he has been smoking Cigarettes.  He has a 24 pack-year smoking history. He has never used smokeless tobacco. He reports that he does not drink alcohol or use illicit drugs. family history includes Dementia in his father and mother. There is no history of Colon cancer, Colon polyps, Rectal cancer, or Stomach cancer. No Known Allergies Current Outpatient Prescriptions on File Prior to Visit  Medication Sig Dispense Refill  . ALPRAZolam (XANAX) 0.5 MG tablet TAKE 1 TABLET BY MOUTH TWICE DAILY AS NEEDED  60 tablet  1  . amLODipine (NORVASC) 5 MG tablet Take 5 mg by mouth daily.       Marland Kitchen  cephALEXin (KEFLEX) 500 MG capsule Take 1 capsule (500 mg total) by mouth 4 (four) times daily.  40 capsule  0  . Flaxseed, Linseed, (FLAX SEED OIL) 1000 MG CAPS Take 1,000 mg by mouth 2 (two) times daily.      Marland Kitchen omega-3 acid ethyl esters (LOVAZA) 1 G capsule Take 3 g by mouth daily.      Marland Kitchen omeprazole (PRILOSEC) 20 MG capsule Take 1 capsule (20 mg total) by mouth daily.  30 capsule  11  . ondansetron (ZOFRAN) 4 MG tablet Take 1 tablet (4 mg total) by mouth every 8 (eight) hours as needed for nausea or vomiting.   90 tablet  1  . polyethylene glycol powder (MIRALAX) powder Take 17 g by mouth daily.  527 g  0  . tamsulosin (FLOMAX) 0.4 MG CAPS capsule Take 1 capsule (0.4 mg total) by mouth daily after supper.  30 capsule  0  . vitamin B-12 1000 MCG tablet Take 1 tablet (1,000 mcg total) by mouth daily.  30 tablet  0   No current facility-administered medications on file prior to visit.     Review of Systems  Constitutional: Negative for unusual diaphoresis or other sweats  HENT: Negative for ringing in ear Eyes: Negative for double vision or worsening visual disturbance.  Respiratory: Negative for choking and stridor.   Gastrointestinal: Negative for vomiting or other signifcant bowel change Genitourinary: Negative for hematuria or decreased urine volume.  Musculoskeletal: Negative for other MSK pain or swelling Skin: Negative for color change and worsening wound.  Neurological: Negative for tremors and numbness other than noted  Psychiatric/Behavioral: Negative for decreased concentration or agitation other than above       Objective:   Physical Exam BP 152/86  Pulse 77  Temp(Src) 98.2 F (36.8 C) (Oral)  Wt 203 lb 12 oz (92.42 kg)  SpO2 96% VS noted,  Constitutional: Pt appears well-developed, well-nourished.  HENT: Head: NCAT.  Right Ear: External ear normal.  Left Ear: External ear normal.  Eyes: . Pupils are equal, round, and reactive to light. Conjunctivae and EOM are normal Neck: Normal range of motion. Neck supple.  Cardiovascular: Normal rate and regular rhythm.   Pulmonary/Chest: Effort normal and breath sounds normal.  Abd:  Soft, NT, ND, + BS, no flank tender Neurological: Pt is alert. + confused , motor grossly intact Skin: Skin is warm. No rash; + trace pedal edema bilat Psychiatric: Pt behavior is No agitation.     Assessment & Plan:   BP Readings from Last 3 Encounters:  08/20/14 152/86  08/17/14 155/99  08/12/14 132/92

## 2014-08-20 NOTE — Patient Instructions (Signed)
OK to take Miralax daily 17 gm  Please continue all other medications as before, and refills have been done if requested.  Please have the pharmacy call with any other refills you may need.  Please continue your efforts at being more active, low cholesterol diet, and weight control.  You are otherwise up to date with prevention measures today.  Please keep your appointments with your specialists as you may have planned  Please call if the swelling is increased, and the weight is up more than 3-4 lbs.

## 2014-08-21 LAB — CULTURE, BLOOD (ROUTINE X 2)
Culture: NO GROWTH
Culture: NO GROWTH

## 2014-08-23 NOTE — Assessment & Plan Note (Signed)
Mild elev today, just re-started med, cont same tx for now, f/u BP at home and next visit

## 2014-08-23 NOTE — Assessment & Plan Note (Signed)
stable overall by history and exam, recent data reviewed with pt and wife, and pt to continue medical treatment as before,  to f/u any worsening symptoms or concerns, check daily wts, consider lasix prn

## 2014-08-23 NOTE — Assessment & Plan Note (Signed)
Blood cx neg, to finish cephalexin,  to f/u any worsening symptoms or concerns, cont fu with urology as well

## 2014-08-23 NOTE — Assessment & Plan Note (Signed)
With constipation - for miralax daily ,  to f/u any worsening symptoms or concerns

## 2014-08-24 ENCOUNTER — Other Ambulatory Visit: Payer: Self-pay | Admitting: Internal Medicine

## 2014-09-15 DIAGNOSIS — R110 Nausea: Secondary | ICD-10-CM

## 2014-09-15 DIAGNOSIS — I5032 Chronic diastolic (congestive) heart failure: Secondary | ICD-10-CM

## 2014-09-15 DIAGNOSIS — I10 Essential (primary) hypertension: Secondary | ICD-10-CM

## 2014-09-15 DIAGNOSIS — N390 Urinary tract infection, site not specified: Secondary | ICD-10-CM

## 2014-09-17 ENCOUNTER — Encounter: Payer: Self-pay | Admitting: *Deleted

## 2014-09-21 ENCOUNTER — Telehealth: Payer: Self-pay | Admitting: Neurology

## 2014-09-21 NOTE — Telephone Encounter (Signed)
Pt called to cancel his NP appt for tomorrow 09/22/14. Pt will not Have transportation. He will call later to r/s. Dr. Jenny Reichmann, referring provider was notified.

## 2014-09-22 ENCOUNTER — Ambulatory Visit: Payer: Medicare Other | Admitting: Neurology

## 2014-10-18 ENCOUNTER — Other Ambulatory Visit: Payer: Self-pay | Admitting: Internal Medicine

## 2014-10-20 NOTE — Telephone Encounter (Signed)
Faxed hardcopy for Alprazolam to [place]

## 2014-10-20 NOTE — Telephone Encounter (Signed)
Done hardcopy to [facility]  

## 2014-11-23 ENCOUNTER — Telehealth: Payer: Self-pay | Admitting: *Deleted

## 2014-11-23 ENCOUNTER — Emergency Department (HOSPITAL_COMMUNITY): Payer: Medicare Other

## 2014-11-23 ENCOUNTER — Inpatient Hospital Stay (HOSPITAL_COMMUNITY): Payer: Medicare Other

## 2014-11-23 ENCOUNTER — Inpatient Hospital Stay (HOSPITAL_COMMUNITY)
Admission: EM | Admit: 2014-11-23 | Discharge: 2014-11-25 | DRG: 057 | Disposition: A | Discharge status: Home / Self Care | Payer: Medicare Other | Attending: Internal Medicine | Admitting: Internal Medicine

## 2014-11-23 ENCOUNTER — Encounter (HOSPITAL_COMMUNITY): Payer: Self-pay | Admitting: Emergency Medicine

## 2014-11-23 DIAGNOSIS — E538 Deficiency of other specified B group vitamins: Secondary | ICD-10-CM | POA: Diagnosis present

## 2014-11-23 DIAGNOSIS — R531 Weakness: Secondary | ICD-10-CM

## 2014-11-23 DIAGNOSIS — R269 Unspecified abnormalities of gait and mobility: Secondary | ICD-10-CM | POA: Diagnosis present

## 2014-11-23 DIAGNOSIS — N400 Enlarged prostate without lower urinary tract symptoms: Secondary | ICD-10-CM | POA: Diagnosis present

## 2014-11-23 DIAGNOSIS — E785 Hyperlipidemia, unspecified: Secondary | ICD-10-CM | POA: Diagnosis present

## 2014-11-23 DIAGNOSIS — R270 Ataxia, unspecified: Secondary | ICD-10-CM | POA: Diagnosis present

## 2014-11-23 DIAGNOSIS — I10 Essential (primary) hypertension: Secondary | ICD-10-CM | POA: Diagnosis present

## 2014-11-23 DIAGNOSIS — K5900 Constipation, unspecified: Secondary | ICD-10-CM | POA: Diagnosis present

## 2014-11-23 DIAGNOSIS — Z8673 Personal history of transient ischemic attack (TIA), and cerebral infarction without residual deficits: Secondary | ICD-10-CM | POA: Diagnosis not present

## 2014-11-23 DIAGNOSIS — R262 Difficulty in walking, not elsewhere classified: Secondary | ICD-10-CM

## 2014-11-23 DIAGNOSIS — F319 Bipolar disorder, unspecified: Secondary | ICD-10-CM | POA: Diagnosis present

## 2014-11-23 DIAGNOSIS — G20 Parkinson's disease: Principal | ICD-10-CM | POA: Diagnosis present

## 2014-11-23 DIAGNOSIS — E559 Vitamin D deficiency, unspecified: Secondary | ICD-10-CM | POA: Diagnosis present

## 2014-11-23 DIAGNOSIS — F209 Schizophrenia, unspecified: Secondary | ICD-10-CM | POA: Diagnosis present

## 2014-11-23 DIAGNOSIS — Z818 Family history of other mental and behavioral disorders: Secondary | ICD-10-CM | POA: Diagnosis not present

## 2014-11-23 DIAGNOSIS — F0390 Unspecified dementia without behavioral disturbance: Secondary | ICD-10-CM | POA: Diagnosis present

## 2014-11-23 DIAGNOSIS — Z720 Tobacco use: Secondary | ICD-10-CM | POA: Diagnosis not present

## 2014-11-23 DIAGNOSIS — F418 Other specified anxiety disorders: Secondary | ICD-10-CM | POA: Diagnosis present

## 2014-11-23 DIAGNOSIS — F329 Major depressive disorder, single episode, unspecified: Secondary | ICD-10-CM | POA: Diagnosis present

## 2014-11-23 DIAGNOSIS — R4182 Altered mental status, unspecified: Secondary | ICD-10-CM

## 2014-11-23 LAB — CBC WITH DIFFERENTIAL/PLATELET
BASOS PCT: 0 % (ref 0–1)
Basophils Absolute: 0 10*3/uL (ref 0.0–0.1)
Eosinophils Absolute: 0.1 10*3/uL (ref 0.0–0.7)
Eosinophils Relative: 2 % (ref 0–5)
HCT: 43.8 % (ref 39.0–52.0)
Hemoglobin: 15.4 g/dL (ref 13.0–17.0)
Lymphocytes Relative: 22 % (ref 12–46)
Lymphs Abs: 1.6 10*3/uL (ref 0.7–4.0)
MCH: 32.2 pg (ref 26.0–34.0)
MCHC: 35.2 g/dL (ref 30.0–36.0)
MCV: 91.6 fL (ref 78.0–100.0)
MONOS PCT: 8 % (ref 3–12)
Monocytes Absolute: 0.6 10*3/uL (ref 0.1–1.0)
NEUTROS ABS: 5.1 10*3/uL (ref 1.7–7.7)
Neutrophils Relative %: 68 % (ref 43–77)
Platelets: 228 10*3/uL (ref 150–400)
RBC: 4.78 MIL/uL (ref 4.22–5.81)
RDW: 13 % (ref 11.5–15.5)
WBC: 7.5 10*3/uL (ref 4.0–10.5)

## 2014-11-23 LAB — COMPREHENSIVE METABOLIC PANEL
ALBUMIN: 4.1 g/dL (ref 3.5–5.2)
ALT: 22 U/L (ref 0–53)
ANION GAP: 15 (ref 5–15)
AST: 20 U/L (ref 0–37)
Alkaline Phosphatase: 99 U/L (ref 39–117)
BILIRUBIN TOTAL: 0.5 mg/dL (ref 0.3–1.2)
BUN: 11 mg/dL (ref 6–23)
CHLORIDE: 103 meq/L (ref 96–112)
CO2: 23 meq/L (ref 19–32)
CREATININE: 0.75 mg/dL (ref 0.50–1.35)
Calcium: 9.2 mg/dL (ref 8.4–10.5)
GFR calc Af Amer: >90 mL/min (ref >90)
Glucose, Bld: 101 mg/dL — ABNORMAL HIGH (ref 70–99)
Potassium: 4 mEq/L (ref 3.7–5.3)
Sodium: 141 mEq/L (ref 137–147)
Total Protein: 6.9 g/dL (ref 6.0–8.3)

## 2014-11-23 LAB — URINALYSIS, ROUTINE W REFLEX MICROSCOPIC
Bilirubin Urine: NEGATIVE
Glucose, UA: NEGATIVE mg/dL
Hgb urine dipstick: NEGATIVE
KETONES UR: NEGATIVE mg/dL
NITRITE: NEGATIVE
PROTEIN: NEGATIVE mg/dL
Specific Gravity, Urine: 1.004 — ABNORMAL LOW (ref 1.005–1.030)
Urobilinogen, UA: 0.2 mg/dL (ref 0.0–1.0)
pH: 7 (ref 5.0–8.0)

## 2014-11-23 LAB — CK: Total CK: 103 U/L (ref 7–232)

## 2014-11-23 LAB — HEPATIC FUNCTION PANEL
ALT: 19 U/L (ref 0–53)
AST: 17 U/L (ref 0–37)
Albumin: 3.6 g/dL (ref 3.5–5.2)
Alkaline Phosphatase: 86 U/L (ref 39–117)
Bilirubin, Direct: <0.2 mg/dL (ref 0.0–0.3)
TOTAL PROTEIN: 6.3 g/dL (ref 6.0–8.3)
Total Bilirubin: 0.7 mg/dL (ref 0.3–1.2)

## 2014-11-23 LAB — URINE MICROSCOPIC-ADD ON

## 2014-11-23 LAB — MAGNESIUM: Magnesium: 2 mg/dL (ref 1.5–2.5)

## 2014-11-23 LAB — I-STAT CG4 LACTIC ACID, ED: LACTIC ACID, VENOUS: 0.96 mmol/L (ref 0.5–2.2)

## 2014-11-23 LAB — TROPONIN I: Troponin I: <0.3 ng/mL (ref <0.30)

## 2014-11-23 MED ORDER — OXYCODONE HCL 5 MG PO TABS
5.0000 mg | ORAL_TABLET | ORAL | Status: DC | PRN
  Administered 2014-11-23 – 2014-11-25 (×7): 5 mg via ORAL
  Filled 2014-11-23 (×7): qty 1

## 2014-11-23 MED ORDER — SODIUM CHLORIDE 0.9 % IV SOLN
INTRAVENOUS | Status: DC
  Administered 2014-11-23 – 2014-11-25 (×5): via INTRAVENOUS

## 2014-11-23 MED ORDER — ONDANSETRON HCL 4 MG/2ML IJ SOLN
4.0000 mg | Freq: Once | INTRAMUSCULAR | Status: AC
  Administered 2014-11-23: 4 mg via INTRAVENOUS
  Filled 2014-11-23: qty 2

## 2014-11-23 MED ORDER — AMLODIPINE BESYLATE 10 MG PO TABS
10.0000 mg | ORAL_TABLET | Freq: Every day | ORAL | Status: DC
  Administered 2014-11-23 – 2014-11-25 (×3): 10 mg via ORAL
  Filled 2014-11-23 (×3): qty 1

## 2014-11-23 MED ORDER — ONDANSETRON HCL 4 MG PO TABS
4.0000 mg | ORAL_TABLET | Freq: Four times a day (QID) | ORAL | Status: DC | PRN
  Administered 2014-11-24 – 2014-11-25 (×3): 4 mg via ORAL
  Filled 2014-11-23 (×3): qty 1

## 2014-11-23 MED ORDER — LORAZEPAM 2 MG/ML IJ SOLN
0.5000 mg | Freq: Once | INTRAMUSCULAR | Status: AC
  Administered 2014-11-23: 0.5 mg via INTRAVENOUS
  Filled 2014-11-23: qty 1

## 2014-11-23 MED ORDER — ACETAMINOPHEN 325 MG PO TABS
650.0000 mg | ORAL_TABLET | Freq: Four times a day (QID) | ORAL | Status: DC | PRN
  Administered 2014-11-24: 650 mg via ORAL
  Filled 2014-11-23: qty 2

## 2014-11-23 MED ORDER — ENSURE COMPLETE PO LIQD
237.0000 mL | Freq: Two times a day (BID) | ORAL | Status: DC
  Administered 2014-11-24: 237 mL via ORAL

## 2014-11-23 MED ORDER — LEVALBUTEROL HCL 0.63 MG/3ML IN NEBU: 0.6300 mg | INHALATION_SOLUTION | Freq: Four times a day (QID) | RESPIRATORY_TRACT | Status: DC | PRN

## 2014-11-23 MED ORDER — FESOTERODINE FUMARATE ER 8 MG PO TB24
8.0000 mg | ORAL_TABLET | Freq: Every day | ORAL | Status: DC
  Administered 2014-11-23 – 2014-11-25 (×3): 8 mg via ORAL
  Filled 2014-11-23 (×3): qty 1

## 2014-11-23 MED ORDER — ONDANSETRON HCL 4 MG/2ML IJ SOLN
4.0000 mg | Freq: Four times a day (QID) | INTRAMUSCULAR | Status: DC | PRN
  Administered 2014-11-23 – 2014-11-24 (×2): 4 mg via INTRAVENOUS
  Filled 2014-11-23 (×2): qty 2

## 2014-11-23 MED ORDER — ONDANSETRON HCL 4 MG PO TABS: 4.0000 mg | ORAL_TABLET | Freq: Three times a day (TID) | ORAL | Status: DC | PRN

## 2014-11-23 MED ORDER — OMEGA-3-ACID ETHYL ESTERS 1 G PO CAPS
3.0000 g | ORAL_CAPSULE | Freq: Every day | ORAL | Status: DC
  Administered 2014-11-23 – 2014-11-25 (×3): 3 g via ORAL
  Filled 2014-11-23 (×3): qty 3

## 2014-11-23 MED ORDER — ENOXAPARIN SODIUM 40 MG/0.4ML ~~LOC~~ SOLN
40.0000 mg | SUBCUTANEOUS | Status: DC
  Filled 2014-11-23: qty 0.4

## 2014-11-23 MED ORDER — VITAMIN B-12 1000 MCG PO TABS
1000.0000 ug | ORAL_TABLET | Freq: Every day | ORAL | Status: DC
  Administered 2014-11-24 – 2014-11-25 (×2): 1000 ug via ORAL
  Filled 2014-11-23 (×3): qty 1

## 2014-11-23 MED ORDER — ACETAMINOPHEN 650 MG RE SUPP: 650.0000 mg | Freq: Four times a day (QID) | RECTAL | Status: DC | PRN

## 2014-11-23 MED ORDER — DOCUSATE SODIUM 100 MG PO CAPS
100.0000 mg | ORAL_CAPSULE | Freq: Two times a day (BID) | ORAL | Status: DC
  Administered 2014-11-23 – 2014-11-25 (×4): 100 mg via ORAL
  Filled 2014-11-23 (×5): qty 1

## 2014-11-23 MED ORDER — SODIUM CHLORIDE 0.9 % IJ SOLN
3.0000 mL | Freq: Two times a day (BID) | INTRAMUSCULAR | Status: DC
  Administered 2014-11-24: 3 mL via INTRAVENOUS

## 2014-11-23 MED ORDER — ALPRAZOLAM 0.5 MG PO TABS
0.5000 mg | ORAL_TABLET | Freq: Three times a day (TID) | ORAL | Status: DC
  Administered 2014-11-23 – 2014-11-25 (×5): 0.5 mg via ORAL
  Filled 2014-11-23 (×5): qty 1

## 2014-11-23 MED ORDER — MORPHINE SULFATE 2 MG/ML IJ SOLN
2.0000 mg | Freq: Once | INTRAMUSCULAR | Status: AC
  Administered 2014-11-23: 2 mg via INTRAVENOUS
  Filled 2014-11-23: qty 1

## 2014-11-23 MED ORDER — POLYETHYLENE GLYCOL 3350 17 GM/SCOOP PO POWD
17.0000 g | Freq: Every day | ORAL | Status: DC
  Administered 2014-11-23: 17 g via ORAL
  Filled 2014-11-23 (×2): qty 255

## 2014-11-23 MED ORDER — PANTOPRAZOLE SODIUM 40 MG PO TBEC
40.0000 mg | DELAYED_RELEASE_TABLET | Freq: Every day | ORAL | Status: DC
  Administered 2014-11-24 – 2014-11-25 (×2): 40 mg via ORAL
  Filled 2014-11-23 (×2): qty 1

## 2014-11-23 NOTE — ED Notes (Signed)
Pt from MRI to 4th floor, bedside report provided to [place], [place]

## 2014-11-23 NOTE — ED Notes (Signed)
Pt to MRI

## 2014-11-23 NOTE — Telephone Encounter (Signed)
[place] [facility] - Client TELEPHONE ADVICE RECORD [facility] Patient Name: [name] Gender: Male DOB: November 01, 1946 Age: 68 Y 47 M 17 D Return Phone Number: [phone] (Primary) Address: [address] City/State/Zip: [place] [address] Client [facility] - Client Client Site [place] - Day Physician Cathlean Cower Contact Type Call Call Type Triage / Terrell Name tansey Trew Relationship To Patient Spouse Return Phone Number [phone] (Primary) Chief Complaint CONFUSION - new onset Initial Comment Caller States husband tried to get up off sofa and rolled down onto the floor. she got him up in bed and he said he thinks he is about to die. concerned as to what she needs to do. PreDisposition Call Doctor Nurse Assessment Nurse: Rock Nephew, RN, Juliann Pulse Date/Time (Eastern Time): 11/23/2014 10:19:29 AM Confirm and document reason for call. If symptomatic, describe symptoms. ---Caller states her husband tried to get up off sofa and rolled down onto the floor ( she states he fell due to weakness ) . She was able to help him into bed but her husband is anxious and told her that he thought he was about to die. He has nausea but denies chest pain or difficulty with his breathing. Caller states he does not look right, " he is glassy eyed ". Has the patient traveled out of the country within the last 30 days? ---Not Applicable Does the patient require triage? ---Yes Related visit to physician within the last 2 weeks? ---No Does the PT have any chronic conditions? (i.e. diabetes, asthma, etc.) ---Yes List chronic conditions. ---HTN , Bi Polar, CHF Guidelines Guideline Title Affirmed Question Affirmed Notes Nurse Date/Time (Eastern Time) Weakness (Generalized) and Fatigue Shock suspected (e.g., cold/pale/clammy skin, too weak to stand, low BP, rapid pulse) Wells, RN, Juliann Pulse 11/23/2014 10:22:07 AM Disp.  Time Eilene Ghazi Time) Disposition Final User 11/23/2014 10:16:43 AM Send to Urgent Queue Donato Heinz 11/23/2014 10:28:07 AM Send To RN Personal Rock Nephew, RN, Juliann Pulse 11/23/2014 10:32:15 AM 911 Follow Up Call Attempted Rock Nephew, RN, Aldean Baker NOTE: All timestamps contained within this report are represented as [place] Standard Time. CONFIDENTIALTY NOTICE: This fax transmission is intended only for the addressee. It contains information that is legally privileged, confidential or otherwise protected from use or disclosure. If you are not the intended recipient, you are strictly prohibited from reviewing, disclosing, copying using or disseminating any of this information or taking any action in reliance on or regarding this information. If you have received this fax in error, please notify us immediately by telephone so that we can arrange for its return to [place]. Phone: [phone], Toll-Free: [phone], Fax: [phone] Page: 2 of 2 Call Id: [id number] [place]. Time Eilene Ghazi Time) Disposition Final User Reason: caller confirms EMS is on the way 11/23/2014 10:27:45 AM Call EMS 911 Now Yes Rock Nephew, RN, Gara Kroner Understands: Yes Disagree/Comply: Comply Care Advice Given Per Guideline CALL EMS 911 NOW: Immediate medical attention is needed. You need to hang up and call 911 (or an ambulance). Psychologist, forensic Discretion: I'll call you back in a few minutes to be sure you were able to reach them.) FIRST AID: Lie down with the feet elevated (Reason: counteract shock) CARE ADVICE given per Weakness and Fatigue (Adult) guideline. After Care Instructions Given Call Event Type User Date / Time Description

## 2014-11-23 NOTE — ED Notes (Signed)
PER EMS - pt from home with c/o generalized weakness x1 mo, worsening x1 week, also c/o frequent urination with hx UTI.  Hx dementia, at baseline.

## 2014-11-23 NOTE — ED Notes (Signed)
Pt c/o nausea and a headache, EDPA made aware.

## 2014-11-23 NOTE — H&P (Addendum)
[facility] History and Physical  [name] IPJ:[id number] DOB: 1946/11/27 DOA: 11/23/2014  Referring physician:  PCP: Cathlean Cower, MD   Chief Complaint: Headache  HPI:  68 year old male with a history of CVA, hypertension, schizophrenia, dementia presented to the ER with change in behavior since last week. The patient has complained of insomnia, headache, nausea, decreased appetite and difficulty walking. The patient's wife provides most of the history and she states that patient was able to walk independently up until last week. Last week the patient started having difficulty with initiation of gait, and instead of moving forward the patient start taking short steps backward, no recent history of falls, patient appears flushed in his face but denies any history of fever. He was treated for a urinary tract infection 2 months ago. No history of vertigo, dizziness, chest pain, shortness of breath. No recent change in the patient's medications. No gross focal neurologic deficits  In the ED the workup including an MRI is essentially negative.  Review of Systems: negative for the following  Constitutional: Denies fever, chills, diaphoresis, appetite change and fatigue.  HEENT: Denies photophobia, eye pain, redness, hearing loss, ear pain, congestion, sore throat, rhinorrhea, sneezing, mouth sores, trouble swallowing, neck pain, neck stiffness and tinnitus.  Respiratory: Denies SOB, DOE, cough, chest tightness, and wheezing.  Cardiovascular: Denies chest pain, palpitations and leg swelling.  Gastrointestinal: Denies nausea, vomiting, abdominal pain, diarrhea, constipation, blood in stool and abdominal distention.  Genitourinary: Denies dysuria, urgency, frequency, hematuria, flank pain and difficulty urinating.  Musculoskeletal: Denies myalgias, back pain, joint swelling, arthralgias and gait problem.  Skin: Denies pallor, rash and wound.  Neurological: Denies dizziness, seizures,  syncope, weakness, light-headedness, numbness and headaches. Difficulty with ambulation, headache Hematological: Denies adenopathy. Easy bruising, personal or family bleeding history  Psychiatric/Behavioral: Denies suicidal ideation, mood changes, confusion, nervousness, sleep disturbance and agitation       Past Medical History  Diagnosis Date  . Hyperlipidemia   . Vitamin B12 deficiency   . Occasional tremors     leg tremors  . BPH (benign prostatic hypertrophy) with urinary obstruction   . History of CVA (cerebrovascular accident) without residual deficits     2011  . Other degenerative diseases of the basal ganglia     S/P CVA 2011--  CHRONIC LEFT BASAL GANGLIA LACUNAR INFARCT  PER CT  . History of nonmelanoma skin cancer     EXCISION OF NOSE  . Hypertension   . Bladder stone   . Schizophrenia     HX PSYCHIATRIC ADMISSION'S  AND ECT (SHOCK) TX  . Bipolar affective disorder   . ED (erectile dysfunction) of organic origin   . Dementia   . Depression   . Constipation   . Generalized weakness     LOWER EXTREMITIES  . Chronic anxiety   . Urinary bladder stone 08/12/2014     Past Surgical History  Procedure Laterality Date  . Transanal excision anal and rectal polyps  11-02-2000  . Transthoracic echocardiogram  02-03-2012    GRADE I DIASTOLIC DYSFUNCTION/  EF 60-65%  . Cardiovascular stress test  06-18-2013  DR Jenkins [place]    LOW RISK NO EXERCISE LEXISCAN STUDY/  NO ISCHEMIA, CANNOT RULE OUT BASAL INFERIOR INFARCTION BUT POSSIBLE ARTIFACT GIVEN NORMAL WALL MOTION/  EF 73%  . Excision tumor upper left thigh  2012  . Transurethral incision of prostate N/A 10/03/2013    Procedure: TRANSURETHRAL INCISION OF THE PROSTATE (TUIP);  Surgeon: Claybon Jabs, MD;  Location: [facility];  Service: Urology;  Laterality: N/A;  . Cystoscopy with litholapaxy N/A 10/03/2013    Procedure: CYSTOSCOPY WITH LITHOLAPAXY;  Surgeon: Claybon Jabs, MD;  [facility];  Service: Urology;  Laterality: N/A;  . Holmium laser application N/A 94/49/6759    Procedure: HOLMIUM LASER APPLICATION;  Surgeon: Claybon Jabs, MD;  Location: [facility];  Service: Urology;  Laterality: N/A;      Social History:  reports that he has been smoking Cigarettes.  He has a 24 pack-year smoking history. He has never used smokeless tobacco. He reports that he does not drink alcohol or use illicit drugs.    No Known Allergies  Family History  Problem Relation Age of Onset  . Dementia Father   . Dementia Mother   . Colon cancer Neg Hx   . Colon polyps Neg Hx   . Rectal cancer Neg Hx   . Stomach cancer Neg Hx      Prior to Admission medications   Medication Sig Start Date End Date Taking? Authorizing Provider  ALPRAZolam Duanne Moron) 0.5 MG tablet Take 0.5 mg by mouth every 8 (eight) hours.   Yes Historical Provider, MD  amLODipine (NORVASC) 5 MG tablet Take 5 mg by mouth daily.  08/06/13  Yes Historical Provider, MD  bisacodyl (DULCOLAX) 5 MG EC tablet Take 15 mg by mouth daily as needed (For constipation.).    Yes Historical Provider, MD  Flaxseed, Linseed, (FLAXSEED OIL PO) Take 2 capsules by mouth daily.   Yes Historical Provider, MD  Omega-3 Fatty Acids (FISH OIL) 1200 MG CAPS Take 2,400 mg by mouth daily.   Yes Historical Provider, MD  omeprazole (PRILOSEC) 20 MG capsule Take 1 capsule (20 mg total) by mouth daily. Patient taking differently: Take 20 mg by mouth daily as needed (For heartburn or acid reflux.).  08/12/14  Yes Biagio Borg, MD  ondansetron (ZOFRAN) 4 MG tablet Take 1 tablet (4 mg total) by mouth every 8 (eight) hours as needed for nausea or vomiting. 08/12/14  Yes Biagio Borg, MD  tamsulosin (FLOMAX) 0.4 MG CAPS capsule Take 1 capsule (0.4 mg total) by mouth daily after supper. Patient taking differently: Take 0.4 mg by mouth daily as needed (Takes when he can't empty bladder).  08/17/14  Yes Janece Canterbury, MD  vitamin B-12 1000  MCG tablet Take 1 tablet (1,000 mcg total) by mouth daily. 08/17/14  Yes Janece Canterbury, MD  ALPRAZolam Duanne Moron) 0.5 MG tablet TAKE 1 TABLET BY MOUTH TWICE DAILY AS NEEDED 10/20/14   Biagio Borg, MD  atorvastatin (LIPITOR) 10 MG tablet  08/12/14   Historical Provider, MD  cephALEXin (KEFLEX) 500 MG capsule Take 1 capsule (500 mg total) by mouth 4 (four) times daily. Patient not taking: Reported on 11/23/2014 08/12/14   Biagio Borg, MD  fesoterodine (TOVIAZ) 8 MG TB24 tablet Take 8 mg by mouth daily.    Historical Provider, MD  Flaxseed, Linseed, (FLAX SEED OIL) 1000 MG CAPS Take 1,000 mg by mouth 2 (two) times daily.    Historical Provider, MD  omega-3 acid ethyl esters (LOVAZA) 1 G capsule Take 3 g by mouth daily.    Historical Provider, MD  ondansetron (ZOFRAN) 4 MG tablet TAKE 1 TABLET BY MOUTH EVERY 8 HOURS AS NEEDED FOR NAUSEA AND VOMITING 08/24/14   Biagio Borg, MD  polyethylene glycol powder (MIRALAX) powder Take 17 g by mouth daily. 08/17/14   Janece Canterbury, MD  Physical Exam: Filed Vitals:   11/23/14 1400 11/23/14 1401 11/23/14 1430 11/23/14 1600  BP: 161/95 161/95 167/94 151/98  Pulse: 78 81 88   Temp:      Resp: 14 18 17 15   Height:      Weight:      SpO2: 96% 97% 97%      Constitutional: Vital signs reviewed. Patient is a well-developed and well-nourished in no acute distress and cooperative with exam. Alert and oriented to self, face is flushed.  Head: Normocephalic and atraumatic  Ear: TM normal bilaterally  Mouth: no erythema or exudates, MMM  Eyes: PERRL, EOMI, conjunctivae normal, No scleral icterus.  Neck: Supple, Trachea midline normal ROM, No JVD, mass, thyromegaly, or carotid bruit present.  Cardiovascular: RRR, S1 normal, S2 normal, no MRG, pulses symmetric and intact bilaterally  Pulmonary/Chest: CTAB, no wheezes, rales, or rhonchi  Abdominal: Soft. Non-tender, non-distended, bowel sounds are normal, no masses, organomegaly, or guarding present.  GU: no CVA  tenderness Musculoskeletal: No joint deformities, erythema, or stiffness, ROM full and no nontender Ext: no edema and no cyanosis, pulses palpable bilaterally (DP and PT)  Hematology: no cervical, inginal, or axillary adenopathy.  Neurological: Neurological: He is alert and oriented to person, place, and time.  Strength is 4 out of 5-5 extremities, distal sensation is grossly intact, pupils are equal round reactive to light, uvula is midline. Finger to nose and heel to shin with no abnormalities. Patient cannot ambulate without assistance, he takes short shuffling steps. He does have a tendency to fall backwards and needs assistance in order not to fall. Skin: Warm, dry and intact. No rash, cyanosis, or clubbing.  Psychiatric: Normal mood and affect. speech and behavior is normal. Judgment and thought content normal. Cognition and memory are normal.       Labs on Admission:    Basic Metabolic Panel:  Recent Labs Lab 11/23/14 1142  NA 141  K 4.0  CL 103  CO2 23  GLUCOSE 101*  BUN 11  CREATININE 0.75  CALCIUM 9.2   Liver Function Tests:  Recent Labs Lab 11/23/14 1142  AST 20  ALT 22  ALKPHOS 99  BILITOT 0.5  PROT 6.9  ALBUMIN 4.1   No results for input(s): LIPASE, AMYLASE in the last 168 hours. No results for input(s): AMMONIA in the last 168 hours. CBC:  Recent Labs Lab 11/23/14 1142  WBC 7.5  NEUTROABS 5.1  HGB 15.4  HCT 43.8  MCV 91.6  PLT 228   Cardiac Enzymes: No results for input(s): CKTOTAL, CKMB, CKMBINDEX, TROPONINI in the last 168 hours.  BNP (last 3 results) No results for input(s): PROBNP in the last 8760 hours.    CBG: No results for input(s): GLUCAP in the last 168 hours.  Radiological Exams on Admission: Dg Chest 2 View  11/23/2014   CLINICAL DATA:  Weakness, worsening over the last few days.  EXAM: CHEST  2 VIEW  COMPARISON:  08/16/2014  FINDINGS: Mild tortuosity and atherosclerosis of the thoracic aorta. Thoracic spondylosis.  The  lungs appear clear.  No pleural effusion.  Heart size normal.  IMPRESSION: 1. Chronic atherosclerosis and tortuosity of the thoracic aorta. No acute findings.   Electronically Signed   By: Sherryl Barters M.D.   On: 11/23/2014 11:46   Ct Head Wo Contrast  11/23/2014   CLINICAL DATA:  Unsteady gait, difficulty walking  EXAM: CT HEAD WITHOUT CONTRAST  TECHNIQUE: Contiguous axial images were obtained from the base of the skull through the  vertex without intravenous contrast.  COMPARISON:  Head CT 08/15/2014  FINDINGS: No acute intracranial hemorrhage. No focal mass lesion. No CT evidence of acute infarction. No midline shift or mass effect. No hydrocephalus. Basilar cisterns are patent.  There is generalized cortical atrophy similar to prior. Mild periventricular white matter hypodensities.  Paranasal sinuses and  mastoid air cells are clear.  IMPRESSION: 1. No acute intracranial findings. 2. Atrophy and white matter microvascular disease.   Electronically Signed   By: Suzy Bouchard M.D.   On: 11/23/2014 14:02   Mr Brain Wo Contrast  11/23/2014   CLINICAL DATA:  68 year old hypertensive male with hyperlipidemia presenting with confusion and unsteady gait with difficulty walking. Initial encounter.  EXAM: MRI HEAD WITHOUT CONTRAST  TECHNIQUE: Multiplanar, multiecho pulse sequences of the brain and surrounding structures were obtained without intravenous contrast.  COMPARISON:  11/23/2014 CT.  08/07/2012 MR.  FINDINGS: Exam is motion degraded.  No acute infarct.  No intracranial hemorrhage.  Mild periventricular white matter type changes suggestive of result of small vessel disease.  Global atrophy without hydrocephalus.  No intracranial mass lesion noted on this unenhanced exam.  Major intracranial vascular structures are patent and ectatic.  Partially empty sella felt to be an incidental finding.  Cervical medullary junction, pineal region and orbital structures are unremarkable.  Nonspecific right parietal  small scalp lesion.  IMPRESSION: Exam is motion degraded.  No acute infarct.  Mild periventricular white matter type changes suggestive of result of small vessel disease.  Global atrophy without hydrocephalus.  No intracranial mass lesion noted on this unenhanced exam.  Nonspecific right parietal small scalp lesion.   Electronically Signed   By: Chauncey Cruel M.D.   On: 11/23/2014 17:23    EKG: Independently reviewed.   Assessment/Plan Active Problems:   Ataxia   Altered mental status secondary to worsening dementia,  No findings of a CVA or hydrocephalus Update patient's home medication list Neurology suspects Parkinson disease and the patient will be started on Sinemet TSH Vitamin B-12 level PT/OT/evaluation    bipolar disorder, dementia, depression Review of patient's medications Will check total CPK,   Hypertension Uncontrolled Continue Norvasc   BPH Continue Flomax  History of CVA Patient has discontinued aspirin because of petechiae according to the patient's wife   Code Status:   full Family Communication: bedside Disposition Plan: admit   Time spent: 70 mins   [facility] Pager [phone]  If 7PM-7AM, please contact night-coverage [email or web address] Password [facility] 11/23/2014, 5:34 PM

## 2014-11-23 NOTE — ED Notes (Signed)
Initial Contact - pt awake, alert, resting on stretcher, reports generalized weakness x1 mo, but worsening "can't sleep", pt also reports urinary frequency with hx recent UTI.  Skin PWD.  MAEI.  Speaking full/clear sentences.  Pt denies CP/SOB or other complaints.  Changed to hospital gown, placed to cardiac/02 monitor.  NAD.

## 2014-11-23 NOTE — ED Notes (Signed)
Pt to CXR.

## 2014-11-23 NOTE — Progress Notes (Addendum)
Choice offered  HOME HEALTH AGENCIES SERVING [place]   Agencies that are Medicare-Certified and are affiliated with [place]  [facility]  Telephone Number Address  [facility]   The [facility] has ownership interest in this company; however, you are under no obligation to use this agency. [phone] or  [phone] [address] [place], [place] [address] [email or web address]   Agencies that are Medicare-Certified and are not affiliated with [place]                                                                                  Home Health Agency Telephone Number Address  [facility] [phone] Fax [phone] [address], [address] [place], [place]  [address] [email or web address]  [facility] [phone] Fax [phone] [address]   [address] [place], [place]  [address] [email or web address]  [facility] [phone] Fax [phone] [address] [place], [place] [address] [email or web address]  [facility] [phone] Fax [phone] [address], [address] [place], [place]  [address] [email or web address]  [facility] [phone] Fax [phone] [address], [address] [place], [place] [address] [email or web address]  [facility] [phone] [address] [place], [place] [address] [email or web address]  [facility] [phone]  [address] [place], [place] [address] [email or web address]  [facility] [phone] or [phone] Fax number [phone] [address], [address] [place], [place]  [address] [email or web address]  [facility] [phone] Fax [phone] [address] [place],   [address]  [facility]   [phone] Fax [phone] [address] [place],  [address] [email or web address]   

## 2014-11-23 NOTE — ED Provider Notes (Signed)
CSN: [id number]     Arrival date & time 11/23/14  1115 History   First MD Initiated Contact with Patient 11/23/14 1121     Chief Complaint  Patient presents with  . Weakness    x1 month, worsening  . Urinary Frequency     (Consider location/radiation/quality/duration/timing/severity/associated sxs/prior Treatment) HPI   [name] is a 68 y.o. male with past medical history significant for CVA, hypertension, schizophrenia, dementia accompanied by his wife who supplies most of the history complaining of generalized weakness, difficulty sleeping, decreased appetite, urinary frequency x2 months. Wife reports difficulty walking with patient having to be supported by his wife in order to ambulate onset 2 days ago. Patient lives at home with his wife, she is the only caregiver. She states that he is taking short steps and has to be supported so he doesn't fall backwards. Denies fever, chills, vomiting, chest pain, shortness of breath.   Past Medical History  Diagnosis Date  . Hyperlipidemia   . Vitamin B12 deficiency   . Occasional tremors     leg tremors  . BPH (benign prostatic hypertrophy) with urinary obstruction   . History of CVA (cerebrovascular accident) without residual deficits     2011  . Other degenerative diseases of the basal ganglia     S/P CVA 2011--  CHRONIC LEFT BASAL GANGLIA LACUNAR INFARCT  PER CT  . History of nonmelanoma skin cancer     EXCISION OF NOSE  . Hypertension   . Bladder stone   . Schizophrenia     HX PSYCHIATRIC ADMISSION'S  AND ECT (SHOCK) [place]  . Bipolar affective disorder   . ED (erectile dysfunction) of organic origin   . Dementia   . Depression   . Constipation   . Generalized weakness     LOWER EXTREMITIES  . Chronic anxiety   . Urinary bladder stone 08/12/2014   Past Surgical History  Procedure Laterality Date  . Transanal excision anal and rectal polyps  11-02-2000  . Transthoracic echocardiogram  02-03-2012    GRADE I DIASTOLIC  DYSFUNCTION/  EF 60-65%  . Cardiovascular stress test  06-18-2013  DR Jenkins [place]    LOW RISK NO EXERCISE LEXISCAN STUDY/  NO ISCHEMIA, CANNOT RULE OUT BASAL INFERIOR INFARCTION BUT POSSIBLE ARTIFACT GIVEN NORMAL WALL MOTION/  EF 73%  . Excision tumor upper left thigh  2012  . Transurethral incision of prostate N/A 10/03/2013    Procedure: TRANSURETHRAL INCISION OF THE PROSTATE (TUIP);  Surgeon: Claybon Jabs, MD;  Location: [facility];  Service: Urology;  Laterality: N/A;  . Cystoscopy with litholapaxy N/A 10/03/2013    Procedure: CYSTOSCOPY WITH LITHOLAPAXY;  Surgeon: Claybon Jabs, MD;  Location: [facility];  Service: Urology;  Laterality: N/A;  . Holmium laser application N/A 75/91/6384    Procedure: HOLMIUM LASER APPLICATION;  Surgeon: Claybon Jabs, MD;  Location: [facility];  Service: Urology;  Laterality: N/A;   Family History  Problem Relation Age of Onset  . Dementia Father   . Dementia Mother   . Colon cancer Neg Hx   . Colon polyps Neg Hx   . Rectal cancer Neg Hx   . Stomach cancer Neg Hx    History  Substance Use Topics  . Smoking status: Current Every Day Smoker -- 0.50 packs/day for 48 years    Types: Cigarettes  . Smokeless tobacco: Never Used  . Alcohol Use: No    Review of Systems  10  systems reviewed and found to be negative, except as noted in the HPI.   Allergies  Review of patient's allergies indicates no known allergies.  Home Medications   Prior to Admission medications   Medication Sig Start Date End Date Taking? Authorizing Provider  ALPRAZolam Duanne Moron) 0.5 MG tablet Take 0.5 mg by mouth every 8 (eight) hours.   Yes Historical Provider, MD  amLODipine (NORVASC) 5 MG tablet Take 5 mg by mouth daily.  08/06/13  Yes Historical Provider, MD  bisacodyl (DULCOLAX) 5 MG EC tablet Take 15 mg by mouth daily as needed (For constipation.).    Yes Historical Provider, MD  Flaxseed, Linseed, (FLAXSEED OIL PO)  Take 2 capsules by mouth daily.   Yes Historical Provider, MD  Omega-3 Fatty Acids (FISH OIL) 1200 MG CAPS Take 2,400 mg by mouth daily.   Yes Historical Provider, MD  omeprazole (PRILOSEC) 20 MG capsule Take 1 capsule (20 mg total) by mouth daily. Patient taking differently: Take 20 mg by mouth daily as needed (For heartburn or acid reflux.).  08/12/14  Yes Biagio Borg, MD  ondansetron (ZOFRAN) 4 MG tablet Take 1 tablet (4 mg total) by mouth every 8 (eight) hours as needed for nausea or vomiting. 08/12/14  Yes Biagio Borg, MD  tamsulosin (FLOMAX) 0.4 MG CAPS capsule Take 1 capsule (0.4 mg total) by mouth daily after supper. Patient taking differently: Take 0.4 mg by mouth daily as needed (Takes when he can't empty bladder).  08/17/14  Yes Janece Canterbury, MD  vitamin B-12 1000 MCG tablet Take 1 tablet (1,000 mcg total) by mouth daily. 08/17/14  Yes Janece Canterbury, MD  ALPRAZolam Duanne Moron) 0.5 MG tablet TAKE 1 TABLET BY MOUTH TWICE DAILY AS NEEDED 10/20/14   Biagio Borg, MD  atorvastatin (LIPITOR) 10 MG tablet  08/12/14   Historical Provider, MD  cephALEXin (KEFLEX) 500 MG capsule Take 1 capsule (500 mg total) by mouth 4 (four) times daily. Patient not taking: Reported on 11/23/2014 08/12/14   Biagio Borg, MD  fesoterodine (TOVIAZ) 8 MG TB24 tablet Take 8 mg by mouth daily.    Historical Provider, MD  Flaxseed, Linseed, (FLAX SEED OIL) 1000 MG CAPS Take 1,000 mg by mouth 2 (two) times daily.    Historical Provider, MD  omega-3 acid ethyl esters (LOVAZA) 1 G capsule Take 3 g by mouth daily.    Historical Provider, MD  ondansetron (ZOFRAN) 4 MG tablet TAKE 1 TABLET BY MOUTH EVERY 8 HOURS AS NEEDED FOR NAUSEA AND VOMITING 08/24/14   Biagio Borg, MD  polyethylene glycol powder (MIRALAX) powder Take 17 g by mouth daily. 08/17/14   Janece Canterbury, MD   BP 161/95 mmHg  Pulse 81  Temp(Src) 98.8 F (37.1 C)  Resp 18  Ht 6' (1.829 m)  Wt 190 lb (86.183 kg)  BMI 25.76 kg/m2  SpO2 97% Physical Exam   Constitutional: He is oriented to person, place, and time. He appears well-developed and well-nourished. No distress.  HENT:  Head: Normocephalic.  Eyes: Conjunctivae and EOM are normal.  Cardiovascular: Normal rate, regular rhythm and intact distal pulses.   Pulmonary/Chest: Effort normal. No stridor.  Musculoskeletal: Normal range of motion.  Neurological: He is alert and oriented to person, place, and time.  Strength is 4 out of 5-5 extremities, distal sensation is grossly intact, pupils are equal round reactive to light, uvula is midline. Finger to nose and heel to shin with no abnormalities. Patient cannot ambulate without assistance, he takes short  shuffling steps. He does have a tendency to fall backwards and needs assistance in order not to fall.  Psychiatric: He has a normal mood and affect.  Nursing note and vitals reviewed.   ED Course  Procedures (including critical care time) Labs Review Labs Reviewed  COMPREHENSIVE METABOLIC PANEL - Abnormal; Notable for the following:    Glucose, Bld 101 (*)    All other components within normal limits  URINALYSIS, ROUTINE W REFLEX MICROSCOPIC - Abnormal; Notable for the following:    Specific Gravity, Urine 1.004 (*)    Leukocytes, UA TRACE (*)    All other components within normal limits  CBC WITH DIFFERENTIAL  URINE MICROSCOPIC-ADD ON  URINE RAPID DRUG SCREEN (HOSP PERFORMED)  CBC  CREATININE, SERUM  HEPATIC FUNCTION PANEL  MAGNESIUM  TSH  TROPONIN I  TROPONIN I  TROPONIN I  HEMOGLOBIN A1C  VITAMIN B12  I-STAT CG4 LACTIC ACID, ED    Imaging Review Dg Chest 2 View  11/23/2014   CLINICAL DATA:  Weakness, worsening over the last few days.  EXAM: CHEST  2 VIEW  COMPARISON:  08/16/2014  FINDINGS: Mild tortuosity and atherosclerosis of the thoracic aorta. Thoracic spondylosis.  The lungs appear clear.  No pleural effusion.  Heart size normal.  IMPRESSION: 1. Chronic atherosclerosis and tortuosity of the thoracic aorta. No  acute findings.   Electronically Signed   By: Sherryl Barters M.D.   On: 11/23/2014 11:46   Ct Head Wo Contrast  11/23/2014   CLINICAL DATA:  Unsteady gait, difficulty walking  EXAM: CT HEAD WITHOUT CONTRAST  TECHNIQUE: Contiguous axial images were obtained from the base of the skull through the vertex without intravenous contrast.  COMPARISON:  Head CT 08/15/2014  FINDINGS: No acute intracranial hemorrhage. No focal mass lesion. No CT evidence of acute infarction. No midline shift or mass effect. No hydrocephalus. Basilar cisterns are patent.  There is generalized cortical atrophy similar to prior. Mild periventricular white matter hypodensities.  Paranasal sinuses and  mastoid air cells are clear.  IMPRESSION: 1. No acute intracranial findings. 2. Atrophy and white matter microvascular disease.   Electronically Signed   By: Suzy Bouchard M.D.   On: 11/23/2014 14:02     EKG Interpretation   Date/Time:  Monday November 23 2014 11:27:24 EST Ventricular Rate:  89 PR Interval:  176 QRS Duration: 98 QT Interval:  361 QTC Calculation: 439 R Axis:   31 Text Interpretation:  Sinus rhythm Low voltage, precordial leads No  significant change since last tracing Confirmed by Debby Freiberg [phone])  on 11/23/2014 12:36:07 PM      MDM   Final diagnoses:  Weakness  Difficulty walking  Ataxia    Filed Vitals:   11/23/14 1200 11/23/14 1230 11/23/14 1300 11/23/14 1401  BP: 171/94 159/93 155/92 161/95  Pulse: 84 85 80 81  Temp:      Resp: 14 19 9 18   Height:      Weight:      SpO2: 97% 98% 97% 97%    Medications  0.9 %  sodium chloride infusion ( Intravenous New Bag/Given 11/23/14 1410)  enoxaparin (LOVENOX) injection 40 mg (not administered)  sodium chloride 0.9 % injection 3 mL (not administered)  acetaminophen (TYLENOL) tablet 650 mg (not administered)    Or  acetaminophen (TYLENOL) suppository 650 mg (not administered)  oxyCODONE (Oxy IR/ROXICODONE) immediate release tablet 5  mg (not administered)  docusate sodium (COLACE) capsule 100 mg (not administered)  ondansetron (ZOFRAN) tablet 4 mg (not administered)  Or  ondansetron (ZOFRAN) injection 4 mg (not administered)  levalbuterol (XOPENEX) nebulizer solution 0.63 mg (not administered)  morphine 2 MG/ML injection 2 mg (2 mg Intravenous Given 11/23/14 1410)  ondansetron (ZOFRAN) injection 4 mg (4 mg Intravenous Given 11/23/14 1410)    [name] is a pleasant 68 y.o. male presenting with generalized decline over the last 2 months of fatigue, difficulty sleeping, decreased by mouth intake and urinary frequency. Of note patient's gait became increasingly unstable over the last 48 hours. Neuro exam is grossly nonfocal however patient is not independently ambulatory he takes small steps and falls backwards. But specific cerebellar testing is negative. Agent is afebrile and well-appearing, vital signs stable. Head CT without acute abnormality. Blood work and urinalysis also reassuring. Due to patient's acute change in status he will need workup for CVA. Case discussed with triad hospitalist Dr. Allyson Sabal who accepts admission. Dr. Shatika Grinnell [facility] will consult on the floor.    Monico Blitz, PA-C 11/23/14 1554  Debby Freiberg, MD 11/28/14 [phone]

## 2014-11-23 NOTE — ED Notes (Signed)
4th floor RN made aware pt will go to MRI and then come up to floor.

## 2014-11-23 NOTE — Progress Notes (Signed)
  CARE MANAGEMENT ED NOTE 11/23/2014  Patient:  [name], [name]   Account Number:  [id number]  Date Initiated:  11/23/2014  Documentation initiated by:  Jackelyn Poling  Subjective/Objective Assessment:   68 yr old [employer] medicare complete [place] pt pt from home with c/o generalized weakness x1 mo, worsening x1 week, also c/o frequent urination with hx UTI.  Hx dementia, at baseline.       Subjective/Objective Assessment Detail:   pcp Dr Cathlean Cower  CM unable to find pt has used home health services previously  During assessment pt pleasantly talking and pulling covers back Pt at intervals made appropriate statements Pt holding CM hand and shaking it  Wife informed CM she was a CNA and "I do not want him to go to a facility.  I will take care of him"  Reports taking care of her other family members also. Wife voiced appreciation of review of home health services and states "I have needed help.  After a while it has become a bit "hard to do by myself"     Action/Plan:   ED CM consulted for home health services Cm spoke with Dr Colin Rhein who wants to evaluate pt more prior to final decision for need of home health.  Pending further evaluation. CM spoke with pt and wife at bedside.  see notes below   Action/Plan Detail:   Anticipated DC Date:  11/23/2014     Status Recommendation to Physician:   Result of Recommendation:    Other ED [place]  Other  Outpatient Services - Pt will follow up   [place]   Choice offered to / List presented to:  C-3 Spouse          Status of service:  Completed, signed off  ED Comments:   ED Comments Detail:  CM reviewed in details medicare guidelines, home health ([place]) (length of stay in home, types of [facility] staff available, coverage, primary caregiver, up to 24 hrs before services may be started), assisted living (ASL- coverage, services offered) and Skilled nursing  facilities (snf- coverage and services offered)  CM reviewed availability of HH SW to assist pcp to get pt to snf (if desired disposition) from the community level. Discussed pt to be further evaluated by unit therapists (PT/OT) for recommendation of level of care and share this with attending MD and unit CM  Discussed with wife that pending further ED evaluation EDP may ask for set up of home health services

## 2014-11-23 NOTE — ED Notes (Signed)
Bed: WA02 Expected date:  Expected time:  Means of arrival:  Comments: weakness 

## 2014-11-23 NOTE — Consult Note (Signed)
Reason for Consult: Progressive gait deterioration.  HPI:                                                                                                                                          [name] is an 68 y.o. male with a history of hyperlipidemia, vitamin B 12 deficiency, stroke in 2011, hypertension schizophrenia and history of dementia, brought to the emergency room by his wife because of progressive gait deterioration over 2 months. He's been unstable to the point of being unable to ambulate without support for the last 2-3 days. His ace of walking has become very slow and wife describes his gait is shuffling with small steps. He's had increasing difficulty standing from a seated position. He's also has a tendency to fall backwards if he is not supported when standing or walking. He said no focal deficits. CT scan of his head today as well as MRI showed no acute intracranial abnormality.  Past Medical History  Diagnosis Date  . Hyperlipidemia   . Vitamin B12 deficiency   . Occasional tremors     leg tremors  . BPH (benign prostatic hypertrophy) with urinary obstruction   . History of CVA (cerebrovascular accident) without residual deficits     2011  . Other degenerative diseases of the basal ganglia     S/P CVA 2011--  CHRONIC LEFT BASAL GANGLIA LACUNAR INFARCT  PER CT  . History of nonmelanoma skin cancer     EXCISION OF NOSE  . Hypertension   . Bladder stone   . Schizophrenia     HX PSYCHIATRIC ADMISSION'S  AND ECT ([place]) [place]  . Bipolar affective disorder   . ED (erectile dysfunction) of organic origin   . Dementia   . Depression   . Constipation   . Generalized weakness     LOWER EXTREMITIES  . Chronic anxiety   . Urinary bladder stone 08/12/2014    Past Surgical History  Procedure Laterality Date  . Transanal excision anal and rectal polyps  11-02-2000  . Transthoracic echocardiogram  02-03-2012    GRADE I DIASTOLIC DYSFUNCTION/  EF 60-65%  .  Cardiovascular stress test  06-18-2013  DR Jenkins [place]    LOW RISK NO EXERCISE LEXISCAN STUDY/  NO ISCHEMIA, CANNOT RULE OUT BASAL INFERIOR INFARCTION BUT POSSIBLE ARTIFACT GIVEN NORMAL WALL MOTION/  EF 73%  . Excision tumor upper left thigh  2012  . Transurethral incision of prostate N/A 10/03/2013    Procedure: TRANSURETHRAL INCISION OF THE PROSTATE (TUIP);  Surgeon: Claybon Jabs, MD;  Location: [facility];  Service: Urology;  Laterality: N/A;  . Cystoscopy with litholapaxy N/A 10/03/2013    Procedure: CYSTOSCOPY WITH LITHOLAPAXY;  Surgeon: Claybon Jabs, MD;  Location: [facility];  Service: Urology;  Laterality: N/A;  . Holmium laser application N/A 76/22/6333    Procedure: HOLMIUM LASER APPLICATION;  Surgeon: Claybon Jabs, MD;  Location: [facility];  Service: Urology;  Laterality: N/A;    Family History  Problem Relation Age of Onset  . Dementia Father   . Dementia Mother   . Colon cancer Neg Hx   . Colon polyps Neg Hx   . Rectal cancer Neg Hx   . Stomach cancer Neg Hx     Social History:  reports that he has been smoking Cigarettes.  He has a 24 pack-year smoking history. He has never used smokeless tobacco. He reports that he does not drink alcohol or use illicit drugs.  No Known Allergies  MEDICATIONS:                                                                                                                     I have reviewed the patient's current medications.   ROS:                                                                                                                                       History obtained from chart review and the patient  General ROS: negative for - chills, fatigue, fever, night sweats, weight gain or weight loss Psychological ROS: As noted in history of present illness Ophthalmic ROS: negative for - blurry vision, double vision, eye pain or loss of vision ENT ROS: negative for -  epistaxis, nasal discharge, oral lesions, sore throat, tinnitus or vertigo Allergy and Immunology ROS: negative for - hives or itchy/watery eyes Hematological and Lymphatic ROS: negative for - bleeding problems, bruising or swollen lymph nodes Endocrine ROS: negative for - galactorrhea, hair pattern changes, polydipsia/polyuria or temperature intolerance Respiratory ROS: negative for - cough, hemoptysis, shortness of breath or wheezing Cardiovascular ROS: negative for - chest pain, dyspnea on exertion, edema or irregular heartbeat Gastrointestinal ROS: negative for - abdominal pain, diarrhea, hematemesis, nausea/vomiting or stool incontinence Genito-Urinary ROS: negative for - dysuria, hematuria, incontinence or urinary frequency/urgency Musculoskeletal ROS: Gait changes as noted in history of present illness Neurological ROS: as noted in HPI Dermatological ROS: negative for rash and skin lesion changes   Blood pressure 151/98, pulse 88, temperature 98.8 F (37.1 C), resp. rate 15, height 6' (1.829 m), weight 86.183 kg (190 lb), SpO2 97 %. Appearance was that of a tall late middle-aged male appearing man of medium build who was alert and in no acute distress.  Neurologic  Examination:                                                                                                      Mental Status: Alert, oriented, thought content appropriate.  Speech slightly dysarthric without evidence of aphasia. Able to follow commands without difficulty. Cranial Nerves: II-Visual fields were normal. III/IV/VI-Pupils were equal and reacted. Extraocular movements were full and conjugate, except for reduced upgaze bilaterally.    V/VII-no facial numbness and no facial weakness. VIII-normal. X-normal speech and symmetrical palatal movement. Motor: Patient had normal strength throughout. Muscle tone was moderately increased throughout. There was minimal cogwheel rigidity at right and left wrists. No tremor  was noted. Sensory: Normal throughout. Deep Tendon Reflexes: 2+ and symmetric. Plantars: Mute bilaterally Cerebellar: Normal finger-to-nose testing.  Lab Results  Component Value Date/Time   CHOL 256* 08/12/2014 09:52 AM    Results for orders placed or performed during the hospital encounter of 11/23/14 (from the past 48 hour(s))  CBC with Differential     Status: None   Collection Time: 11/23/14 11:42 AM  Result Value Ref Range   WBC 7.5 4.0 - 10.5 K/uL   RBC 4.78 4.22 - 5.81 MIL/uL   Hemoglobin 15.4 13.0 - 17.0 g/dL   HCT 43.8 39.0 - 52.0 %   MCV 91.6 78.0 - 100.0 fL   MCH 32.2 26.0 - 34.0 pg   MCHC 35.2 30.0 - 36.0 g/dL   RDW 13.0 11.5 - 15.5 %   Platelets 228 150 - 400 K/uL   Neutrophils Relative % 68 43 - 77 %   Neutro Abs 5.1 1.7 - 7.7 K/uL   Lymphocytes Relative 22 12 - 46 %   Lymphs Abs 1.6 0.7 - 4.0 K/uL   Monocytes Relative 8 3 - 12 %   Monocytes Absolute 0.6 0.1 - 1.0 K/uL   Eosinophils Relative 2 0 - 5 %   Eosinophils Absolute 0.1 0.0 - 0.7 K/uL   Basophils Relative 0 0 - 1 %   Basophils Absolute 0.0 0.0 - 0.1 K/uL  Comprehensive metabolic panel     Status: Abnormal   Collection Time: 11/23/14 11:42 AM  Result Value Ref Range   Sodium 141 137 - 147 mEq/L   Potassium 4.0 3.7 - 5.3 mEq/L   Chloride 103 96 - 112 mEq/L   CO2 23 19 - 32 mEq/L   Glucose, Bld 101 (H) 70 - 99 mg/dL   BUN 11 6 - 23 mg/dL   Creatinine, Ser 0.75 0.50 - 1.35 mg/dL   Calcium 9.2 8.4 - 10.5 mg/dL   Total Protein 6.9 6.0 - 8.3 g/dL   Albumin 4.1 3.5 - 5.2 g/dL   AST 20 0 - 37 U/L    Comment: SLIGHT HEMOLYSIS HEMOLYSIS AT THIS LEVEL MAY AFFECT RESULT    ALT 22 0 - 53 U/L   Alkaline Phosphatase 99 39 - 117 U/L   Total Bilirubin 0.5 0.3 - 1.2 mg/dL   GFR calc non Af Amer >90 >90 mL/min   GFR calc Af Amer >90 >90 mL/min  Comment: (NOTE) The eGFR has been calculated using the CKD EPI equation. This calculation has not been validated in all clinical situations. eGFR's persistently  <90 mL/min signify possible Chronic Kidney Disease.    Anion gap 15 5 - 15  I-Stat CG4 Lactic Acid, ED     Status: None   Collection Time: 11/23/14 11:51 AM  Result Value Ref Range   Lactic Acid, Venous 0.96 0.5 - 2.2 mmol/L  Urinalysis, Routine w reflex microscopic     Status: Abnormal   Collection Time: 11/23/14 11:55 AM  Result Value Ref Range   Color, Urine YELLOW YELLOW   APPearance CLEAR CLEAR   Specific Gravity, Urine 1.004 (L) 1.005 - 1.030   pH 7.0 5.0 - 8.0   Glucose, UA NEGATIVE NEGATIVE mg/dL   Hgb urine dipstick NEGATIVE NEGATIVE   Bilirubin Urine NEGATIVE NEGATIVE   Ketones, ur NEGATIVE NEGATIVE mg/dL   Protein, ur NEGATIVE NEGATIVE mg/dL   Urobilinogen, UA 0.2 0.0 - 1.0 mg/dL   Nitrite NEGATIVE NEGATIVE   Leukocytes, UA TRACE (A) NEGATIVE  Urine microscopic-add on     Status: None   Collection Time: 11/23/14 11:55 AM  Result Value Ref Range   Squamous Epithelial / LPF RARE RARE   WBC, UA 0-2 <3 WBC/hpf    Dg Chest 2 View  11/23/2014   CLINICAL DATA:  Weakness, worsening over the last few days.  EXAM: CHEST  2 VIEW  COMPARISON:  08/16/2014  FINDINGS: Mild tortuosity and atherosclerosis of the thoracic aorta. Thoracic spondylosis.  The lungs appear clear.  No pleural effusion.  Heart size normal.  IMPRESSION: 1. Chronic atherosclerosis and tortuosity of the thoracic aorta. No acute findings.   Electronically Signed   By: Sherryl Barters M.D.   On: 11/23/2014 11:46   Ct Head Wo Contrast  11/23/2014   CLINICAL DATA:  Unsteady gait, difficulty walking  EXAM: CT HEAD WITHOUT CONTRAST  TECHNIQUE: Contiguous axial images were obtained from the base of the skull through the vertex without intravenous contrast.  COMPARISON:  Head CT 08/15/2014  FINDINGS: No acute intracranial hemorrhage. No focal mass lesion. No CT evidence of acute infarction. No midline shift or mass effect. No hydrocephalus. Basilar cisterns are patent.  There is generalized cortical atrophy similar to  prior. Mild periventricular white matter hypodensities.  Paranasal sinuses and  mastoid air cells are clear.  IMPRESSION: 1. No acute intracranial findings. 2. Atrophy and white matter microvascular disease.   Electronically Signed   By: Suzy Bouchard M.D.   On: 11/23/2014 14:02   Mr Brain Wo Contrast  11/23/2014   CLINICAL DATA:  68 year old hypertensive male with hyperlipidemia presenting with confusion and unsteady gait with difficulty walking. Initial encounter.  EXAM: MRI HEAD WITHOUT CONTRAST  TECHNIQUE: Multiplanar, multiecho pulse sequences of the brain and surrounding structures were obtained without intravenous contrast.  COMPARISON:  11/23/2014 CT.  08/07/2012 MR.  FINDINGS: Exam is motion degraded.  No acute infarct.  No intracranial hemorrhage.  Mild periventricular white matter type changes suggestive of result of small vessel disease.  Global atrophy without hydrocephalus.  No intracranial mass lesion noted on this unenhanced exam.  Major intracranial vascular structures are patent and ectatic.  Partially empty sella felt to be an incidental finding.  Cervical medullary junction, pineal region and orbital structures are unremarkable.  Nonspecific right parietal small scalp lesion.  IMPRESSION: Exam is motion degraded.  No acute infarct.  Mild periventricular white matter type changes suggestive of result of small vessel disease.  Global atrophy without hydrocephalus.  No intracranial mass lesion noted on this unenhanced exam.  Nonspecific right parietal small scalp lesion.   Electronically Signed   By: Chauncey Cruel M.D.   On: 11/23/2014 17:23    Assessment/Plan: Patient's progressive gait deterioration as well as postural changes and cervical exam findings are consistent with Parkinson's disease of moderate severity. Gait apraxia has a manifestation of progressive dementia is less likely, as well as vitamin B 12 deficiency. Findings on imaging studies were not indicative of normal pressure  hydrocephalus.  Recommendations: 1. Trial of Sinemet 25-100 one half tablet 3 times a day a starting dose. 2. Physical therapy consult for gait evaluation and recommendations for ambulation safety. 3. Vitamin B 12 level  We will continue to follow this patient with you.  C.R. Nicole Kindred, MD [facility] [phone]  11/23/2014, 5:52 PM

## 2014-11-24 DIAGNOSIS — R262 Difficulty in walking, not elsewhere classified: Secondary | ICD-10-CM

## 2014-11-24 DIAGNOSIS — R270 Ataxia, unspecified: Secondary | ICD-10-CM

## 2014-11-24 LAB — TROPONIN I: Troponin I: <0.03 ng/mL (ref <0.031)

## 2014-11-24 LAB — COMPREHENSIVE METABOLIC PANEL
ALT: 18 U/L (ref 0–53)
ANION GAP: 9 (ref 5–15)
AST: 17 U/L (ref 0–37)
Albumin: 4.3 g/dL (ref 3.5–5.2)
Alkaline Phosphatase: 85 U/L (ref 39–117)
BUN: 11 mg/dL (ref 6–23)
CALCIUM: 8.6 mg/dL (ref 8.4–10.5)
CO2: 25 mmol/L (ref 19–32)
Chloride: 106 mEq/L (ref 96–112)
Creatinine, Ser: 0.72 mg/dL (ref 0.50–1.35)
GFR calc Af Amer: >90 mL/min (ref >90)
GFR calc non Af Amer: >90 mL/min (ref >90)
Glucose, Bld: 96 mg/dL (ref 70–99)
POTASSIUM: 3.2 mmol/L — AB (ref 3.5–5.1)
Sodium: 140 mmol/L (ref 135–145)
TOTAL PROTEIN: 6.9 g/dL (ref 6.0–8.3)
Total Bilirubin: 1.3 mg/dL — ABNORMAL HIGH (ref 0.3–1.2)

## 2014-11-24 LAB — CBC
HCT: 44.7 % (ref 39.0–52.0)
Hemoglobin: 15.2 g/dL (ref 13.0–17.0)
MCH: 31.4 pg (ref 26.0–34.0)
MCHC: 34 g/dL (ref 30.0–36.0)
MCV: 92.4 fL (ref 78.0–100.0)
PLATELETS: 241 10*3/uL (ref 150–400)
RBC: 4.84 MIL/uL (ref 4.22–5.81)
RDW: 13 % (ref 11.5–15.5)
WBC: 8.5 10*3/uL (ref 4.0–10.5)

## 2014-11-24 LAB — RAPID URINE DRUG SCREEN, HOSP PERFORMED
Amphetamines: NOT DETECTED
Barbiturates: NOT DETECTED
Benzodiazepines: POSITIVE — AB
Cocaine: NOT DETECTED
Opiates: NOT DETECTED
TETRAHYDROCANNABINOL: NOT DETECTED

## 2014-11-24 LAB — TSH: TSH: 1.273 u[IU]/mL (ref 0.350–4.500)

## 2014-11-24 MED ORDER — METOCLOPRAMIDE HCL 5 MG/ML IJ SOLN
10.0000 mg | Freq: Once | INTRAMUSCULAR | Status: AC
  Administered 2014-11-24: 10 mg via INTRAVENOUS
  Filled 2014-11-24: qty 2

## 2014-11-24 MED ORDER — CARBIDOPA-LEVODOPA 25-100 MG PO TABS
0.5000 | ORAL_TABLET | Freq: Three times a day (TID) | ORAL | Status: DC
  Administered 2014-11-24 – 2014-11-25 (×4): 0.5 via ORAL
  Filled 2014-11-24 (×6): qty 0.5

## 2014-11-24 MED ORDER — POLYETHYLENE GLYCOL 3350 17 G PO PACK
17.0000 g | PACK | Freq: Every day | ORAL | Status: DC
  Administered 2014-11-24 – 2014-11-25 (×2): 17 g via ORAL
  Filled 2014-11-24 (×2): qty 1

## 2014-11-24 MED ORDER — KETOROLAC TROMETHAMINE 30 MG/ML IJ SOLN
30.0000 mg | Freq: Once | INTRAMUSCULAR | Status: AC
  Administered 2014-11-24: 30 mg via INTRAVENOUS
  Filled 2014-11-24: qty 1

## 2014-11-24 MED ORDER — DIPHENHYDRAMINE HCL 50 MG/ML IJ SOLN
25.0000 mg | Freq: Once | INTRAMUSCULAR | Status: AC
  Administered 2014-11-24: 25 mg via INTRAVENOUS
  Filled 2014-11-24: qty 1

## 2014-11-24 MED ORDER — ENSURE COMPLETE PO LIQD
237.0000 mL | Freq: Three times a day (TID) | ORAL | Status: DC
  Administered 2014-11-24 – 2014-11-25 (×3): 237 mL via ORAL

## 2014-11-24 NOTE — Progress Notes (Signed)
Subjective: No change.  Patient did not receive sinemet yesterday.   Objective: Current vital signs: BP 143/83 mmHg  Pulse 80  Temp(Src) 98.1 F (36.7 C) (Oral)  Resp 18  Ht 6' (1.829 m)  Wt 86.183 kg (190 lb)  BMI 25.76 kg/m2  SpO2 95% Vital signs in last 24 hours: Temp:  [98.1 F (36.7 C)-99.5 F (37.5 C)] 98.1 F (36.7 C) (12/22 0609) Pulse Rate:  [78-101] 80 (12/22 0609) Resp:  [9-19] 18 (12/22 0609) BP: (130-180)/(60-98) 143/83 mmHg (12/22 0609) SpO2:  [95 %-99 %] 95 % (12/22 0609) Weight:  [86.183 kg (190 lb)] 86.183 kg (190 lb) (12/21 1634)  Intake/Output from previous day: 12/21 0701 - 12/22 0700 In: 458.3 [I.V.:458.3] Out: 100 [Urine:100] Intake/Output this shift:   Nutritional status: Diet Heart  Neurologic Exam: Mental Status: Alert, oriented, thought content appropriate. Speech slightly dysarthric without evidence of aphasia. Able to follow commands without difficulty. Cranial Nerves: II-Visual fields were normal. III/IV/VI-Pupils were equal and reacted. Extraocular movements were full and conjugate, except for reduced upgaze bilaterally.  V/VII-no facial numbness and no facial weakness. VIII-normal. X-normal speech and symmetrical palatal movement. Motor: Patient had normal strength throughout. Muscle tone was moderately increased throughout. There was minimal cogwheel rigidity at right and left wrists. No tremor was noted. Sensory: Normal throughout. Deep Tendon Reflexes: 2+ and symmetric. Plantars: Mute bilaterally Cerebellar: Normal finger-to-nose testing.  Lab Results: Basic Metabolic Panel:  Recent Labs Lab 11/23/14 1142 11/23/14 2115  NA 141  --   K 4.0  --   CL 103  --   CO2 23  --   GLUCOSE 101*  --   BUN 11  --   CREATININE 0.75  --   CALCIUM 9.2  --   MG  --  2.0    Liver Function Tests:  Recent Labs Lab 11/23/14 1142 11/23/14 2115  AST 20 17  ALT 22 19  ALKPHOS 99 86  BILITOT 0.5 0.7  PROT 6.9 6.3  ALBUMIN 4.1  3.6   No results for input(s): LIPASE, AMYLASE in the last 168 hours. No results for input(s): AMMONIA in the last 168 hours.  CBC:  Recent Labs Lab 11/23/14 1142 11/24/14 0305  WBC 7.5 8.5  NEUTROABS 5.1  --   HGB 15.4 15.2  HCT 43.8 44.7  MCV 91.6 92.4  PLT 228 241    Cardiac Enzymes:  Recent Labs Lab 11/23/14 2115 11/24/14 0305  CKTOTAL 103  --   TROPONINI <0.30 <0.03    Lipid Panel: No results for input(s): CHOL, TRIG, HDL, CHOLHDL, VLDL, LDLCALC in the last 168 hours.  CBG: No results for input(s): GLUCAP in the last 168 hours.  Microbiology: Results for orders placed or performed during the hospital encounter of 08/15/14  Culture, Urine     Status: None   Collection Time: 08/15/14  4:26 PM  Result Value Ref Range Status   Specimen Description URINE, RANDOM  Final   Special Requests NONE  Final   Culture  Setup Time   Final    08/15/2014 21:35 Performed at [facility] Performed at [facility]  Final   Culture NO GROWTH Performed at [facility]  Final   Report Status 08/16/2014 FINAL  Final  Blood culture (routine x 2)     Status: None   Collection Time: 08/15/14  4:35 PM  Result Value Ref Range Status   Specimen Description BLOOD LEFT ARM  Final  Special Requests BOTTLES DRAWN AEROBIC AND ANAEROBIC [facility]  Final   Culture  Setup Time   Final    08/15/2014 20:49 Performed at [facility]   Culture   Final    NO GROWTH 5 DAYS Performed at [facility]   Report Status 08/21/2014 FINAL  Final  Blood culture (routine x 2)     Status: None   Collection Time: 08/15/14  4:35 PM  Result Value Ref Range Status   Specimen Description BLOOD RIGHT HAND  Final   Special Requests BOTTLES DRAWN AEROBIC AND ANAEROBIC [facility] EACH  Final   Culture  Setup Time   Final    08/15/2014 20:49 Performed at [facility]   Culture   Final    NO GROWTH 5 DAYS Performed at [facility]    Report Status 08/21/2014 FINAL  Final    Coagulation Studies: No results for input(s): LABPROT, INR in the last 72 hours.  Imaging: Dg Chest 2 View  11/23/2014   CLINICAL DATA:  Weakness, worsening over the last few days.  EXAM: CHEST  2 VIEW  COMPARISON:  08/16/2014  FINDINGS: Mild tortuosity and atherosclerosis of the thoracic aorta. Thoracic spondylosis.  The lungs appear clear.  No pleural effusion.  Heart size normal.  IMPRESSION: 1. Chronic atherosclerosis and tortuosity of the thoracic aorta. No acute findings.   Electronically Signed   By: Sherryl Barters M.D.   On: 11/23/2014 11:46   Ct Head Wo Contrast  11/23/2014   CLINICAL DATA:  Unsteady gait, difficulty walking  EXAM: CT HEAD WITHOUT CONTRAST  TECHNIQUE: Contiguous axial images were obtained from the base of the skull through the vertex without intravenous contrast.  COMPARISON:  Head CT 08/15/2014  FINDINGS: No acute intracranial hemorrhage. No focal mass lesion. No CT evidence of acute infarction. No midline shift or mass effect. No hydrocephalus. Basilar cisterns are patent.  There is generalized cortical atrophy similar to prior. Mild periventricular white matter hypodensities.  Paranasal sinuses and  mastoid air cells are clear.  IMPRESSION: 1. No acute intracranial findings. 2. Atrophy and white matter microvascular disease.   Electronically Signed   By: Suzy Bouchard M.D.   On: 11/23/2014 14:02   Mr Brain Wo Contrast  11/23/2014   CLINICAL DATA:  68 year old hypertensive male with hyperlipidemia presenting with confusion and unsteady gait with difficulty walking. Initial encounter.  EXAM: MRI HEAD WITHOUT CONTRAST  TECHNIQUE: Multiplanar, multiecho pulse sequences of the brain and surrounding structures were obtained without intravenous contrast.  COMPARISON:  11/23/2014 CT.  08/07/2012 MR.  FINDINGS: Exam is motion degraded.  No acute infarct.  No intracranial hemorrhage.  Mild periventricular white matter type changes  suggestive of result of small vessel disease.  Global atrophy without hydrocephalus.  No intracranial mass lesion noted on this unenhanced exam.  Major intracranial vascular structures are patent and ectatic.  Partially empty sella felt to be an incidental finding.  Cervical medullary junction, pineal region and orbital structures are unremarkable.  Nonspecific right parietal small scalp lesion.  IMPRESSION: Exam is motion degraded.  No acute infarct.  Mild periventricular white matter type changes suggestive of result of small vessel disease.  Global atrophy without hydrocephalus.  No intracranial mass lesion noted on this unenhanced exam.  Nonspecific right parietal small scalp lesion.   Electronically Signed   By: Chauncey Cruel M.D.   On: 11/23/2014 17:23    Medications:  Scheduled: . ALPRAZolam  0.5 mg Oral 3 times per day  .  amLODipine  10 mg Oral Daily  . carbidopa-levodopa  0.5 tablet Oral TID  . docusate sodium  100 mg Oral BID  . feeding supplement (ENSURE COMPLETE)  237 mL Oral BID BM  . fesoterodine  8 mg Oral Daily  . omega-3 acid ethyl esters  3 g Oral Daily  . pantoprazole  40 mg Oral Daily  . polyethylene glycol powder  17 g Oral Daily  . sodium chloride  3 mL Intravenous Q12H  . vitamin B-12  1,000 mcg Oral Daily    Assessment/Plan:  Patient's progressive gait deterioration as well as postural changes and cervical exam findings are consistent with Parkinson's disease of moderate severity. Gait apraxia has a manifestation of progressive dementia is less likely, as well as vitamin B 12 deficiency. Findings on imaging studies were not indicative of normal pressure hydrocephalus. patient did not receive Sinemet last night and will receive today.  PT will see patient now and then reassess after sinemet given.   Recommendations: 1. Trial of Sinemet 25-100 one half tablet 3 times a day a starting dose. 2. Physical therapy consult for gait evaluation and recommendations for ambulation  safety. 3. B12 pending    Etta Quill PA-C [facility] [phone]  11/24/2014, 9:03 AM

## 2014-11-24 NOTE — Progress Notes (Signed)
INITIAL NUTRITION ASSESSMENT  DOCUMENTATION CODES Per approved criteria  -Not Applicable   INTERVENTION: - Ensure Complete po TID, each supplement provides 350 kcal and 13 grams of protein - Encourage adequate po intake at meals and snacks.  NUTRITION DIAGNOSIS: Inadequate oral intake related to AMS as evidenced by poor po.   Goal: Pt to meet >/= 90% of their estimated nutrition needs   Monitor:  Weight trend, po intake, acceptance of supplements, labs  Reason for Assessment: MST  68 y.o. male  Admitting Dx: <principal problem not specified>  ASSESSMENT: 68 year old male with a history of CVA, hypertension, schizophrenia, dementia presented to the ER with change in behavior since last week. The patient has complained of insomnia, headache, nausea, decreased appetite and difficulty walking.   - Pt with suspicion for Parkinson's Disease per neurology note.  - Pt asleep during RD visit. Nutritional history obtained from RN and pt chart. Per chart history, pt with 13 lb wt loss in the past 3 months. Pt drank Ensure today as well as juice. He was not awake to eat lunch. Per RN, pt ate some breakfast this morning. Will order nutritional supplements.  - Pt with no signs of fat or muscle wasting.   Labs: Na and BUN WNL K low  Height: Ht Readings from Last 1 Encounters:  11/23/14 6' (1.829 m)    Weight: Wt Readings from Last 1 Encounters:  11/23/14 190 lb (86.183 kg)    Ideal Body Weight: 77.6 kg  % Ideal Body Weight: 111%  Wt Readings from Last 10 Encounters:  11/23/14 190 lb (86.183 kg)  11/23/14 190 lb (86.183 kg)  08/20/14 203 lb 12 oz (92.42 kg)  08/17/14 195 lb 11.2 oz (88.769 kg)  08/12/14 200 lb (90.719 kg)  12/02/13 200 lb 12 oz (91.06 kg)  11/01/13 197 lb (89.359 kg)  10/03/13 203 lb (92.08 kg)  08/22/13 201 lb (91.173 kg)  08/19/13 201 lb 8 oz (91.4 kg)    Usual Body Weight: 200-203 lbs  % Usual Body Weight: 95%  BMI:  Body mass index is 25.76  kg/(m^2).  Estimated Nutritional Needs: Kcal: 2150-2300 Protein: 115-130 g Fluid: 2.2-2.3 L/day  Skin: Intact  Diet Order: Diet Heart  EDUCATION NEEDS: -Education needs addressed   Intake/Output Summary (Last 24 hours) at 11/24/14 1259 Last data filed at 11/24/14 0900  Gross per 24 hour  Intake 658.33 ml  Output    100 ml  Net 558.33 ml    Last BM: prior to admission   Labs:   Recent Labs Lab 11/23/14 1142 11/23/14 2115 11/24/14 0830  NA 141  --  140  K 4.0  --  3.2*  CL 103  --  106  CO2 23  --  25  BUN 11  --  11  CREATININE 0.75  --  0.72  CALCIUM 9.2  --  8.6  MG  --  2.0  --   GLUCOSE 101*  --  96    CBG (last 3)  No results for input(s): GLUCAP in the last 72 hours.  Scheduled Meds: . ALPRAZolam  0.5 mg Oral 3 times per day  . amLODipine  10 mg Oral Daily  . carbidopa-levodopa  0.5 tablet Oral TID  . docusate sodium  100 mg Oral BID  . feeding supplement (ENSURE COMPLETE)  237 mL Oral BID BM  . fesoterodine  8 mg Oral Daily  . omega-3 acid ethyl esters  3 g Oral Daily  . pantoprazole  40  mg Oral Daily  . polyethylene glycol  17 g Oral Daily  . sodium chloride  3 mL Intravenous Q12H  . vitamin B-12  1,000 mcg Oral Daily    Continuous Infusions: . sodium chloride 100 mL/hr at 11/24/14 0300    Past Medical History  Diagnosis Date  . Hyperlipidemia   . Vitamin B12 deficiency   . Occasional tremors     leg tremors  . BPH (benign prostatic hypertrophy) with urinary obstruction   . History of CVA (cerebrovascular accident) without residual deficits     2011  . Other degenerative diseases of the basal ganglia     S/P CVA 2011--  CHRONIC LEFT BASAL GANGLIA LACUNAR INFARCT  PER CT  . History of nonmelanoma skin cancer     EXCISION OF NOSE  . Hypertension   . Bladder stone   . Schizophrenia     HX PSYCHIATRIC ADMISSION'S  AND ECT ([place]) [place]  . Bipolar affective disorder   . ED (erectile dysfunction) of organic origin   . Dementia   .  Depression   . Constipation   . Generalized weakness     LOWER EXTREMITIES  . Chronic anxiety   . Urinary bladder stone 08/12/2014    Past Surgical History  Procedure Laterality Date  . Transanal excision anal and rectal polyps  11-02-2000  . Transthoracic echocardiogram  02-03-2012    GRADE I DIASTOLIC DYSFUNCTION/  EF 60-65%  . Cardiovascular stress test  06-18-2013  DR Jenkins [place]    LOW RISK NO EXERCISE LEXISCAN STUDY/  NO ISCHEMIA, CANNOT RULE OUT BASAL INFERIOR INFARCTION BUT POSSIBLE ARTIFACT GIVEN NORMAL WALL MOTION/  EF 73%  . Excision tumor upper left thigh  2012  . Transurethral incision of prostate N/A 10/03/2013    Procedure: TRANSURETHRAL INCISION OF THE PROSTATE (TUIP);  Surgeon: Claybon Jabs, MD;  Location: [facility];  Service: Urology;  Laterality: N/A;  . Cystoscopy with litholapaxy N/A 10/03/2013    Procedure: CYSTOSCOPY WITH LITHOLAPAXY;  Surgeon: Claybon Jabs, MD;  Location: [facility];  Service: Urology;  Laterality: N/A;  . Holmium laser application N/A 37/48/2707    Procedure: HOLMIUM LASER APPLICATION;  Surgeon: Claybon Jabs, MD;  Location: [facility];  Service: Urology;  Laterality: N/A;    Laurette Schimke MS, RD, LDN

## 2014-11-24 NOTE — Evaluation (Signed)
Occupational Therapy Evaluation Patient Details Name: [name] MRN: [id number] DOB: 1946/08/27 Today's Date: 11/24/2014    History of Present Illness Pt is a 68 year old male with a history of hyperlipidemia, vitamin B 12 deficiency, stroke in 2011, hypertension, schizophrenia, and history of dementia, admitted with progressive gait deterioration over 2 months with neurology suspecting Parkinsons.     Clinical Impression   Pt was assisted for bathing, dressing and shaving prior to admission.  He stood to shower.  Pt was able to self feed and toilet independently.  Pt presents with poor standing balance interfering with ability to perform ADL in standing and ADL transfers.  Educated pt and wife in the safety benefits of grab bars, shower seat/3 in 1 for safety.  Wife stating she did not believe in getting equipment that makes him more dependent. Will follow acutely.   Follow Up Recommendations  Home health OT;Supervision/Assistance - 24 hour    Equipment Recommendations  Other (comment) (wife declines)    Recommendations for Other Services       Precautions / Restrictions Precautions Precautions: Fall Restrictions Weight Bearing Restrictions: No      Mobility Bed Mobility Overal bed mobility: Needs Assistance Bed Mobility: Supine to Sit;Sit to Supine     Supine to sit: Min assist Sit to supine: Min assist   General bed mobility comments: assist for trunk upright and LEs lifted onto bed  Transfers Overall transfer level: Needs assistance Equipment used: Rolling walker (2 wheeled) Transfers: Sit to/from [facility] Sit to Stand: Min assist Stand pivot transfers: Min assist       General transfer comment: braces LEs on bed to assist with rising, assist to weight shift forward    Balance                                            ADL Overall ADL's : Needs assistance/impaired Eating/Feeding: Set up;Sitting   Grooming:  Wash/dry hands;Wash/dry face;Set up;Sitting   Upper Body Bathing: Sitting;Moderate assistance   Lower Body Bathing: Sit to/from stand;Maximal assistance   Upper Body Dressing : Minimal assistance;Sitting   Lower Body Dressing: Moderate assistance;Sit to/from stand   Toilet Transfer: Stand-pivot;Minimal assistance (simulated to chair)                   Vision                 Additional Comments: wears reading glasses   Perception     Praxis      Pertinent Vitals/Pain Pain Assessment: 0-10 Pain Score: 7  Pain Location: head Pain Descriptors / Indicators: Aching Pain Intervention(s): RN gave pain meds during session     Hand Dominance Right   Extremity/Trunk Assessment Upper Extremity Assessment Upper Extremity Assessment: Overall WFL for tasks assessed   Lower Extremity Assessment Lower Extremity Assessment: Defer to PT evaluation   Cervical / Trunk Assessment Cervical / Trunk Assessment: Other exceptions Cervical / Trunk Exceptions: forward head posture, rounded shoulders   Communication Communication Communication: Expressive difficulties (dysrthria)   Cognition Arousal/Alertness: Awake/alert Behavior During Therapy: Flat affect Overall Cognitive Status: History of cognitive impairments - at baseline       Memory: Decreased short-term memory             General Comments       Exercises       Shoulder Instructions  Home Living Family/patient expects to be discharged to:: Private residence Living Arrangements: Spouse/significant other Available Help at Discharge: Family;Available 24 hours/day Type of Home: House Home Access: Stairs to enter [employer] of Steps: 2 Entrance Stairs-Rails: Right;Left;Can reach both Home Layout: One level     Bathroom Shower/Tub: Occupational psychologist: Standard     Home Equipment: Environmental consultant - 2 wheels   Additional Comments: wife is a former [employer]arry manager      Prior  Functioning/Environment Level of Independence: Needs assistance  Gait / Transfers Assistance Needed: walked with no device ADL's / Homemaking Assistance Needed: Pt toileted independently, assisted for LB dressing, total assist for showering, self fed, assisted to shave   Comments: requiring more assist for ambulation lately    OT Diagnosis: Generalized weakness;Cognitive deficits   OT Problem List: Impaired balance (sitting and/or standing);Decreased cognition;Decreased safety awareness;Decreased knowledge of use of DME or AE;Pain   OT Treatment/Interventions: Self-care/ADL training;DME and/or AE instruction;Therapeutic activities;Patient/family education;Balance training    OT Goals(Current goals can be found in the care plan section) Acute Rehab OT Goals Patient Stated Goal: go home OT Goal Formulation: With patient Time For Goal Achievement: 12/08/14 Potential to Achieve Goals: Good  OT Frequency: Min 2X/week   Barriers to D/C:            Co-evaluation              End of Session Equipment Utilized During Treatment: Gait belt;Rolling walker  Activity Tolerance: Patient tolerated treatment well Patient left: in bed;with call bell/phone within reach;with family/visitor present   Time: 1342-1405 OT Time Calculation (min): 23 min Charges:  OT General Charges $OT Visit: 1 Procedure OT Evaluation $Initial OT Evaluation Tier I: 1 Procedure OT Treatments $Self Care/Home Management : 8-22 mins G-Codes:    Malka So 11/24/2014, 2:16 PM  [phone]

## 2014-11-24 NOTE — Progress Notes (Addendum)
[facility] PROGRESS NOTE  [name] WEX:[id number] DOB: 08-11-1946 DOA: 11/23/2014 PCP: Cathlean Cower, MD  Assessment/Plan: Active Problems:   Ataxia    Altered mental status secondary to worsening dementia,  No findings of a CVA or hydrocephalus Update patient's home medication list Neurology suspects Parkinson disease and the patient has been started on Sinemet TSH within normal limits Vitamin B-12 level pending PT/OT/evaluation pending   bipolar disorder, dementia, depression Review of patient's medications Total CK is normal Continue Xanax  Hypertension-improving Continue Norvasc   BPH Continue Flomax  History of CVA Patient has discontinued aspirin because of petechiae according to the patient's wife   Code Status: full  Code Status: full Family Communication: family updated about patient's clinical progress Disposition Plan:  As above    Brief narrative: 68 y.o. male with a history of hyperlipidemia, vitamin B 12 deficiency, stroke in 2011, hypertension schizophrenia and history of dementia, brought to the emergency room by his wife because of progressive gait deterioration over 2 months. He's been unstable to the point of being unable to ambulate without support for the last 2-3 days. His ace of walking has become very slow and wife describes his gait is shuffling with small steps. He's had increasing difficulty standing from a seated position. He's also has a tendency to fall backwards if he is not supported when standing or walking. He said no focal deficits. CT scan of his head today as well as MRI showed no acute intracranial abnormality  Consultants:  Neurology  Procedures:  **None  Antibiotics: None  HPI/Subjective: No change. Patient did not receive sinemet yesterday.   Objective: Filed Vitals:   11/23/14 1800 11/23/14 2121 11/24/14 0609 11/24/14 0926  BP: 159/90 130/60 143/83 128/84  Pulse: 101 87 80 92  Temp: 99.5 F (37.5  C) 98.2 F (36.8 C) 98.1 F (36.7 C)   TempSrc: Oral Oral Oral   Resp: 19 18 18    Height:      Weight:      SpO2: 95% 98% 95%     Intake/Output Summary (Last 24 hours) at 11/24/14 1121 Last data filed at 11/24/14 0900  Gross per 24 hour  Intake 658.33 ml  Output    100 ml  Net 558.33 ml    Exam:  General: alert & oriented x 2 self, In NAD  Cardiovascular: RRR, nl S1 s2  Respiratory: Decreased breath sounds at the bases, scattered rhonchi, no crackles  Abdomen: soft +BS NT/ND, no masses palpable  Extremities: No cyanosis and no edema Neurologic-patient has a very flat affect, communicative, no gross focal neurologic deficits      Data Reviewed: Basic Metabolic Panel:  Recent Labs Lab 11/23/14 1142 11/23/14 2115 11/24/14 0830  NA 141  --  140  K 4.0  --  3.2*  CL 103  --  106  CO2 23  --  25  GLUCOSE 101*  --  96  BUN 11  --  11  CREATININE 0.75  --  0.72  CALCIUM 9.2  --  8.6  MG  --  2.0  --     Liver Function Tests:  Recent Labs Lab 11/23/14 1142 11/23/14 2115 11/24/14 0830  AST 20 17 17   ALT 22 19 18   ALKPHOS 99 86 85  BILITOT 0.5 0.7 1.3*  PROT 6.9 6.3 6.9  ALBUMIN 4.1 3.6 4.3   No results for input(s): LIPASE, AMYLASE in the last 168 hours. No results for input(s): AMMONIA in the last 168 hours.  CBC:  Recent Labs Lab 11/23/14 1142 11/24/14 0305  WBC 7.5 8.5  NEUTROABS 5.1  --   HGB 15.4 15.2  HCT 43.8 44.7  MCV 91.6 92.4  PLT 228 241    Cardiac Enzymes:  Recent Labs Lab 11/23/14 2115 11/24/14 0305 11/24/14 0830  CKTOTAL 103  --   --   TROPONINI <0.30 <0.03 <0.03   BNP (last 3 results) No results for input(s): PROBNP in the last 8760 hours.   CBG: No results for input(s): GLUCAP in the last 168 hours.  No results found for this or any previous visit (from the past 240 hour(s)).   Studies: Dg Chest 2 View  11/23/2014   CLINICAL DATA:  Weakness, worsening over the last few days.  EXAM: CHEST  2 VIEW  COMPARISON:   08/16/2014  FINDINGS: Mild tortuosity and atherosclerosis of the thoracic aorta. Thoracic spondylosis.  The lungs appear clear.  No pleural effusion.  Heart size normal.  IMPRESSION: 1. Chronic atherosclerosis and tortuosity of the thoracic aorta. No acute findings.   Electronically Signed   By: Sherryl Barters M.D.   On: 11/23/2014 11:46   Ct Head Wo Contrast  11/23/2014   CLINICAL DATA:  Unsteady gait, difficulty walking  EXAM: CT HEAD WITHOUT CONTRAST  TECHNIQUE: Contiguous axial images were obtained from the base of the skull through the vertex without intravenous contrast.  COMPARISON:  Head CT 08/15/2014  FINDINGS: No acute intracranial hemorrhage. No focal mass lesion. No CT evidence of acute infarction. No midline shift or mass effect. No hydrocephalus. Basilar cisterns are patent.  There is generalized cortical atrophy similar to prior. Mild periventricular white matter hypodensities.  Paranasal sinuses and  mastoid air cells are clear.  IMPRESSION: 1. No acute intracranial findings. 2. Atrophy and white matter microvascular disease.   Electronically Signed   By: Suzy Bouchard M.D.   On: 11/23/2014 14:02   Mr Brain Wo Contrast  11/23/2014   CLINICAL DATA:  68 year old hypertensive male with hyperlipidemia presenting with confusion and unsteady gait with difficulty walking. Initial encounter.  EXAM: MRI HEAD WITHOUT CONTRAST  TECHNIQUE: Multiplanar, multiecho pulse sequences of the brain and surrounding structures were obtained without intravenous contrast.  COMPARISON:  11/23/2014 CT.  08/07/2012 MR.  FINDINGS: Exam is motion degraded.  No acute infarct.  No intracranial hemorrhage.  Mild periventricular white matter type changes suggestive of result of small vessel disease.  Global atrophy without hydrocephalus.  No intracranial mass lesion noted on this unenhanced exam.  Major intracranial vascular structures are patent and ectatic.  Partially empty sella felt to be an incidental finding.   Cervical medullary junction, pineal region and orbital structures are unremarkable.  Nonspecific right parietal small scalp lesion.  IMPRESSION: Exam is motion degraded.  No acute infarct.  Mild periventricular white matter type changes suggestive of result of small vessel disease.  Global atrophy without hydrocephalus.  No intracranial mass lesion noted on this unenhanced exam.  Nonspecific right parietal small scalp lesion.   Electronically Signed   By: Chauncey Cruel M.D.   On: 11/23/2014 17:23    Scheduled Meds: . ALPRAZolam  0.5 mg Oral 3 times per day  . amLODipine  10 mg Oral Daily  . carbidopa-levodopa  0.5 tablet Oral TID  . docusate sodium  100 mg Oral BID  . feeding supplement (ENSURE COMPLETE)  237 mL Oral BID BM  . fesoterodine  8 mg Oral Daily  . omega-3 acid ethyl esters  3 g Oral Daily  .  pantoprazole  40 mg Oral Daily  . polyethylene glycol  17 g Oral Daily  . sodium chloride  3 mL Intravenous Q12H  . vitamin B-12  1,000 mcg Oral Daily   Continuous Infusions: . sodium chloride 100 mL/hr at 11/24/14 0300    Active Problems:   Ataxia    Time spent: 40 minutes   [employer] [facility] Pager [phone]. If 7PM-7AM, please contact night-coverage at [email or web address], password [facility] 11/24/2014, 11:21 AM  LOS: 1 day

## 2014-11-24 NOTE — Progress Notes (Signed)
Physical Therapy Treatment Note    11/24/14 1300  PT Visit Information  Last PT Received On 11/24/14  Assistance Needed +1  History of Present Illness Pt is a 68 year old male with a history of hyperlipidemia, vitamin B 12 deficiency, stroke in 2011, hypertension, schizophrenia, and history of dementia, admitted with progressive gait deterioration over 2 months with neurology suspecting Parkinsons.    PT Time Calculation  PT Start Time (ACUTE ONLY) 1047  PT Stop Time (ACUTE ONLY) 1058  PT Time Calculation (min) (ACUTE ONLY) 11 min  Subjective Data  Subjective Called RN to confirm pt had taken Sinemet.  Pt assisted with ambulation and gait somewhat improved however still with some shuffling however bigger steps.  Pt also reports feeling less stiff.  Spouse not present upon second visit, pt will likely need some assist upon d/c at first for safety.  If spouse feels unable to provide, pt may need SNF.  Precautions  Precautions Fall  Pain Assessment  Pain Assessment No/denies pain  Cognition  Arousal/Alertness Awake/alert  Behavior During Therapy Flat affect  Overall Cognitive Status Within Functional Limits for tasks assessed  Bed Mobility  Overal bed mobility Needs Assistance  Bed Mobility Supine to Sit;Sit to Supine  Supine to sit Min assist  Sit to supine Min assist  General bed mobility comments assist for trunk upright and LEs lifted onto bed  Transfers  Overall transfer level Needs assistance  Equipment used Rolling walker (2 wheeled)  Transfers Sit to/from Stand  Sit to Stand Min assist  General transfer comment some assist required however better with following cues this session and movement more coordinated, posterior leg lean against bed for rise, cue again for forward trunk and hip flexion for sitting  Ambulation/Gait  Ambulation/Gait assistance Min guard  Ambulation Distance (Feet) 200 Feet  Assistive device None  Gait Pattern/deviations Step-through pattern;Shuffle   General Gait Details pt still with shuffling pattern however improved since earlier as well as better step length, pt initially started with RW however was lifting RW to ambulate (due to squeakiness), so attempted without assistive device, and pt did well with less stiff movement, no LOB however min/guard for safety, pt able to stop and start again without difficulty as well  PT - End of Session  Equipment Utilized During Treatment Gait belt  Activity Tolerance Patient tolerated treatment well  Patient left in bed;with call bell/phone within reach;with bed alarm set  PT - Assessment/Plan  PT Plan Current plan remains appropriate  PT Frequency (ACUTE ONLY) Min 3X/week  Follow Up Recommendations Home health PT;Supervision/Assistance - 24 hour  PT equipment None recommended by PT (if pt does not own RW, would benefit for safety)  PT Goal Progression  Progress towards PT goals Progressing toward goals  PT General Charges  $$ ACUTE PT VISIT 1 Procedure  PT Treatments  $Gait Training 8-22 mins   Carmelia Bake, PT, DPT 11/24/2014 Pager: [phone]

## 2014-11-24 NOTE — Evaluation (Signed)
Clinical/Bedside Swallow Evaluation Patient Details  Name: MUADH CREASY MRN: [id number] Date of Birth: August 08, 1946  Today's Date: 11/24/2014 Time: 0924-1000 SLP Time Calculation (min) (ACUTE ONLY): 36 min  Past Medical History:  Past Medical History  Diagnosis Date  . Hyperlipidemia   . Vitamin B12 deficiency   . Occasional tremors     leg tremors  . BPH (benign prostatic hypertrophy) with urinary obstruction   . History of CVA (cerebrovascular accident) without residual deficits     2011  . Other degenerative diseases of the basal ganglia     S/P CVA 2011--  CHRONIC LEFT BASAL GANGLIA LACUNAR INFARCT  PER CT  . History of nonmelanoma skin cancer     EXCISION OF NOSE  . Hypertension   . Bladder stone   . Schizophrenia     HX PSYCHIATRIC ADMISSION'S  AND ECT ([place]) [place]  . Bipolar affective disorder   . ED (erectile dysfunction) of organic origin   . Dementia   . Depression   . Constipation   . Generalized weakness     LOWER EXTREMITIES  . Chronic anxiety   . Urinary bladder stone 08/12/2014   Past Surgical History:  Past Surgical History  Procedure Laterality Date  . Transanal excision anal and rectal polyps  11-02-2000  . Transthoracic echocardiogram  02-03-2012    GRADE I DIASTOLIC DYSFUNCTION/  EF 60-65%  . Cardiovascular stress test  06-18-2013  DR Jenkins [place]    LOW RISK NO EXERCISE LEXISCAN STUDY/  NO ISCHEMIA, CANNOT RULE OUT BASAL INFERIOR INFARCTION BUT POSSIBLE ARTIFACT GIVEN NORMAL WALL MOTION/  EF 73%  . Excision tumor upper left thigh  2012  . Transurethral incision of prostate N/A 10/03/2013    Procedure: TRANSURETHRAL INCISION OF THE PROSTATE (TUIP);  Surgeon: Claybon Jabs, MD;  Location: [facility];  Service: Urology;  Laterality: N/A;  . Cystoscopy with litholapaxy N/A 10/03/2013    Procedure: CYSTOSCOPY WITH LITHOLAPAXY;  Surgeon: Claybon Jabs, MD;  Location: [facility];  Service: Urology;  Laterality: N/A;  .  Holmium laser application N/A 85/46/2703    Procedure: HOLMIUM LASER APPLICATION;  Surgeon: Claybon Jabs, MD;  Location: [facility];  Service: Urology;  Laterality: N/A;   HPI:  68 yo male with h/o bipolar d/o, schizophrenia, constipation, CVA 2011 admitted with ataxia (pt fell off couch and had glazed over eyes per EMS report).  Pt being seen by neuro and with suspected parkinsons' disease.  CXR negative.  Swallow evaluation ordered.    Assessment / Plan / Recommendation Clinical Impression  Pt presents with suspected mild chronic dysphagia likely due to probable Parkinson's for which he appears to be compensating well.  Pt's CXR negative and per spouse/pt he is maintaining weight and is staying hydrated.   Pt is hypophonic with rapid rate of speech - likely impacting swallowing ability/oral transiting coordination.  Suspect episodic aspiration of thin as spouse reports pt mainly coughs with water.  Pt did cough with approximately 20% of thin boluses today, suspect penetration/? Aspiration.  Better tolerance with small single boluses with oral cavity clear of food.    Pt with poor awareness to his coughing - again contributing to thought of chronic deficits.  At this point, given pt  CXR is negative and strategies reviewed to mitigate asp risk, recommend to continue diet with precautions.  As Parkinson's progresses, pt may benefit from instrumental swallow evaluation.  Educated pt and spouse to importance to maintain vocal  strength, cough strength and mobillity for aspiration clearance/tolerance.  Advised RN, pt, and spouse using teach back.  Will follow up for tolerance x1.      Aspiration Risk  Mild    Diet Recommendation Regular;Thin liquid (provided spouse with sample thickener packets if needed in future)   Liquid Administration via: Cup;Straw Medication Administration: Whole meds with liquid (with pudding if problematic) Supervision: Patient able to self  feed Compensations: Slow rate;Small sips/bites Postural Changes and/or Swallow Maneuvers: Seated upright 90 degrees;Upright 30-60 min after meal    Other  Recommendations Oral Care Recommendations: Oral care BID   Follow Up Recommendations  None    Frequency and Duration min 1 x/week  1 week   Pertinent Vitals/Pain Low grade fever, decreased     Swallow Study Prior Functional Status   see hhx    General Date of Onset: 11/24/14 HPI: 68 yo male with h/o bipolar d/o, schizophrenia, constipation, CVA 2011 admitted with ataxia (pt fell off couch and had glazed over eyes per EMS report).  Pt being seen by neuro and with suspected parkinsons' disease.  CXR negative.  Swallow evaluation ordered.  Type of Study: Bedside swallow evaluation Diet Prior to this Study: Regular;Thin liquids Temperature Spikes Noted: Yes Respiratory Status: Nasal cannula History of Recent Intubation: No Behavior/Cognition: Alert;Cooperative;Pleasant mood Oral Cavity - Dentition: Dentures, top;Dentures, bottom Self-Feeding Abilities: Able to feed self Patient Positioning: Upright in bed Baseline Vocal Quality: Low vocal intensity;Hoarse (hypophonic, masked facies) Volitional Cough: Strong Volitional Swallow: Able to elicit    Oral/Motor/Sensory Function Overall Oral Motor/Sensory Function:  (generalized weakness, ataxic movement, rapid rate of speech (pressed speech) and hyperphonic)   Ice Chips Ice chips: Not tested   Thin Liquid Thin Liquid: Impaired Oral Phase Impairments: Other (comment) (discoordination of oral bolus) Other Comments: occasional cough noted with thin -suspect premature spillage of liquid into pharynx/larynx - cough with approximately 20% of trials - increased episodes when consuming mixed consistencies    Nectar Thick Nectar Thick Liquid: Not tested   Honey Thick Honey Thick Liquid: Not tested   Puree Puree: Within functional limits   Solid   GO    Solid: Impaired Presentation:  Self Fed Oral Phase Impairments: Reduced lingual movement/coordination;Impaired anterior to posterior transit Oral Phase Functional Implications: Other (comment) (prolonged transit but adequate clearance)      Luanna Salk, [facility] SLP [phone]

## 2014-11-24 NOTE — Evaluation (Signed)
Physical Therapy Evaluation Patient Details Name: [name] MRN: [id number] DOB: Mar 15, 1946 Today's Date: 11/24/2014   History of Present Illness  Pt is a 68 year old male with a history of hyperlipidemia, vitamin B 12 deficiency, stroke in 2011, hypertension, schizophrenia, and history of dementia, admitted with progressive gait deterioration over 2 months with neurology suspecting Parkinsons.    Clinical Impression  Pt admitted with above diagnosis. Pt currently with functional limitations due to the deficits listed below (see PT Problem List).  Pt will benefit from skilled PT to increase their independence and safety with mobility to allow discharge to the venue listed below.   Pt assisted with mobilizing prior to receiving Sinemet. RN to notify PT after giving meds and PT will return to reassess mobility.     Follow Up Recommendations Home health PT;Supervision/Assistance - 24 hour    Equipment Recommendations  None recommended by PT (pt reports he has a RW)    Recommendations for Other Services       Precautions / Restrictions Precautions Precautions: Fall      Mobility  Bed Mobility Overal bed mobility: Needs Assistance Bed Mobility: Supine to Sit;Sit to Supine     Supine to sit: Min assist Sit to supine: Mod assist   General bed mobility comments: assist for trunk upright and LEs lifted onto bed  Transfers Overall transfer level: Needs assistance Equipment used: Rolling walker (2 wheeled) Transfers: Sit to/from Stand Sit to Stand: Min assist         General transfer comment: verbal cues for hand placement, cues for forward lean upon rise and descent, posterior lean against bed upon standing  Ambulation/Gait Ambulation/Gait assistance: Min assist Ambulation Distance (Feet): 10 Feet Assistive device: Rolling walker (2 wheeled) Gait Pattern/deviations: Step-through pattern;Shuffle;Ataxic;Decreased stride length;Narrow base of support     General Gait  Details: shuffling pattern, decreased coordination, difficulty following and/or performing turning around cues once closer to bed  Stairs            Wheelchair Mobility    Modified Rankin (Stroke Patients Only)       Balance                                             Pertinent Vitals/Pain Pain Assessment: No/denies pain Pain Location: head Pain Intervention(s): RN gave pain meds during session    Home Living Family/patient expects to be discharged to:: Private residence Living Arrangements: Spouse/significant other   Type of Home: House Home Access: Stairs to enter Entrance Stairs-Rails: None Technical brewer of Steps: 2 Home Layout: One level Home Equipment: Environmental consultant - 2 wheels      Prior Function Level of Independence: Independent         Comments: requiring more assist for ambulation lately     Hand Dominance        Extremity/Trunk Assessment               Lower Extremity Assessment: Generalized weakness      Cervical / Trunk Assessment: Other exceptions  Communication   Communication: Expressive difficulties (dysarthria)  Cognition Arousal/Alertness: Awake/alert Behavior During Therapy: Flat affect Overall Cognitive Status: Within Functional Limits for tasks assessed                      General Comments      Exercises  Assessment/Plan    PT Assessment Patient needs continued PT services  PT Diagnosis Abnormality of gait   PT Problem List Decreased strength;Decreased activity tolerance;Decreased mobility;Decreased knowledge of use of DME;Decreased coordination;Decreased balance  PT Treatment Interventions Gait training;DME instruction;Functional mobility training;Therapeutic activities;Therapeutic exercise;Patient/family education;Balance training;Neuromuscular re-education;Stair training   PT Goals (Current goals can be found in the Care Plan section) Acute Rehab PT Goals PT Goal  Formulation: With patient Time For Goal Achievement: 12/01/14 Potential to Achieve Goals: Good    Frequency Min 3X/week   Barriers to discharge        Co-evaluation               End of Session Equipment Utilized During Treatment: Gait belt Activity Tolerance: Patient tolerated treatment well Patient left: in bed;with call bell/phone within reach;with bed alarm set           Time: 7591-6384 PT Time Calculation (min) (ACUTE ONLY): 13 min   Charges:   PT Evaluation $Initial PT Evaluation Tier I: 1 Procedure PT Treatments $Therapeutic Activity: 8-22 mins   PT G Codes:          Lyniah Fujita,KATHrine E 11/24/2014, 12:07 PM Carmelia Bake, PT, DPT 11/24/2014 Pager: [phone]

## 2014-11-25 DIAGNOSIS — G20A1 Parkinson's disease without dyskinesia, without mention of fluctuations: Secondary | ICD-10-CM

## 2014-11-25 DIAGNOSIS — R262 Difficulty in walking, not elsewhere classified: Secondary | ICD-10-CM | POA: Insufficient documentation

## 2014-11-25 DIAGNOSIS — E538 Deficiency of other specified B group vitamins: Secondary | ICD-10-CM

## 2014-11-25 DIAGNOSIS — G20 Parkinson's disease: Secondary | ICD-10-CM

## 2014-11-25 LAB — CBC
HEMATOCRIT: 41.9 % (ref 39.0–52.0)
HEMOGLOBIN: 14.1 g/dL (ref 13.0–17.0)
MCH: 31.4 pg (ref 26.0–34.0)
MCHC: 33.7 g/dL (ref 30.0–36.0)
MCV: 93.3 fL (ref 78.0–100.0)
Platelets: 234 10*3/uL (ref 150–400)
RBC: 4.49 MIL/uL (ref 4.22–5.81)
RDW: 13 % (ref 11.5–15.5)
WBC: 6.8 10*3/uL (ref 4.0–10.5)

## 2014-11-25 LAB — COMPREHENSIVE METABOLIC PANEL
ALK PHOS: 83 U/L (ref 39–117)
ALT: 5 U/L (ref 0–53)
ANION GAP: 3 — AB (ref 5–15)
AST: 16 U/L (ref 0–37)
Albumin: 3.8 g/dL (ref 3.5–5.2)
BILIRUBIN TOTAL: 0.7 mg/dL (ref 0.3–1.2)
BUN: 11 mg/dL (ref 6–23)
CO2: 30 mmol/L (ref 19–32)
Calcium: 8.5 mg/dL (ref 8.4–10.5)
Chloride: 107 mEq/L (ref 96–112)
Creatinine, Ser: 0.86 mg/dL (ref 0.50–1.35)
GFR, EST NON AFRICAN AMERICAN: 86 mL/min — AB (ref >90)
GLUCOSE: 104 mg/dL — AB (ref 70–99)
POTASSIUM: 3.8 mmol/L (ref 3.5–5.1)
SODIUM: 140 mmol/L (ref 135–145)
Total Protein: 6.2 g/dL (ref 6.0–8.3)

## 2014-11-25 MED ORDER — CARBIDOPA-LEVODOPA 25-100 MG PO TABS
1.0000 | ORAL_TABLET | Freq: Three times a day (TID) | ORAL | Status: DC
  Filled 2014-11-25 (×2): qty 1

## 2014-11-25 MED ORDER — CARBIDOPA-LEVODOPA 25-100 MG PO TABS: 1.0000 | ORAL_TABLET | Freq: Three times a day (TID) | ORAL | Status: DC

## 2014-11-25 MED ORDER — AMLODIPINE BESYLATE 10 MG PO TABS: 10.0000 mg | ORAL_TABLET | Freq: Every day | ORAL | Status: DC

## 2014-11-25 NOTE — Discharge Summary (Addendum)
Physician Discharge Summary  [name] MRN: [id number] DOB/AGE: Jan 02, 1946 68 y.o.  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     ALPRAZolam 0.5 MG tablet  Commonly known as:  XANAX  TAKE 1 TABLET BY MOUTH TWICE DAILY AS NEEDED     amLODipine 5 MG tablet  Commonly known as:  NORVASC  Take 5 mg by mouth daily.     amLODipine 10 MG tablet  Commonly known as:  NORVASC  Take 1 tablet (10 mg total) by mouth daily.     atorvastatin 10 MG tablet  Commonly known as:  LIPITOR     carbidopa-levodopa 25-100 MG per tablet  Commonly known as:  SINEMET IR  Take 1 tablet by mouth 3 (three) times daily.     cyanocobalamin 1000 MCG tablet  Take 1 tablet (1,000 mcg total) by mouth daily.     DULCOLAX 5 MG EC tablet  Generic drug:  bisacodyl  Take 15 mg by mouth daily as needed (For constipation.).     Fish Oil 1200 MG Caps  Take 2,400 mg by mouth daily.     Flax Seed Oil 1000 MG Caps  Take 1,000 mg by mouth 2 (two) times daily.     FLAXSEED OIL PO  Take 2 capsules by mouth daily.     omega-3 acid ethyl esters 1 G capsule  Commonly known as:  LOVAZA  Take 3 g by mouth daily.     omeprazole 20 MG capsule  Commonly known as:  PRILOSEC  Take 1 capsule (20 mg total) by mouth daily.     ondansetron 4 MG tablet  Commonly known as:  ZOFRAN  Take 1 tablet (4 mg total) by mouth every 8 (eight) hours as needed for nausea or vomiting.     ondansetron 4 MG tablet  Commonly  known as:  ZOFRAN  TAKE 1 TABLET BY MOUTH EVERY 8 HOURS AS NEEDED FOR NAUSEA AND VOMITING     polyethylene glycol powder powder  Commonly known as:  MIRALAX  Take 17 g by mouth daily.     tamsulosin 0.4 MG Caps capsule  Commonly known as:  FLOMAX  Take 1 capsule (0.4 mg total) by mouth daily after supper.     TOVIAZ 8 MG Tb24 tablet  Generic drug:  fesoterodine  Take 8 mg by mouth daily.        Discharge Condition:   Disposition: 01-Home or Self Care   Consults: Neurology   Significant Diagnostic Studies: Dg Chest 2 View  11/23/2014   CLINICAL DATA:  Weakness, worsening over the last few days.  EXAM: CHEST  2 VIEW  COMPARISON:  08/16/2014  FINDINGS: Mild tortuosity and atherosclerosis of the thoracic aorta. Thoracic spondylosis.  The lungs appear clear.  No pleural effusion.  Heart size normal.  IMPRESSION: 1. Chronic atherosclerosis and tortuosity of the  thoracic aorta. No acute findings.   Electronically Signed   By: Sherryl Barters M.D.   On: 11/23/2014 11:46   Ct Head Wo Contrast  11/23/2014   CLINICAL DATA:  Unsteady gait, difficulty walking  EXAM: CT HEAD WITHOUT CONTRAST  TECHNIQUE: Contiguous axial images were obtained from the base of the skull through the vertex without intravenous contrast.  COMPARISON:  Head CT 08/15/2014  FINDINGS: No acute intracranial hemorrhage. No focal mass lesion. No CT evidence of acute infarction. No midline shift or mass effect. No hydrocephalus. Basilar cisterns are patent.  There is generalized cortical atrophy similar to prior. Mild periventricular white matter hypodensities.  Paranasal sinuses and  mastoid air cells are clear.  IMPRESSION: 1. No acute intracranial findings. 2. Atrophy and white matter microvascular disease.   Electronically Signed   By: Suzy Bouchard M.D.   On: 11/23/2014 14:02   Mr Brain Wo Contrast  11/23/2014   CLINICAL DATA:  68 year old hypertensive male with hyperlipidemia presenting with confusion and  unsteady gait with difficulty walking. Initial encounter.  EXAM: MRI HEAD WITHOUT CONTRAST  TECHNIQUE: Multiplanar, multiecho pulse sequences of the brain and surrounding structures were obtained without intravenous contrast.  COMPARISON:  11/23/2014 CT.  08/07/2012 MR.  FINDINGS: Exam is motion degraded.  No acute infarct.  No intracranial hemorrhage.  Mild periventricular white matter type changes suggestive of result of small vessel disease.  Global atrophy without hydrocephalus.  No intracranial mass lesion noted on this unenhanced exam.  Major intracranial vascular structures are patent and ectatic.  Partially empty sella felt to be an incidental finding.  Cervical medullary junction, pineal region and orbital structures are unremarkable.  Nonspecific right parietal small scalp lesion.  IMPRESSION: Exam is motion degraded.  No acute infarct.  Mild periventricular white matter type changes suggestive of result of small vessel disease.  Global atrophy without hydrocephalus.  No intracranial mass lesion noted on this unenhanced exam.  Nonspecific right parietal small scalp lesion.   Electronically Signed   By: Chauncey Cruel M.D.   On: 11/23/2014 17:23      Microbiology: No results found for this or any previous visit (from the past 240 hour(s)).   Labs: Results for orders placed or performed during the hospital encounter of 11/23/14 (from the past 48 hour(s))  CBC with Differential     Status: None   Collection Time: 11/23/14 11:42 AM  Result Value Ref Range   WBC 7.5 4.0 - 10.5 K/uL   RBC 4.78 4.22 - 5.81 MIL/uL   Hemoglobin 15.4 13.0 - 17.0 g/dL   HCT 43.8 39.0 - 52.0 %   MCV 91.6 78.0 - 100.0 fL   MCH 32.2 26.0 - 34.0 pg   MCHC 35.2 30.0 - 36.0 g/dL   RDW 13.0 11.5 - 15.5 %   Platelets 228 150 - 400 K/uL   Neutrophils Relative % 68 43 - 77 %   Neutro Abs 5.1 1.7 - 7.7 K/uL   Lymphocytes Relative 22 12 - 46 %   Lymphs Abs 1.6 0.7 - 4.0 K/uL   Monocytes Relative 8 3 - 12 %   Monocytes  Absolute 0.6 0.1 - 1.0 K/uL   Eosinophils Relative 2 0 - 5 %   Eosinophils Absolute 0.1 0.0 - 0.7 K/uL   Basophils Relative 0 0 - 1 %   Basophils Absolute 0.0 0.0 - 0.1 K/uL  Comprehensive metabolic panel     Status: Abnormal   Collection Time: 11/23/14 11:42 AM  Result Value  Ref Range   Sodium 141 137 - 147 mEq/L   Potassium 4.0 3.7 - 5.3 mEq/L   Chloride 103 96 - 112 mEq/L   CO2 23 19 - 32 mEq/L   Glucose, Bld 101 (H) 70 - 99 mg/dL   BUN 11 6 - 23 mg/dL   Creatinine, Ser 0.75 0.50 - 1.35 mg/dL   Calcium 9.2 8.4 - 10.5 mg/dL   Total Protein 6.9 6.0 - 8.3 g/dL   Albumin 4.1 3.5 - 5.2 g/dL   AST 20 0 - 37 U/L    Comment: SLIGHT HEMOLYSIS HEMOLYSIS AT THIS LEVEL MAY AFFECT RESULT    ALT 22 0 - 53 U/L   Alkaline Phosphatase 99 39 - 117 U/L   Total Bilirubin 0.5 0.3 - 1.2 mg/dL   GFR calc non Af Amer >90 >90 mL/min   GFR calc Af Amer >90 >90 mL/min    Comment: (NOTE) The eGFR has been calculated using the CKD EPI equation. This calculation has not been validated in all clinical situations. eGFR's persistently <90 mL/min signify possible Chronic Kidney Disease.    Anion gap 15 5 - 15  I-Stat CG4 Lactic Acid, ED     Status: None   Collection Time: 11/23/14 11:51 AM  Result Value Ref Range   Lactic Acid, Venous 0.96 0.5 - 2.2 mmol/L  Urinalysis, Routine w reflex microscopic     Status: Abnormal   Collection Time: 11/23/14 11:55 AM  Result Value Ref Range   Color, Urine YELLOW YELLOW   APPearance CLEAR CLEAR   Specific Gravity, Urine 1.004 (L) 1.005 - 1.030   pH 7.0 5.0 - 8.0   Glucose, UA NEGATIVE NEGATIVE mg/dL   Hgb urine dipstick NEGATIVE NEGATIVE   Bilirubin Urine NEGATIVE NEGATIVE   Ketones, ur NEGATIVE NEGATIVE mg/dL   Protein, ur NEGATIVE NEGATIVE mg/dL   Urobilinogen, UA 0.2 0.0 - 1.0 mg/dL   Nitrite NEGATIVE NEGATIVE   Leukocytes, UA TRACE (A) NEGATIVE  Urine microscopic-add on     Status: None   Collection Time: 11/23/14 11:55 AM  Result Value Ref Range    Squamous Epithelial / LPF RARE RARE   WBC, UA 0-2 <3 WBC/hpf  Drug screen panel, emergency     Status: Abnormal   Collection Time: 11/23/14 11:55 AM  Result Value Ref Range   Opiates NONE DETECTED NONE DETECTED   Cocaine NONE DETECTED NONE DETECTED   Benzodiazepines POSITIVE (A) NONE DETECTED   Amphetamines NONE DETECTED NONE DETECTED   Tetrahydrocannabinol NONE DETECTED NONE DETECTED   Barbiturates NONE DETECTED NONE DETECTED    Comment:        DRUG SCREEN FOR MEDICAL PURPOSES ONLY.  IF CONFIRMATION IS NEEDED FOR ANY PURPOSE, NOTIFY LAB WITHIN 5 DAYS.        LOWEST DETECTABLE LIMITS FOR URINE DRUG SCREEN Drug Class       Cutoff (ng/mL) Amphetamine      1000 Barbiturate      200 Benzodiazepine   919 Tricyclics       166 Opiates          300 Cocaine          300 THC              50   Hepatic function panel     Status: None   Collection Time: 11/23/14  9:15 PM  Result Value Ref Range   Total Protein 6.3 6.0 - 8.3 g/dL   Albumin 3.6 3.5 - 5.2 g/dL  AST 17 0 - 37 U/L   ALT 19 0 - 53 U/L   Alkaline Phosphatase 86 39 - 117 U/L   Total Bilirubin 0.7 0.3 - 1.2 mg/dL   Bilirubin, Direct <0.2 0.0 - 0.3 mg/dL   Indirect Bilirubin NOT CALCULATED 0.3 - 0.9 mg/dL  Magnesium     Status: None   Collection Time: 11/23/14  9:15 PM  Result Value Ref Range   Magnesium 2.0 1.5 - 2.5 mg/dL  TSH     Status: None   Collection Time: 11/23/14  9:15 PM  Result Value Ref Range   TSH 1.273 0.350 - 4.500 uIU/mL    Comment: Performed at [facility]  Troponin I     Status: None   Collection Time: 11/23/14  9:15 PM  Result Value Ref Range   Troponin I <0.30 <0.30 ng/mL    Comment:        Due to the release kinetics of cTnI, a negative result within the first hours of the onset of symptoms does not rule out myocardial infarction with certainty. If myocardial infarction is still suspected, repeat the test at appropriate intervals.   CK     Status: None   Collection Time: 11/23/14   9:15 PM  Result Value Ref Range   Total CK 103 7 - 232 U/L  Troponin I     Status: None   Collection Time: 11/24/14  3:05 AM  Result Value Ref Range   Troponin I <0.03 <0.031 ng/mL    Comment:        NO INDICATION OF MYOCARDIAL INJURY. Please note change in reference range.   CBC     Status: None   Collection Time: 11/24/14  3:05 AM  Result Value Ref Range   WBC 8.5 4.0 - 10.5 K/uL   RBC 4.84 4.22 - 5.81 MIL/uL   Hemoglobin 15.2 13.0 - 17.0 g/dL   HCT 44.7 39.0 - 52.0 %   MCV 92.4 78.0 - 100.0 fL   MCH 31.4 26.0 - 34.0 pg   MCHC 34.0 30.0 - 36.0 g/dL   RDW 13.0 11.5 - 15.5 %   Platelets 241 150 - 400 K/uL  Troponin I     Status: None   Collection Time: 11/24/14  8:30 AM  Result Value Ref Range   Troponin I <0.03 <0.031 ng/mL    Comment:        NO INDICATION OF MYOCARDIAL INJURY. Please note change in reference range.   Comprehensive metabolic panel     Status: Abnormal   Collection Time: 11/24/14  8:30 AM  Result Value Ref Range   Sodium 140 135 - 145 mmol/L    Comment: Please note change in reference range.   Potassium 3.2 (L) 3.5 - 5.1 mmol/L    Comment: Please note change in reference range. REPEATED TO VERIFY DELTA CHECK NOTED    Chloride 106 96 - 112 mEq/L   CO2 25 19 - 32 mmol/L   Glucose, Bld 96 70 - 99 mg/dL   BUN 11 6 - 23 mg/dL   Creatinine, Ser 0.72 0.50 - 1.35 mg/dL   Calcium 8.6 8.4 - 10.5 mg/dL   Total Protein 6.9 6.0 - 8.3 g/dL   Albumin 4.3 3.5 - 5.2 g/dL   AST 17 0 - 37 U/L   ALT 18 0 - 53 U/L   Alkaline Phosphatase 85 39 - 117 U/L   Total Bilirubin 1.3 (H) 0.3 - 1.2 mg/dL   GFR calc non  Af Amer >90 >90 mL/min   GFR calc Af Amer >90 >90 mL/min    Comment: (NOTE) The eGFR has been calculated using the CKD EPI equation. This calculation has not been validated in all clinical situations. eGFR's persistently <90 mL/min signify possible Chronic Kidney Disease.    Anion gap 9 5 - 15  CBC     Status: None   Collection Time: 11/25/14  4:37 AM   Result Value Ref Range   WBC 6.8 4.0 - 10.5 K/uL   RBC 4.49 4.22 - 5.81 MIL/uL   Hemoglobin 14.1 13.0 - 17.0 g/dL   HCT 41.9 39.0 - 52.0 %   MCV 93.3 78.0 - 100.0 fL   MCH 31.4 26.0 - 34.0 pg   MCHC 33.7 30.0 - 36.0 g/dL   RDW 13.0 11.5 - 15.5 %   Platelets 234 150 - 400 K/uL  Comprehensive metabolic panel     Status: Abnormal   Collection Time: 11/25/14  4:37 AM  Result Value Ref Range   Sodium 140 135 - 145 mmol/L    Comment: Please note change in reference range.   Potassium 3.8 3.5 - 5.1 mmol/L    Comment: Please note change in reference range.   Chloride 107 96 - 112 mEq/L   CO2 30 19 - 32 mmol/L   Glucose, Bld 104 (H) 70 - 99 mg/dL   BUN 11 6 - 23 mg/dL   Creatinine, Ser 0.86 0.50 - 1.35 mg/dL   Calcium 8.5 8.4 - 10.5 mg/dL   Total Protein 6.2 6.0 - 8.3 g/dL   Albumin 3.8 3.5 - 5.2 g/dL   AST 16 0 - 37 U/L   ALT 5 0 - 53 U/L   Alkaline Phosphatase 83 39 - 117 U/L   Total Bilirubin 0.7 0.3 - 1.2 mg/dL   GFR calc non Af Amer 86 (L) >90 mL/min   GFR calc Af Amer >90 >90 mL/min    Comment: (NOTE) The eGFR has been calculated using the CKD EPI equation. This calculation has not been validated in all clinical situations. eGFR's persistently <90 mL/min signify possible Chronic Kidney Disease.    Anion gap 3 (L) 5 - 15     HPI :[name] is an 68 y.o. male with a history of hyperlipidemia, vitamin B 12 deficiency, stroke in 2011, hypertension schizophrenia and history of dementia, brought to the emergency room by his wife because of progressive gait deterioration over 2 months. He's been unstable to the point of being unable to ambulate without support for the last 2-3 days. His ace of walking has become very slow and wife describes his gait is shuffling with small steps. He's had increasing difficulty standing from a seated position. He's also has a tendency to fall backwards if he is not supported when standing or walking. He said no focal deficits. CT scan of his  head today as well as MRI showed no acute intracranial abnormality  HOSPITAL COURSE:  Altered mental status secondary to Parkinson's disease No findings of a CVA or hydrocephalus Update patient's home medication list Neurology suspects Parkinson disease and started the patient on Sinemet to which she responded well TSH within normal limits Vitamin B-12 level pending PT/OT/evaluation recommend home health Neurology recommended, one tablet of Sinemet 3 times a day, Follow-up with neurology in 2-3 weeks   bipolar disorder, dementia, depression Review of patient's medications Total CK is normal Continue Xanax  Hypertension-improving Continue Norvasc   BPH Continue Flomax  History of  CVA Patient has discontinued aspirin because of petechiae according to the patient's wife   Code Status: full     Discharge Exam: Blood pressure 145/93, pulse 71, temperature 98.4 F (36.9 C), temperature source Oral, resp. rate 16, height 6' (1.829 m), weight 86.183 kg (190 lb), SpO2 97 %.  Cardiovascular: RRR, S1 normal, S2 normal, no MRG, pulses symmetric and intact bilaterally  Pulmonary/Chest: CTAB, no wheezes, rales, or rhonchi  Abdominal: Soft. Non-tender, non-distended, bowel sounds are normal, no masses, organomegaly, or guarding present.  GU: no CVA tenderness Musculoskeletal: No joint deformities, erythema, or stiffness, ROM full and no nontender Ext: no edema and no cyanosis, pulses palpable bilaterally (DP and PT)  Hematology: no cervical, inginal, or axillary adenopathy.  Neurological: Neurological: He is alert and oriented to person, place, and time.  Strength is 4 out of 5-5 extremities, distal sensation is grossly intact, pupils are equal round reactive to light, uvula is midline. Finger to nose and heel to shin with no abnormalities. Patient cannot ambulate without assistance, he takes short shuffling steps. He does have a tendency to fall backwards and needs assistance  in order not to fall. Skin: Warm, dry and intact. No rash, cyanosis, or clubbing.  Psychiatric: Normal mood and affect. speech and behavior is normal. Judgment and thought content normal. Cognition and memory are normal.       Discharge Instructions    Diet - low sodium heart healthy    Complete by:  As directed      Increase activity slowly    Complete by:  As directed            Follow-up Information    Follow up with Dennis Cower, MD.   Specialties:  Internal Medicine, Radiology   Contact information:   [address] [place] [address] [phone]       Follow up with [facility] neurology. Schedule an appointment as soon as possible for a visit in 2 weeks.   Contact information:   [facility] Address: [address], [place], [place] [address] [phone] Hours: Open today  8:00 AM - 5:00 PM       Signed: Reyne Dumas 11/25/2014, 11:17 AM

## 2014-11-25 NOTE — Progress Notes (Signed)
Subjective: Patient had no new complaints. Gait and posture have improved per physical therapy to mentation. He is tolerating Sinemet 25-100 one half tablet 3 times a day without side effects. His wife thinks his speech is also improved with less dysarthria.  Objective: Current vital signs: BP 145/93 mmHg  Pulse 71  Temp(Src) 98.4 F (36.9 C) (Oral)  Resp 16  Ht 6' (1.829 m)  Wt 86.183 kg (190 lb)  BMI 25.76 kg/m2  SpO2 97%  Neurologic Exam: Patient was alert and in no acute distress. He was well-oriented to time as well as person and place. Speech was slightly dysarthric though is unremarkable. No tremor was noted. Mild to moderate increase in muscle tone was noted, although less than on initial examination. No cogwheel rigidity noted at the wrist. Strength was normal throughout.  Medications: I have reviewed the patient's current medications.  Assessment/Plan: 68 year old man with history of vitamin B 12 deficiency, untreated, presenting with new onset Parkinson's disease with improvement with initial dose of Sinemet 25-100 one half tablet 3 times a day. I will increase Sinemet to one whole tablet 3 times a day. No changes recommended otherwise.  No objection to patient being charged to home with his spouse if physical therapy agrees. He will need outpatient neurology follow-up in 2-3 weeks.  C.R. Nicole Kindred, MD [facility] [phone]  11/25/2014  10:44 AM

## 2014-11-25 NOTE — Progress Notes (Signed)
Occupational Therapy Treatment Patient Details Name: [name] MRN: [id number] DOB: 07-25-1946 Today's Date: 11/25/2014    History of present illness Pt is a 68 year old male with a history of hyperlipidemia, vitamin B 12 deficiency, stroke in 2011, hypertension, schizophrenia, and history of dementia, admitted with progressive gait deterioration over 2 months with neurology suspecting Parkinsons.     OT comments  Pt participated in AD L retraining session today for toileting, LB dressing and functional mobility in room and hallway. He benefits from hand held assist/IV pole for support min guard assist (should benefit from RW for increased support & stability - defer to PT). Discussed recommendation of shower seat with rails and pt/spouse verbalized agreement with this. Will need shower seat w/ rails, 24/7 A and HHOT recommended.   Follow Up Recommendations  Home health OT;Supervision/Assistance - 24 hour    Equipment Recommendations  Tub/shower seat (Rec shower seat with rails)    Recommendations for Other Services      Precautions / Restrictions Precautions Precautions: Fall Restrictions Weight Bearing Restrictions: No       Mobility Bed Mobility Overal bed mobility: Needs Assistance Bed Mobility: Sidelying to Sit;Supine to Sit   Sidelying to sit: Supervision Supine to sit: Supervision        Transfers Overall transfer level: Needs assistance Equipment used: None (Pt used IV pole and hand held assist x1) Transfers: Sit to/from [facility] Sit to Stand: Min guard Stand pivot transfers: Min guard            Balance Overall balance assessment: Needs assistance Sitting-balance support: Bilateral upper extremity supported;Feet supported Sitting balance-Leahy Scale: Good     Standing balance support: Single extremity supported;During functional activity;Bilateral upper extremity supported Standing balance-Leahy Scale: Fair                      ADL Overall ADL's : Needs assistance/impaired     Grooming: Wash/dry hands;Wash/dry face;Set up;Sitting       Lower Body Bathing: Sit to/from stand;Minimal assistance Lower Body Bathing Details (indicate cue type and reason): Pt assisted w/ bathing LE's sitting on toilet, Min A overall for feet.     Lower Body Dressing: Min guard;Sit to/from stand (Min guard to don underwear over feet, Min guard assist in standing while pt donned)   Toilet Transfer: Min guard;Ambulation;Comfort height toilet;Grab bars (Pt and wife report that he appears near baseline for functional mobility and transfers in bathroom and on/off toilet)   Toileting- Clothing Manipulation and Hygiene: Min guard;Sit to/from stand       Functional mobility during ADLs: Min guard (Pt is Min guard A for functional mobility holding onto IV pole, ?RW for  balance and increaesd support - defer to PT) General ADL Comments: Pt participated in ADL retraining session today, he was supervision bed mobility and Min guard assist for functional mobility and transfers, he benefits from vc's to slow down/not rush and for gait/step pattern. Pt amb w/ IV pole in and out ofroom and should benefit from use of RW (defer to PT) for increased stability. Pt/wife educated on shower chair w/ arms and were agreeable to this, handout provided.      Vision                     Perception     Praxis      Cognition   Behavior During Therapy: Flat affect Overall Cognitive Status: History of cognitive impairments - at baseline  Memory: Decreased short-term memory               Extremity/Trunk Assessment               Exercises     Shoulder Instructions       General Comments      Pertinent Vitals/ Pain       Pain Assessment: 0-10 Pain Score: 5  Pain Location: headache Pain Descriptors / Indicators: Aching Pain Intervention(s): Other (comment);Monitored during session (Wife reports pt has pain  medicine)  Home Living                                          Prior Functioning/Environment              Frequency Min 2X/week     Progress Toward Goals  OT Goals(current goals can now be found in the care plan section)  Progress towards OT goals: Progressing toward goals  Acute Rehab OT Goals Patient Stated Goal: go home  Plan Discharge plan remains appropriate    Co-evaluation                 End of Session Equipment Utilized During Treatment: Gait belt;Other (comment) (IV pole and 1 hand held assist)   Activity Tolerance Patient tolerated treatment well   Patient Left in chair;with call bell/phone within reach;with family/visitor present;with nursing/sitter in room   Nurse Communication          Time: 1735-6701 OT Time Calculation (min): 23 min  Charges: OT General Charges $OT Visit: 1 Procedure OT Treatments $Self Care/Home Management : 23-37 mins (23 min)  Anders Hohmann Beth Dixon, OT 11/25/2014, 10:45 AM

## 2014-11-25 NOTE — Progress Notes (Signed)
Spoke with pt and wife at bedside concerning [place]. Wife had no preference, explained referral would be given to [facility]. Wife agreed. Referral called to in house rep.

## 2014-11-26 ENCOUNTER — Telehealth: Payer: Self-pay | Admitting: *Deleted

## 2014-11-26 NOTE — Telephone Encounter (Signed)
Called pt to set up TCM appt no answer LMOM RTC.../lmb 

## 2014-11-26 NOTE — Telephone Encounter (Signed)
Wife called back completed TCM call.../lmb  Transition Care Management Follow-up Telephone Call D/C 11/25/14  How have you been since you were released from the hospital? Wife return call back she stated he is ok not a big improvement   Do you understand why you were in the hospital? Wife states yes they understood why he was admitted   Do you understand the discharge instrcutions? Yes we reviewed together  Items Reviewed:  Medications reviewed: YES  Allergies reviewed: YES  Dietary changes reviewed: NO  Referrals reviewed: No referrals needed   Functional Questionnaire:   Activities of Daily Living (ADLs):   She states he is independent in the following: ambulation, bathing and hygiene, feeding, continence, toileting and dressing She states he doesn't require assistance he is able to walk but they do watch him   Any transportation issues/concerns?: NO   Any patient concerns? NO   Confirmed importance and date/time of follow-up visits scheduled: YES, made appt for 12/03/14 with Dr. Jenny Reichmann   Confirmed with patient if condition begins to worsen call PCP or go to the ER.

## 2014-12-03 ENCOUNTER — Inpatient Hospital Stay: Payer: Medicare Other | Admitting: Internal Medicine

## 2014-12-08 ENCOUNTER — Encounter: Payer: Self-pay | Admitting: Internal Medicine

## 2014-12-08 LAB — VITAMIN B12: VITAMIN B 12: 355 pg/mL (ref 211–911)

## 2014-12-10 ENCOUNTER — Telehealth: Payer: Self-pay | Admitting: Internal Medicine

## 2014-12-10 NOTE — Telephone Encounter (Signed)
OV scheduled for 12/15/14, pt aware.

## 2014-12-10 NOTE — Telephone Encounter (Signed)
Heather/[facility] called to say.Marland KitchenMarland KitchenMarland KitchenMarland Kitchen                      BP elevated today before NP taken 172/102, heart rate 78. Weight 198. Right eye bloodshot today denied any vision changes. Right side back swelling on skin, wife squeezed and got tons of pus out of (pinkish-yellow) feels softer but still feels a cording inside.     [id number]

## 2014-12-10 NOTE — Telephone Encounter (Signed)
Please consider OV, as pt was recommended for f/u with me at 5-7 days post last hosp d/c on dec 23

## 2014-12-15 ENCOUNTER — Ambulatory Visit (INDEPENDENT_AMBULATORY_CARE_PROVIDER_SITE_OTHER): Payer: Medicare Other | Admitting: Internal Medicine

## 2014-12-15 ENCOUNTER — Encounter: Payer: Self-pay | Admitting: Internal Medicine

## 2014-12-15 VITALS — BP 148/90 | HR 77 | Temp 97.7°F | Wt 199.0 lb

## 2014-12-15 DIAGNOSIS — R0689 Other abnormalities of breathing: Secondary | ICD-10-CM

## 2014-12-15 DIAGNOSIS — R0989 Other specified symptoms and signs involving the circulatory and respiratory systems: Secondary | ICD-10-CM

## 2014-12-15 DIAGNOSIS — I10 Essential (primary) hypertension: Secondary | ICD-10-CM

## 2014-12-15 DIAGNOSIS — R350 Frequency of micturition: Secondary | ICD-10-CM

## 2014-12-15 LAB — POCT URINALYSIS DIPSTICK
Bilirubin, UA: NEGATIVE
Blood, UA: NEGATIVE
Glucose, UA: NEGATIVE
Ketones, UA: NEGATIVE
Nitrite, UA: NEGATIVE
PH UA: 5
Protein, UA: NEGATIVE
Spec Grav, UA: <=1.005
Urobilinogen, UA: 0.2

## 2014-12-15 LAB — HEMOGLOBIN A1C
Hgb A1c MFr Bld: 5.7 %
MEAN PLASMA GLUCOSE: 117 mg/dL

## 2014-12-15 MED ORDER — ALPRAZOLAM 0.5 MG PO TABS: 0.5000 mg | ORAL_TABLET | Freq: Three times a day (TID) | ORAL | Status: DC | PRN

## 2014-12-15 MED ORDER — LEVOFLOXACIN 250 MG PO TABS: 250.0000 mg | ORAL_TABLET | Freq: Every day | ORAL | Status: DC

## 2014-12-15 MED ORDER — CEFTRIAXONE SODIUM 1 G IJ SOLR
1.0000 g | Freq: Once | INTRAMUSCULAR | Status: AC
  Administered 2014-12-15: 1 g via INTRAMUSCULAR

## 2014-12-15 MED ORDER — MIRTAZAPINE 15 MG PO TABS: 15.0000 mg | ORAL_TABLET | Freq: Every day | ORAL | Status: DC

## 2014-12-15 MED ORDER — ZOLPIDEM TARTRATE 5 MG PO TABS: 5.0000 mg | ORAL_TABLET | Freq: Every evening | ORAL | Status: DC | PRN

## 2014-12-15 NOTE — Patient Instructions (Addendum)
You had the rocephin antiobiotic shot today  Please take all new medication as prescribed - the antibiotic, remeron and ambien for sleep  Please continue all other medications as before, and refills have been done if requested - the xanax  Please have the pharmacy call with any other refills you may need.  Please keep your appointments with your specialists as you may have planned  Your specimen will be sent for culture  Please go to the XRAY Department in the Basement (go straight as you get off the elevator) for the x-ray testing  You will be contacted by phone if any changes need to be made immediately.  Otherwise, you will receive a letter about your results with an explanation, but please check with MyChart first.  Please remember to sign up for MyChart if you have not done so, as this will be important to you in the future with finding out test results, communicating by private email, and scheduling acute appointments online when needed.

## 2014-12-15 NOTE — Progress Notes (Signed)
Pre visit review using our clinic review tool, if applicable. No additional management support is needed unless otherwise documented below in the visit note. 

## 2014-12-15 NOTE — Assessment & Plan Note (Addendum)
?   Recurrent UTI - for UA/cx - levaquin empiric, after rocephin IM today

## 2014-12-15 NOTE — Progress Notes (Signed)
Subjective:    Patient ID: [name], male    DOB: 28-Jan-1946, 69 y.o.   MRN: [id number]  HPI here with wife, recently dx parkinsons now on sinamet.  Wife Has help with [facility] and OT as well, but needs appetite med, has marked anxiety, ? To incr xanax.  Up most nights, just wide open at night. Wife frazzled by his behavior at night. Ambien has helped in past.  Hax hx of UTI - Denies urinary symptoms such as dysuria,  urgency, flank pain, hematuria or n/v, fever, chills., but has had increased freq recently at almost hourly per pt.  Feels warm today. Pt denies chest pain, increased sob or doe, wheezing, orthopnea, PND, increased LE swelling, palpitations, dizziness or syncope. No cough or ST Past Surgical History  Procedure Laterality Date  . Transanal excision anal and rectal polyps  11-02-2000  . Transthoracic echocardiogram  02-03-2012    GRADE I DIASTOLIC DYSFUNCTION/  EF 60-65%  . Cardiovascular stress test  06-18-2013  DR Jenkins [place]    LOW RISK NO EXERCISE LEXISCAN STUDY/  NO ISCHEMIA, CANNOT RULE OUT BASAL INFERIOR INFARCTION BUT POSSIBLE ARTIFACT GIVEN NORMAL WALL MOTION/  EF 73%  . Excision tumor upper left thigh  2012  . Transurethral incision of prostate N/A 10/03/2013    Procedure: TRANSURETHRAL INCISION OF THE PROSTATE (TUIP);  Surgeon: Claybon Jabs, MD;  Location: [facility];  Service: Urology;  Laterality: N/A;  . Cystoscopy with litholapaxy N/A 10/03/2013    Procedure: CYSTOSCOPY WITH LITHOLAPAXY;  Surgeon: Claybon Jabs, MD;  Location: [facility];  Service: Urology;  Laterality: N/A;  . Holmium laser application N/A 41/63/8453    Procedure: HOLMIUM LASER APPLICATION;  Surgeon: Claybon Jabs, MD;  Location: [facility];  Service: Urology;  Laterality: N/A;    reports that he has been smoking Cigarettes.  He has a 24 pack-year smoking history. He has never used smokeless tobacco. He reports that he does not drink alcohol or  use illicit drugs. family history includes Dementia in his father and mother. There is no history of Colon cancer, Colon polyps, Rectal cancer, or Stomach cancer. No Known Allergies Current Outpatient Prescriptions on File Prior to Visit  Medication Sig Dispense Refill  . ALPRAZolam (XANAX) 0.5 MG tablet Take 0.5 mg by mouth every 8 (eight) hours.    Marland Kitchen amLODipine (NORVASC) 10 MG tablet Take 1 tablet (10 mg total) by mouth daily. 30 tablet 3  . atorvastatin (LIPITOR) 10 MG tablet     . bisacodyl (DULCOLAX) 5 MG EC tablet Take 15 mg by mouth daily as needed (For constipation.).     Marland Kitchen carbidopa-levodopa (SINEMET IR) 25-100 MG per tablet Take 1 tablet by mouth 3 (three) times daily. 90 tablet 2  . fesoterodine (TOVIAZ) 8 MG TB24 tablet Take 8 mg by mouth daily.    . Flaxseed, Linseed, (FLAXSEED OIL PO) Take 2 capsules by mouth daily.    Marland Kitchen omega-3 acid ethyl esters (LOVAZA) 1 G capsule Take 3 g by mouth daily.    . Omega-3 Fatty Acids (FISH OIL) 1200 MG CAPS Take 2,400 mg by mouth daily.    Marland Kitchen omeprazole (PRILOSEC) 20 MG capsule Take 1 capsule (20 mg total) by mouth daily. (Patient taking differently: Take 20 mg by mouth daily as needed (For heartburn or acid reflux.). ) 30 capsule 11  . ondansetron (ZOFRAN) 4 MG tablet Take 1 tablet (4 mg total) by mouth every 8 (eight)  hours as needed for nausea or vomiting. 90 tablet 1  . polyethylene glycol powder (MIRALAX) powder Take 17 g by mouth daily. 527 g 0  . tamsulosin (FLOMAX) 0.4 MG CAPS capsule Take 1 capsule (0.4 mg total) by mouth daily after supper. (Patient taking differently: Take 0.4 mg by mouth daily as needed (Takes when he can't empty bladder). ) 30 capsule 0  . vitamin B-12 1000 MCG tablet Take 1 tablet (1,000 mcg total) by mouth daily. 30 tablet 0   No current facility-administered medications on file prior to visit.   Review of Systems  Constitutional: Negative for unusual diaphoresis or other sweats  HENT: Negative for ringing in  ear Eyes: Negative for double vision or worsening visual disturbance.  Respiratory: Negative for choking and stridor.   Gastrointestinal: Negative for vomiting or other signifcant bowel change Genitourinary: Negative for hematuria or decreased urine volume.  Musculoskeletal: Negative for other MSK pain or swelling Skin: Negative for color change and worsening wound.  Neurological: Negative for tremors and numbness other than noted  Psychiatric/Behavioral: Negative for decreased concentration or agitation other than above       Objective:   Physical Exam BP 148/90 mmHg  Pulse 77  Temp(Src) 97.7 F (36.5 C) (Oral)  Wt 199 lb (90.266 kg)  SpO2 94% VS noted,  Constitutional: Pt appears thinner, feels warm though no temp noted  HENT: Head: NCAT.  Right Ear: External ear normal.  Left Ear: External ear normal.  Eyes: . Pupils are equal, round, and reactive to light. Conjunctivae and EOM are normal Neck: Normal range of motion. Neck supple.  Cardiovascular: Normal rate and regular rhythm.   Pulmonary/Chest: Effort normal and breath sounds without rales or wheezing. except for decrased BS right base Abd:  Soft, NT, ND, + BS, no flank tender Neurological: Pt is alert. Not confused , motor grossly intact Skin: Skin is warm. No rash Psychiatric: Pt behavior is normal. No agitation.      Assessment & Plan:

## 2014-12-15 NOTE — Assessment & Plan Note (Signed)
Also for cxr r/o pna 

## 2014-12-15 NOTE — Assessment & Plan Note (Addendum)
Mild elev, o/w stable overall by history and exam, recent data reviewed with pt, and pt to continue medical treatment as before,  to f/u any worsening symptoms or concerns BP Readings from Last 3 Encounters:  12/15/14 148/90  11/25/14 145/93  08/20/14 152/86   To cont same tx

## 2014-12-15 NOTE — Addendum Note (Signed)
Addended by: Townsend Roger D on: 12/15/2014 06:30 PM   Modules accepted: Orders

## 2014-12-16 ENCOUNTER — Ambulatory Visit (INDEPENDENT_AMBULATORY_CARE_PROVIDER_SITE_OTHER)
Admission: RE | Admit: 2014-12-16 | Discharge: 2014-12-16 | Disposition: A | Discharge status: Home / Self Care | Payer: Medicare Other | Source: Ambulatory Visit | Attending: Internal Medicine | Admitting: Internal Medicine

## 2014-12-16 ENCOUNTER — Other Ambulatory Visit (INDEPENDENT_AMBULATORY_CARE_PROVIDER_SITE_OTHER): Payer: Medicare Other

## 2014-12-16 ENCOUNTER — Encounter: Payer: Self-pay | Admitting: Internal Medicine

## 2014-12-16 DIAGNOSIS — R0989 Other specified symptoms and signs involving the circulatory and respiratory systems: Secondary | ICD-10-CM

## 2014-12-16 DIAGNOSIS — R350 Frequency of micturition: Secondary | ICD-10-CM

## 2014-12-16 LAB — URINALYSIS, ROUTINE W REFLEX MICROSCOPIC
Bilirubin Urine: NEGATIVE
Hgb urine dipstick: NEGATIVE
KETONES UR: NEGATIVE
Leukocytes, UA: NEGATIVE
Nitrite: POSITIVE — AB
PH: 6.5 (ref 5.0–8.0)
SPECIFIC GRAVITY, URINE: 1.02 (ref 1.000–1.030)
TOTAL PROTEIN, URINE-UPE24: NEGATIVE
Urine Glucose: NEGATIVE
Urobilinogen, UA: 0.2 (ref 0.0–1.0)

## 2014-12-17 ENCOUNTER — Telehealth: Payer: Self-pay | Admitting: Internal Medicine

## 2014-12-17 LAB — URINE CULTURE
COLONY COUNT: NO GROWTH
ORGANISM ID, BACTERIA: NO GROWTH

## 2014-12-17 NOTE — Telephone Encounter (Signed)
Informed of results.  

## 2014-12-17 NOTE — Telephone Encounter (Signed)
Pt wife called in and said that they were needing to know the results of the xray

## 2014-12-18 DIAGNOSIS — I10 Essential (primary) hypertension: Secondary | ICD-10-CM | POA: Diagnosis not present

## 2014-12-18 DIAGNOSIS — G20 Parkinson's disease: Secondary | ICD-10-CM | POA: Diagnosis not present

## 2014-12-18 DIAGNOSIS — E538 Deficiency of other specified B group vitamins: Secondary | ICD-10-CM | POA: Diagnosis not present

## 2014-12-28 ENCOUNTER — Other Ambulatory Visit: Payer: Self-pay | Admitting: Internal Medicine

## 2015-02-10 ENCOUNTER — Ambulatory Visit: Payer: Medicare Other | Admitting: Internal Medicine

## 2015-02-16 ENCOUNTER — Ambulatory Visit: Payer: Medicare Other | Admitting: Internal Medicine

## 2015-02-20 ENCOUNTER — Other Ambulatory Visit: Payer: Self-pay | Admitting: Internal Medicine

## 2015-03-10 ENCOUNTER — Ambulatory Visit (INDEPENDENT_AMBULATORY_CARE_PROVIDER_SITE_OTHER): Payer: Medicare Other | Admitting: Internal Medicine

## 2015-03-10 ENCOUNTER — Encounter: Payer: Self-pay | Admitting: Internal Medicine

## 2015-03-10 ENCOUNTER — Other Ambulatory Visit: Payer: Medicare Other

## 2015-03-10 VITALS — BP 142/90 | HR 74 | Temp 98.8°F | Resp 18 | Wt 205.0 lb

## 2015-03-10 DIAGNOSIS — R350 Frequency of micturition: Secondary | ICD-10-CM

## 2015-03-10 DIAGNOSIS — N400 Enlarged prostate without lower urinary tract symptoms: Secondary | ICD-10-CM | POA: Diagnosis not present

## 2015-03-10 DIAGNOSIS — I10 Essential (primary) hypertension: Secondary | ICD-10-CM | POA: Diagnosis not present

## 2015-03-10 LAB — POCT URINALYSIS DIPSTICK
Bilirubin, UA: NEGATIVE
Glucose, UA: NEGATIVE
Ketones, UA: NEGATIVE
LEUKOCYTES UA: NEGATIVE
Nitrite, UA: NEGATIVE
PH UA: 6
Protein, UA: NEGATIVE
RBC UA: NEGATIVE
SPEC GRAV UA: 1.01
UROBILINOGEN UA: 0.2

## 2015-03-10 MED ORDER — CEPHALEXIN 500 MG PO CAPS: 500.0000 mg | ORAL_CAPSULE | Freq: Four times a day (QID) | ORAL | Status: DC

## 2015-03-10 MED ORDER — ALPRAZOLAM 0.5 MG PO TABS: 0.5000 mg | ORAL_TABLET | Freq: Three times a day (TID) | ORAL | Status: DC | PRN

## 2015-03-10 MED ORDER — TAMSULOSIN HCL 0.4 MG PO CAPS: 0.4000 mg | ORAL_CAPSULE | Freq: Every day | ORAL | Status: DC

## 2015-03-10 NOTE — Patient Instructions (Addendum)
Please take all new medication as prescribed - the antibiotic (sent to pharmacy), and the flomax re-start  Your specimen will be sent for culture  You will be contacted by phone if any changes need to be made immediately.  Otherwise, you will receive a letter about your results with an explanation, but please check with MyChart first.  Please continue all other medications as before, and refills have been done if requested - the alprazolam  Please have the pharmacy call with any other refills you may need.  Please keep your appointments with your specialists as you may have planned

## 2015-03-10 NOTE — Progress Notes (Signed)
Subjective:    Patient ID: [name], male    DOB: 25-Aug-1946, 69 y.o.   MRN: [id number]  HPI  Here with wife, has hx of UTI with confusion Wife states has had 3 days mild worsening confusion but no agitation, takes po and meds well, but also with urinary freq, and ? Low grade temp.  Pt denies chest pain, increased sob or doe, wheezing, orthopnea, PND, increased LE swelling, palpitations, dizziness or syncope.  Pt denies new neurological symptoms such as new headache, or facial or extremity weakness or numbness   Pt denies polydipsia, polyuria. Dementia overall stable symptom wise, and not assoc with behavioral changes such as hallucinations, paranoia, or agitation. Past Medical History  Diagnosis Date  . Hyperlipidemia   . Vitamin B12 deficiency   . Occasional tremors     leg tremors  . BPH (benign prostatic hypertrophy) with urinary obstruction   . History of CVA (cerebrovascular accident) without residual deficits     2011  . Other degenerative diseases of the basal ganglia     S/P CVA 2011--  CHRONIC LEFT BASAL GANGLIA LACUNAR INFARCT  PER CT  . History of nonmelanoma skin cancer     EXCISION OF NOSE  . Hypertension   . Bladder stone   . Schizophrenia     HX PSYCHIATRIC ADMISSION'S  AND ECT ([place]) TX  . Bipolar affective disorder   . ED (erectile dysfunction) of organic origin   . Dementia   . Depression   . Constipation   . Generalized weakness     LOWER EXTREMITIES  . Chronic anxiety   . Urinary bladder stone 08/12/2014   Past Surgical History  Procedure Laterality Date  . Transanal excision anal and rectal polyps  11-02-2000  . Transthoracic echocardiogram  02-03-2012    GRADE I DIASTOLIC DYSFUNCTION/  EF 60-65%  . Cardiovascular stress test  06-18-2013  DR Jenkins [place]    LOW RISK NO EXERCISE LEXISCAN STUDY/  NO ISCHEMIA, CANNOT RULE OUT BASAL INFERIOR INFARCTION BUT POSSIBLE ARTIFACT GIVEN NORMAL WALL MOTION/  EF 73%  . Excision tumor upper left thigh  2012  .  Transurethral incision of prostate N/A 10/03/2013    Procedure: TRANSURETHRAL INCISION OF THE PROSTATE (TUIP);  Surgeon: Claybon Jabs, MD;  Location: [facility];  Service: Urology;  Laterality: N/A;  . Cystoscopy with litholapaxy N/A 10/03/2013    Procedure: CYSTOSCOPY WITH LITHOLAPAXY;  Surgeon: Claybon Jabs, MD;  Location: [facility];  Service: Urology;  Laterality: N/A;  . Holmium laser application N/A 40/98/1191    Procedure: HOLMIUM LASER APPLICATION;  Surgeon: Claybon Jabs, MD;  Location: [facility];  Service: Urology;  Laterality: N/A;    reports that he has been smoking Cigarettes.  He has a 24 pack-year smoking history. He has never used smokeless tobacco. He reports that he does not drink alcohol or use illicit drugs. family history includes Dementia in his father and mother. There is no history of Colon cancer, Colon polyps, Rectal cancer, or Stomach cancer. No Known Allergies Current Outpatient Prescriptions on File Prior to Visit  Medication Sig Dispense Refill  . amLODipine (NORVASC) 10 MG tablet Take 1 tablet (10 mg total) by mouth daily. 30 tablet 3  . atorvastatin (LIPITOR) 10 MG tablet     . bisacodyl (DULCOLAX) 5 MG EC tablet Take 15 mg by mouth daily as needed (For constipation.).     Marland Kitchen carbidopa-levodopa (SINEMET IR) 25-100 MG per  tablet Take 1 tablet by mouth 3 (three) times daily. 90 tablet 2  . fesoterodine (TOVIAZ) 8 MG TB24 tablet Take 8 mg by mouth daily.    . Flaxseed, Linseed, (FLAXSEED OIL PO) Take 2 capsules by mouth daily.    Marland Kitchen levofloxacin (LEVAQUIN) 250 MG tablet Take 1 tablet (250 mg total) by mouth daily. 10 tablet 0  . mirtazapine (REMERON) 15 MG tablet Take 1 tablet (15 mg total) by mouth at bedtime. 90 tablet 3  . omega-3 acid ethyl esters (LOVAZA) 1 G capsule Take 3 g by mouth daily.    . Omega-3 Fatty Acids (FISH OIL) 1200 MG CAPS Take 2,400 mg by mouth daily.    Marland Kitchen omeprazole (PRILOSEC) 20 MG capsule  Take 1 capsule (20 mg total) by mouth daily. (Patient taking differently: Take 20 mg by mouth daily as needed (For heartburn or acid reflux.). ) 30 capsule 11  . ondansetron (ZOFRAN) 4 MG tablet TAKE 1 TABLET BY MOUTH EVERY 8 HOURS AS NEEDED FOR NAUSEA AND VOMITING 90 tablet 0  . polyethylene glycol powder (MIRALAX) powder Take 17 g by mouth daily. 527 g 0  . vitamin B-12 1000 MCG tablet Take 1 tablet (1,000 mcg total) by mouth daily. 30 tablet 0  . zolpidem (AMBIEN) 5 MG tablet Take 1 tablet (5 mg total) by mouth at bedtime as needed for sleep. 30 tablet 5   No current facility-administered medications on file prior to visit.   Review of Systems  Constitutional: Negative for unusual diaphoresis or night sweats HENT: Negative for ringing in ear or discharge Eyes: Negative for double vision or worsening visual disturbance.  Respiratory: Negative for choking and stridor.   Gastrointestinal: Negative for vomiting or other signifcant bowel change Genitourinary: Negative for hematuria or change in urine volume.  Musculoskeletal: Negative for other MSK pain or swelling Skin: Negative for color change and worsening wound.  Neurological: Negative for tremors and numbness other than noted  Psychiatric/Behavioral: Negative for decreased concentration or agitation other than above       Objective:   Physical Exam BP 142/90 mmHg  Pulse 74  Temp(Src) 98.8 F (37.1 C) (Oral)  Resp 18  Wt 205 lb (92.987 kg)  SpO2 96% VS noted, appears fatigued Constitutional: Pt appears in no significant distress HENT: Head: NCAT.  Right Ear: External ear normal.  Left Ear: External ear normal.  Eyes: . Pupils are equal, round, and reactive to light. Conjunctivae and EOM are normal Neck: Normal range of motion. Neck supple.  Cardiovascular: Normal rate and regular rhythm.   Pulmonary/Chest: Effort normal and breath sounds without rales or wheezing.  Abd:  Soft, NT, ND, + BS, no flank tender Neurological: Pt  is alert. + confused , motor grossly intact Skin: Skin is warm. No rash, no LE edema Psychiatric: Pt behavior is normal. No agitation.     Assessment & Plan:

## 2015-03-11 ENCOUNTER — Telehealth: Payer: Self-pay | Admitting: Internal Medicine

## 2015-03-11 LAB — URINE CULTURE
COLONY COUNT: NO GROWTH
Organism ID, Bacteria: NO GROWTH

## 2015-03-11 NOTE — Telephone Encounter (Signed)
Spoke with pharmacist md miss and hit print gave verbal authorization to pharmacist to fill...[name]

## 2015-03-11 NOTE — Telephone Encounter (Signed)
Pt called in said that pharmacy doesn't have the tamsulosin (FLOMAX) 0.4 MG CAPS capsule [[id number]].  Can we call it in again?

## 2015-03-14 NOTE — Assessment & Plan Note (Signed)
stable overall by history and exam, recent data reviewed with pt, and pt to continue medical treatment as before,  to f/u any worsening symptoms or concerns BP Readings from Last 3 Encounters:  03/10/15 142/90  12/15/14 148/90  11/25/14 145/93

## 2015-03-14 NOTE — Assessment & Plan Note (Signed)
For re-start flomax .4 qd, declines urology,  to f/u any worsening symptoms or concerns

## 2015-03-14 NOTE — Assessment & Plan Note (Signed)
?   Infectious/UTI vs other, for urine studies, empiric antibx,  to f/u any worsening symptoms or concerns

## 2015-04-02 ENCOUNTER — Other Ambulatory Visit: Payer: Self-pay | Admitting: Internal Medicine

## 2015-04-26 ENCOUNTER — Other Ambulatory Visit: Payer: Self-pay | Admitting: Internal Medicine

## 2015-05-11 ENCOUNTER — Ambulatory Visit: Payer: Medicare Other | Admitting: Internal Medicine

## 2015-06-02 ENCOUNTER — Other Ambulatory Visit: Payer: Self-pay | Admitting: Internal Medicine

## 2015-06-02 NOTE — Telephone Encounter (Signed)
Rx faxed to pharmacy  

## 2015-06-02 NOTE — Telephone Encounter (Signed)
Done hardcopy to Dahlia  

## 2015-06-09 ENCOUNTER — Encounter: Payer: Self-pay | Admitting: Internal Medicine

## 2015-06-09 ENCOUNTER — Ambulatory Visit (INDEPENDENT_AMBULATORY_CARE_PROVIDER_SITE_OTHER): Payer: Medicare Other | Admitting: Internal Medicine

## 2015-06-09 VITALS — BP 136/88 | HR 76 | Temp 98.5°F | Ht 72.0 in | Wt 201.0 lb

## 2015-06-09 DIAGNOSIS — G20 Parkinson's disease: Secondary | ICD-10-CM | POA: Diagnosis not present

## 2015-06-09 DIAGNOSIS — R32 Unspecified urinary incontinence: Secondary | ICD-10-CM

## 2015-06-09 DIAGNOSIS — G894 Chronic pain syndrome: Secondary | ICD-10-CM

## 2015-06-09 DIAGNOSIS — Z0000 Encounter for general adult medical examination without abnormal findings: Secondary | ICD-10-CM

## 2015-06-09 DIAGNOSIS — R110 Nausea: Secondary | ICD-10-CM | POA: Insufficient documentation

## 2015-06-09 DIAGNOSIS — N320 Bladder-neck obstruction: Secondary | ICD-10-CM

## 2015-06-09 MED ORDER — PANTOPRAZOLE SODIUM 40 MG PO TBEC: 40.0000 mg | DELAYED_RELEASE_TABLET | Freq: Every day | ORAL | Status: AC

## 2015-06-09 MED ORDER — TRAMADOL HCL 50 MG PO TABS: 50.0000 mg | ORAL_TABLET | Freq: Three times a day (TID) | ORAL | Status: DC | PRN

## 2015-06-09 MED ORDER — MIRTAZAPINE 15 MG PO TABS: 15.0000 mg | ORAL_TABLET | Freq: Every day | ORAL | Status: DC

## 2015-06-09 MED ORDER — CARBIDOPA-LEVODOPA 25-100 MG PO TABS: 1.0000 | ORAL_TABLET | Freq: Three times a day (TID) | ORAL | Status: DC

## 2015-06-09 MED ORDER — PROMETHAZINE HCL 12.5 MG PO TABS: 12.5000 mg | ORAL_TABLET | Freq: Three times a day (TID) | ORAL | Status: DC | PRN

## 2015-06-09 NOTE — Patient Instructions (Signed)
Please take all new medication as prescribed - the phenergan for nausea, and protonix for stomach acid, and tramadol for pain  Please continue all other medications as before, and refills have been done if requested - the remeron, and the sinamet  Please have the pharmacy call with any other refills you may need.  Please continue your efforts at being more active, low cholesterol diet, and weight control.  You are otherwise up to date with prevention measures today.  You will be contacted regarding the referral for: Neurology  Please keep your appointments with your specialists as you may have planned  Please go to the LAB in the Basement (turn left off the elevator) for the tests to be done tomorrow  You will be contacted by phone if any changes need to be made immediately.  Otherwise, you will receive a letter about your results with an explanation, but please check with MyChart first.  Please remember to sign up for MyChart if you have not done so, as this will be important to you in the future with finding out test results, communicating by private email, and scheduling acute appointments online when needed.  Please return in 6 months, or sooner if needed

## 2015-06-09 NOTE — Progress Notes (Signed)
Subjective:    Patient ID: [name], male    DOB: [birth year], 69 y.o.   MRN: [id number]  HPI  Here for wellness and f/u;  Overall not doing as well as wife was not convinced based on how the sinamet was started and MD comments as to whether pt has parkinsons, so she gave for a few days after last hospn, then stopped. Has not followed up with neurology.  Wife describes bradykinesia on standing to walk, rigidity, shuffling gait, but was concerned when a therapist also voiced concern about whether pt had parkinsons during a therapy tx.  Pt now with typical physical findings today as above off the sinamet.;  Pt and wife denies Chest pain, worsening SOB, DOE, wheezing, orthopnea, PND, worsening LE edema, palpitations, dizziness or syncope.  Pt denies neurological change such as new headache, facial or extremity weakness.  Pt denies polydipsia, polyuria, or low sugar symptoms. Pt states overall good compliance with treatment and medications, good tolerability, and has been trying to follow appropriate diet.  Pt denies worsening depressive symptoms, suicidal ideation or panic. No fever, night sweats, wt loss, loss of appetite, or other constitutional symptoms.  Pt states good ability with ADL's, has low fall risk, home safety reviewed and adequate, no other significant changes in hearing or vision, and only occasionally active with exercise. Denies worsening reflux, abd pain, dysphagia, n/v, bowel change or blood, but does have what sounds like mild dyspepsia, not doing well with prn omeprazole 20 mg generic.  Also with ongoing pain control and sleep issues which is a large part of why wife brings today as he is a handful and she has little to no help.    Dementia  not assoc with behavioral changes such as hallucinations, paranoia, or agitation so far but has incontinence where he will simply wet the pants and refuses depends. Past Medical History  Diagnosis Date  . Hyperlipidemia   . Vitamin B12 deficiency    . Occasional tremors     leg tremors  . BPH (benign prostatic hypertrophy) with urinary obstruction   . History of CVA (cerebrovascular accident) without residual deficits     2011  . Other degenerative diseases of the basal ganglia     S/P CVA 2011--  CHRONIC LEFT BASAL GANGLIA LACUNAR INFARCT  PER CT  . History of nonmelanoma skin cancer     EXCISION OF NOSE  . Hypertension   . Bladder stone   . Schizophrenia     HX PSYCHIATRIC ADMISSION'S  AND ECT (SHOCK) TX  . Bipolar affective disorder   . ED (erectile dysfunction) of organic origin   . Dementia   . Depression   . Constipation   . Generalized weakness     LOWER EXTREMITIES  . Chronic anxiety   . Urinary bladder stone 08/12/2014   Past Surgical History  Procedure Laterality Date  . Transanal excision anal and rectal polyps  11-02-2000  . Transthoracic echocardiogram  02-03-2012    GRADE I DIASTOLIC DYSFUNCTION/  EF 60-65%  . Cardiovascular stress test  06-18-2013  DR Jenkins [place]    LOW RISK NO EXERCISE LEXISCAN STUDY/  NO ISCHEMIA, CANNOT RULE OUT BASAL INFERIOR INFARCTION BUT POSSIBLE ARTIFACT GIVEN NORMAL WALL MOTION/  EF 73%  . Excision tumor upper left thigh  2012  . Transurethral incision of prostate N/A 10/03/2013    Procedure: TRANSURETHRAL INCISION OF THE PROSTATE (TUIP);  Surgeon: Claybon Jabs, MD;  Location: [facility];  Service:  Urology;  Laterality: N/A;  . Cystoscopy with litholapaxy N/A 10/03/2013    Procedure: CYSTOSCOPY WITH LITHOLAPAXY;  Surgeon: Claybon Jabs, MD;  Location[facility];  Service: Urology;  Laterality: N/A;  . Holmium laser application N/A 01/75/1025    Procedure: HOLMIUM LASER APPLICATION;  Surgeon: Claybon Jabs, MD;  Location: [facility];  Service: Urology;  Laterality: N/A;    reports that he has been smoking Cigarettes.  He has a 24 pack-year smoking history. He has never used smokeless tobacco. He reports that he does not drink  alcohol or use illicit drugs. family history includes Dementia in his father and mother. There is no history of Colon cancer, Colon polyps, Rectal cancer, or Stomach cancer. No Known Allergies Current Outpatient Prescriptions on File Prior to Visit  Medication Sig Dispense Refill  . ALPRAZolam (XANAX) 0.5 MG tablet TAKE 1 TABLET BY MOUTH THREE TIMES DAILY AS NEEDED FOR ANXIETY 90 tablet 2  . amLODipine (NORVASC) 10 MG tablet Take 1 tablet (10 mg total) by mouth daily. 30 tablet 3  . tamsulosin (FLOMAX) 0.4 MG CAPS capsule Take 1 capsule (0.4 mg total) by mouth daily after supper. 90 capsule 3  . atorvastatin (LIPITOR) 10 MG tablet     . bisacodyl (DULCOLAX) 5 MG EC tablet Take 15 mg by mouth daily as needed (For constipation.).     Marland Kitchen fesoterodine (TOVIAZ) 8 MG TB24 tablet Take 8 mg by mouth daily.    . Flaxseed, Linseed, (FLAXSEED OIL PO) Take 2 capsules by mouth daily.    Marland Kitchen omega-3 acid ethyl esters (LOVAZA) 1 G capsule Take 3 g by mouth daily.    . Omega-3 Fatty Acids (FISH OIL) 1200 MG CAPS Take 2,400 mg by mouth daily.    . polyethylene glycol powder (MIRALAX) powder Take 17 g by mouth daily. (Patient not taking: Reported on 06/09/2015) 527 g 0  . vitamin B-12 1000 MCG tablet Take 1 tablet (1,000 mcg total) by mouth daily. (Patient not taking: Reported on 06/09/2015) 30 tablet 0   No current facility-administered medications on file prior to visit.   Review of Systems mostly per wife, pt with dementia Constitutional: Negative for increased diaphoresis, other activity, appetite or siginficant weight change other than noted HENT: Negative for worsening hearing loss, ear pain, facial swelling, mouth sores and neck stiffness.   Eyes: Negative for other worsening pain, redness or visual disturbance.  Respiratory: Negative for shortness of breath and wheezing  Cardiovascular: Negative for chest pain and palpitations.  Gastrointestinal: Negative for diarrhea, blood in stool, abdominal distention or  other pain Genitourinary: Negative for hematuria, flank pain or change in urine volume.  Musculoskeletal: Negative for myalgias Skin: Negative for color change and wound or drainage.  Neurological: Negative for syncope and numbness. other than noted Hematological: Negative for adenopathy. or other swelling Psychiatric/Behavioral: Negative for hallucinations, SI, self-injury, decreased concentration or other worsening agitation.      Objective:   Physical Exam BP 136/88 mmHg  Pulse 76  Temp(Src) 98.5 F (36.9 C) (Oral)  Ht 6' (1.829 m)  Wt 201 lb (91.173 kg)  BMI 27.25 kg/m2  SpO2 95% VS noted,  Constitutional: Pt is oriented to person, place, and time. Appears well-developed and well-nourished, in no significant distress Head: Normocephalic and atraumatic.  Right Ear: External ear normal.  Left Ear: External ear normal.  Nose: Nose normal.  Mouth/Throat: Oropharynx is clear and moist.  Eyes: Conjunctivae and EOM are normal. Pupils are equal, round,  and reactive to light.  Neck: Normal range of motion. Neck supple. No JVD present. No tracheal deviation present or significant neck LA or mass Cardiovascular: Normal rate, regular rhythm, normal heart sounds and intact distal pulses.   Pulmonary/Chest: Effort normal and breath sounds without rales or wheezing  Abdominal: Soft. Bowel sounds are normal. NT. No HSM  Musculoskeletal: Normal range of motion. Exhibits no edema.  Lymphadenopathy:  Has no cervical adenopathy.  Neurological: Pt is alert and oriented to person only. Pt has delayed reflexes No cranial nerve deficit. Motor grossly intact, + bradykinesia on standing to walk, + postural reflex decreased, + shuffling gate, + tremorous Skin: Skin is warm and dry. No rash noted.  Psychiatric:  Has normal mood and affect. Behavior is normal.     Assessment & Plan:

## 2015-06-09 NOTE — Progress Notes (Signed)
Pre visit review using our clinic review tool, if applicable. No additional management support is needed unless otherwise documented below in the visit note. 

## 2015-06-10 ENCOUNTER — Telehealth: Payer: Self-pay | Admitting: Internal Medicine

## 2015-06-10 ENCOUNTER — Other Ambulatory Visit (INDEPENDENT_AMBULATORY_CARE_PROVIDER_SITE_OTHER): Payer: Medicare Other

## 2015-06-10 DIAGNOSIS — N390 Urinary tract infection, site not specified: Secondary | ICD-10-CM

## 2015-06-10 DIAGNOSIS — Z0000 Encounter for general adult medical examination without abnormal findings: Secondary | ICD-10-CM | POA: Diagnosis not present

## 2015-06-10 DIAGNOSIS — E785 Hyperlipidemia, unspecified: Secondary | ICD-10-CM

## 2015-06-10 DIAGNOSIS — R32 Unspecified urinary incontinence: Secondary | ICD-10-CM | POA: Diagnosis not present

## 2015-06-10 DIAGNOSIS — G20 Parkinson's disease: Secondary | ICD-10-CM

## 2015-06-10 DIAGNOSIS — R110 Nausea: Secondary | ICD-10-CM

## 2015-06-10 DIAGNOSIS — N320 Bladder-neck obstruction: Secondary | ICD-10-CM

## 2015-06-10 DIAGNOSIS — G894 Chronic pain syndrome: Secondary | ICD-10-CM

## 2015-06-10 DIAGNOSIS — R972 Elevated prostate specific antigen [PSA]: Secondary | ICD-10-CM

## 2015-06-10 LAB — HEPATIC FUNCTION PANEL
ALT: 13 U/L (ref 0–53)
AST: 13 U/L (ref 0–37)
Albumin: 4 g/dL (ref 3.5–5.2)
Alkaline Phosphatase: 88 U/L (ref 39–117)
Bilirubin, Direct: 0.2 mg/dL (ref 0.0–0.3)
TOTAL PROTEIN: 6.4 g/dL (ref 6.0–8.3)
Total Bilirubin: 0.8 mg/dL (ref 0.2–1.2)

## 2015-06-10 LAB — CBC WITH DIFFERENTIAL/PLATELET
Basophils Absolute: 0 10*3/uL (ref 0.0–0.1)
Basophils Relative: 0.4 % (ref 0.0–3.0)
EOS ABS: 0.1 10*3/uL (ref 0.0–0.7)
Eosinophils Relative: 1.5 % (ref 0.0–5.0)
HCT: 44.9 % (ref 39.0–52.0)
Hemoglobin: 15.6 g/dL (ref 13.0–17.0)
Lymphocytes Relative: 24.2 % (ref 12.0–46.0)
Lymphs Abs: 1.9 10*3/uL (ref 0.7–4.0)
MCHC: 34.6 g/dL (ref 30.0–36.0)
MCV: 90.4 fl (ref 78.0–100.0)
Monocytes Absolute: 0.5 10*3/uL (ref 0.1–1.0)
Monocytes Relative: 6.3 % (ref 3.0–12.0)
NEUTROS ABS: 5.4 10*3/uL (ref 1.4–7.7)
NEUTROS PCT: 67.6 % (ref 43.0–77.0)
PLATELETS: 243 10*3/uL (ref 150.0–400.0)
RBC: 4.97 Mil/uL (ref 4.22–5.81)
RDW: 13.8 % (ref 11.5–15.5)
WBC: 8 10*3/uL (ref 4.0–10.5)

## 2015-06-10 LAB — URINALYSIS, ROUTINE W REFLEX MICROSCOPIC
Ketones, ur: NEGATIVE
NITRITE: POSITIVE — AB
Specific Gravity, Urine: 1.025 (ref 1.000–1.030)
TOTAL PROTEIN, URINE-UPE24: 100 — AB
UROBILINOGEN UA: 1 (ref 0.0–1.0)
Urine Glucose: NEGATIVE
pH: 6.5 (ref 5.0–8.0)

## 2015-06-10 LAB — LIPID PANEL
CHOLESTEROL: 177 mg/dL (ref 0–200)
HDL: 41.2 mg/dL (ref >39.00)
LDL Cholesterol: 119 mg/dL — ABNORMAL HIGH (ref 0–99)
NONHDL: 135.8
Total CHOL/HDL Ratio: 4
Triglycerides: 83 mg/dL (ref 0.0–149.0)
VLDL: 16.6 mg/dL (ref 0.0–40.0)

## 2015-06-10 LAB — PSA: PSA: 5.56 ng/mL — AB (ref 0.10–4.00)

## 2015-06-10 LAB — BASIC METABOLIC PANEL
BUN: 10 mg/dL (ref 6–23)
CALCIUM: 9.2 mg/dL (ref 8.4–10.5)
CHLORIDE: 108 meq/L (ref 96–112)
CO2: 29 mEq/L (ref 19–32)
CREATININE: 0.91 mg/dL (ref 0.40–1.50)
GFR: 87.56 mL/min (ref >60.00)
Glucose, Bld: 89 mg/dL (ref 70–99)
Potassium: 3.7 mEq/L (ref 3.5–5.1)
Sodium: 144 mEq/L (ref 135–145)

## 2015-06-10 LAB — TSH: TSH: 1.29 u[IU]/mL (ref 0.35–4.50)

## 2015-06-10 MED ORDER — CIPROFLOXACIN HCL 250 MG PO TABS: 250.0000 mg | ORAL_TABLET | Freq: Two times a day (BID) | ORAL | Status: DC

## 2015-06-10 NOTE — Assessment & Plan Note (Signed)
Likely dementia related, for UA with labs, declines urology referral

## 2015-06-10 NOTE — Assessment & Plan Note (Signed)
Suspect arthritic related, for tramadol prn,  to f/u any worsening symptoms or concerns

## 2015-06-10 NOTE — Addendum Note (Signed)
Addended by: Biagio Borg on: 06/10/2015 01:37 PM   Modules accepted: Orders, SmartSet

## 2015-06-10 NOTE — Assessment & Plan Note (Signed)

## 2015-06-10 NOTE — Telephone Encounter (Signed)
Pt's spouse advised, copy of labs mailed

## 2015-06-10 NOTE — Telephone Encounter (Signed)
I accidentally signed the review button before sending a letter to pt and wife  Pt has prob UTI, and PSA is a bit more elevated - rest of labs were ok  Plan   cipro course (done erx)  Refer back to Dr Ottellin/urology  OK to send copy of labs if pt wife wants this

## 2015-06-10 NOTE — Assessment & Plan Note (Signed)
With dyspepsia - for phenergan as zofran not helping, change omeprazole to protonix 40 qd

## 2015-06-10 NOTE — Assessment & Plan Note (Signed)
D/w wife typical parkinsons signs - for re-start sinamet, refer neurology - Dr Carles Collet

## 2015-07-14 ENCOUNTER — Other Ambulatory Visit: Payer: Self-pay | Admitting: Internal Medicine

## 2015-07-15 ENCOUNTER — Ambulatory Visit: Payer: Medicare Other | Admitting: Neurology

## 2015-07-19 ENCOUNTER — Emergency Department (HOSPITAL_COMMUNITY): Payer: Medicare Other

## 2015-07-19 ENCOUNTER — Telehealth: Payer: Self-pay | Admitting: Internal Medicine

## 2015-07-19 ENCOUNTER — Encounter (HOSPITAL_COMMUNITY): Payer: Self-pay

## 2015-07-19 ENCOUNTER — Inpatient Hospital Stay (HOSPITAL_COMMUNITY)
Admission: EM | Admit: 2015-07-19 | Discharge: 2015-07-22 | DRG: 689 | Disposition: A | Discharge status: Home / Self Care | Payer: Medicare Other | Attending: Family Medicine | Admitting: Family Medicine

## 2015-07-19 DIAGNOSIS — K219 Gastro-esophageal reflux disease without esophagitis: Secondary | ICD-10-CM | POA: Diagnosis present

## 2015-07-19 DIAGNOSIS — N400 Enlarged prostate without lower urinary tract symptoms: Secondary | ICD-10-CM | POA: Diagnosis present

## 2015-07-19 DIAGNOSIS — Z79899 Other long term (current) drug therapy: Secondary | ICD-10-CM

## 2015-07-19 DIAGNOSIS — N3091 Cystitis, unspecified with hematuria: Secondary | ICD-10-CM | POA: Diagnosis present

## 2015-07-19 DIAGNOSIS — E538 Deficiency of other specified B group vitamins: Secondary | ICD-10-CM | POA: Diagnosis present

## 2015-07-19 DIAGNOSIS — I4729 Other ventricular tachycardia: Secondary | ICD-10-CM

## 2015-07-19 DIAGNOSIS — N139 Obstructive and reflux uropathy, unspecified: Secondary | ICD-10-CM | POA: Diagnosis present

## 2015-07-19 DIAGNOSIS — N3090 Cystitis, unspecified without hematuria: Secondary | ICD-10-CM

## 2015-07-19 DIAGNOSIS — R531 Weakness: Secondary | ICD-10-CM

## 2015-07-19 DIAGNOSIS — I509 Heart failure, unspecified: Secondary | ICD-10-CM | POA: Diagnosis present

## 2015-07-19 DIAGNOSIS — F209 Schizophrenia, unspecified: Secondary | ICD-10-CM | POA: Diagnosis present

## 2015-07-19 DIAGNOSIS — R112 Nausea with vomiting, unspecified: Secondary | ICD-10-CM | POA: Diagnosis present

## 2015-07-19 DIAGNOSIS — F0390 Unspecified dementia without behavioral disturbance: Secondary | ICD-10-CM | POA: Diagnosis present

## 2015-07-19 DIAGNOSIS — N10 Acute tubulo-interstitial nephritis: Secondary | ICD-10-CM

## 2015-07-19 DIAGNOSIS — Z8673 Personal history of transient ischemic attack (TIA), and cerebral infarction without residual deficits: Secondary | ICD-10-CM

## 2015-07-19 DIAGNOSIS — N210 Calculus in bladder: Secondary | ICD-10-CM | POA: Diagnosis present

## 2015-07-19 DIAGNOSIS — N138 Other obstructive and reflux uropathy: Secondary | ICD-10-CM | POA: Diagnosis present

## 2015-07-19 DIAGNOSIS — R627 Adult failure to thrive: Secondary | ICD-10-CM | POA: Diagnosis present

## 2015-07-19 DIAGNOSIS — I10 Essential (primary) hypertension: Secondary | ICD-10-CM | POA: Diagnosis present

## 2015-07-19 DIAGNOSIS — E876 Hypokalemia: Secondary | ICD-10-CM | POA: Diagnosis present

## 2015-07-19 DIAGNOSIS — E785 Hyperlipidemia, unspecified: Secondary | ICD-10-CM | POA: Diagnosis present

## 2015-07-19 DIAGNOSIS — M1990 Unspecified osteoarthritis, unspecified site: Secondary | ICD-10-CM | POA: Diagnosis present

## 2015-07-19 DIAGNOSIS — F17210 Nicotine dependence, cigarettes, uncomplicated: Secondary | ICD-10-CM | POA: Diagnosis present

## 2015-07-19 DIAGNOSIS — G9341 Metabolic encephalopathy: Secondary | ICD-10-CM | POA: Diagnosis present

## 2015-07-19 DIAGNOSIS — R079 Chest pain, unspecified: Secondary | ICD-10-CM

## 2015-07-19 DIAGNOSIS — R110 Nausea: Secondary | ICD-10-CM

## 2015-07-19 DIAGNOSIS — F319 Bipolar disorder, unspecified: Secondary | ICD-10-CM | POA: Diagnosis present

## 2015-07-19 DIAGNOSIS — I639 Cerebral infarction, unspecified: Secondary | ICD-10-CM | POA: Diagnosis present

## 2015-07-19 DIAGNOSIS — F419 Anxiety disorder, unspecified: Secondary | ICD-10-CM | POA: Diagnosis present

## 2015-07-19 DIAGNOSIS — N401 Enlarged prostate with lower urinary tract symptoms: Secondary | ICD-10-CM | POA: Diagnosis present

## 2015-07-19 DIAGNOSIS — K5900 Constipation, unspecified: Secondary | ICD-10-CM | POA: Diagnosis present

## 2015-07-19 DIAGNOSIS — G20 Parkinson's disease: Secondary | ICD-10-CM | POA: Diagnosis present

## 2015-07-19 DIAGNOSIS — G894 Chronic pain syndrome: Secondary | ICD-10-CM | POA: Diagnosis present

## 2015-07-19 DIAGNOSIS — R630 Anorexia: Secondary | ICD-10-CM | POA: Diagnosis present

## 2015-07-19 DIAGNOSIS — I472 Ventricular tachycardia: Secondary | ICD-10-CM | POA: Diagnosis present

## 2015-07-19 DIAGNOSIS — Z79891 Long term (current) use of opiate analgesic: Secondary | ICD-10-CM

## 2015-07-19 LAB — BASIC METABOLIC PANEL
Anion gap: 9 (ref 5–15)
BUN: 12 mg/dL (ref 6–20)
CO2: 25 mmol/L (ref 22–32)
CREATININE: 1.06 mg/dL (ref 0.61–1.24)
Calcium: 9.2 mg/dL (ref 8.9–10.3)
Chloride: 103 mmol/L (ref 101–111)
GFR calc Af Amer: >60 mL/min (ref >60)
GFR calc non Af Amer: >60 mL/min (ref >60)
Glucose, Bld: 111 mg/dL — ABNORMAL HIGH (ref 65–99)
Potassium: 3.4 mmol/L — ABNORMAL LOW (ref 3.5–5.1)
SODIUM: 137 mmol/L (ref 135–145)

## 2015-07-19 LAB — I-STAT CG4 LACTIC ACID, ED: Lactic Acid, Venous: 0.81 mmol/L (ref 0.5–2.0)

## 2015-07-19 LAB — URINE MICROSCOPIC-ADD ON

## 2015-07-19 LAB — TROPONIN I

## 2015-07-19 LAB — CBC
HCT: 43.9 % (ref 39.0–52.0)
Hemoglobin: 15.3 g/dL (ref 13.0–17.0)
MCH: 31.3 pg (ref 26.0–34.0)
MCHC: 34.9 g/dL (ref 30.0–36.0)
MCV: 89.8 fL (ref 78.0–100.0)
PLATELETS: 229 10*3/uL (ref 150–400)
RBC: 4.89 MIL/uL (ref 4.22–5.81)
RDW: 13 % (ref 11.5–15.5)
WBC: 9.6 10*3/uL (ref 4.0–10.5)

## 2015-07-19 LAB — URINALYSIS, ROUTINE W REFLEX MICROSCOPIC
Glucose, UA: NEGATIVE mg/dL
Ketones, ur: NEGATIVE mg/dL
NITRITE: NEGATIVE
PH: 6 (ref 5.0–8.0)
Protein, ur: >300 mg/dL — AB
SPECIFIC GRAVITY, URINE: 1.028 (ref 1.005–1.030)
Urobilinogen, UA: 1 mg/dL (ref 0.0–1.0)

## 2015-07-19 MED ORDER — DEXTROSE 5 % IV SOLN
1.0000 g | Freq: Once | INTRAVENOUS | Status: AC
  Administered 2015-07-19: 1 g via INTRAVENOUS
  Filled 2015-07-19: qty 10

## 2015-07-19 MED ORDER — HYDROCODONE-ACETAMINOPHEN 5-325 MG PO TABS
2.0000 | ORAL_TABLET | Freq: Once | ORAL | Status: AC
  Administered 2015-07-19: 2 via ORAL
  Filled 2015-07-19: qty 2

## 2015-07-19 NOTE — ED Notes (Addendum)
Pt reports new onset 8/10 "pressure" chest pain since arriving here in the ED.  ED EKG performed.  Vital signs WNL.  Pt states that he has no abdominal or bladder pain but that it is uncomfortable when he is urinating.

## 2015-07-19 NOTE — ED Notes (Signed)
PA made aware of chest pain.  MD reviewed EKG.

## 2015-07-19 NOTE — ED Notes (Signed)
Bed: BO12 Expected date:  Expected time:  Means of arrival:  Comments: EMS/40F/fall

## 2015-07-19 NOTE — ED Notes (Signed)
Pt has been on antibiotics since 7/7 for UTI.  Given repeat antibiotics back to back.  Symptoms continue.  Pt states chest pain since yesterday with heaviness.  States left ankle is also swollen.

## 2015-07-19 NOTE — Telephone Encounter (Signed)
[place]  Patient Name: [name]  DOB: Jun 01, 1946    Initial Comment Has UTI and taking abx, still has frequency, urine is dark brown and ankle is swollen.    Nurse Assessment  Nurse: Harlow Mares, RN, Suanne Marker Date/Time Eilene Ghazi Time): 07/19/2015 5:11:17 PM  Confirm and document reason for call. If symptomatic, describe symptoms. ---Has UTI and taking abx Cephalexin 500mg  (completed), still has frequency, urine is dark brown and ankle is swollen. Dr. Jenny Reichmann placed him on abx and completed. PCA was elevated and anther MD placed Vancomycin 1000mg  and completed. Caller reports patient has frequency and dark urine. Reports that his chest feels strange today, describing a strain. (L) ankle is swollen and has CHF. Has appt on 07/27/15, caller worried that he needs to be sooner than this.  Has the patient traveled out of the country within the last 30 days? ---No  Does the patient require triage? ---Yes  Related visit to physician within the last 2 weeks? ---Yes   Dr. Jenny Reichmann and Cristie Hem for UTI symptoms.  Does the PT have any chronic conditions? (i.e. diabetes, asthma, etc.) ---Yes  List chronic conditions. ---CHF, Parkinson's, Dementia     Guidelines    Guideline Title Affirmed Question Affirmed Notes  Urination Pain - Male [1] Unable to urinate (or only a few drops) > 4 hours AND [2] bladder feels very full (e.g., feels blocked with strong urge to urinate; palpable bladder) Reports that patient is unable to completely empty bladder and is uncomfortable.   Final Disposition User   Go to ED Now Harlow Mares, RN, Rhonda    Referrals  [facility] - ED   Disagree/Comply: Comply

## 2015-07-19 NOTE — ED Provider Notes (Signed)
CSN: [id number]     Arrival date & time 07/19/15  1809 History   First MD Initiated Contact with Patient 07/19/15 2057     Chief Complaint  Patient presents with  . Chest Pain  . Dysuria     (Consider location/radiation/quality/duration/timing/severity/associated sxs/prior Treatment) HPI   [name] is a 69 y.o. male  with a hx of dementia, CHF, presents to the Emergency Department complaining of gradual, persistent, progressively worsening dysuria, hematuria, urinary urgency onset several weeks ago. Associated symptoms include nausea, generalized weakness, decreased appetite.  Pt saw his PPC for generalized weakness and was dx with a UTI via culture and was given Cipro.  Symptoms persisted and he was put on doxy which was finished yesterday.  Nothing makes it better or worse  Pt denies fever, chills, headache, neck pain, SOB, abd pain, vomiting, diarrhea, syncope.    Pt reports he is having chest pain described as a heaviness.  He reports he "doesn't feel right."  Pt reports the chest pain began yesterday.  He reports taking an ASA yesterday but not today.  Denies syncope, near syncope, vomiting, diaphoresis.    Wife reports that pt is not more confused than usual.    PCP: Cathlean Cower     Past Medical History  Diagnosis Date  . Hyperlipidemia   . Vitamin B12 deficiency   . Occasional tremors     leg tremors  . BPH (benign prostatic hypertrophy) with urinary obstruction   . History of CVA (cerebrovascular accident) without residual deficits     2011  . Other degenerative diseases of the basal ganglia     S/P CVA 2011--  CHRONIC LEFT BASAL GANGLIA LACUNAR INFARCT  PER CT  . History of nonmelanoma skin cancer     EXCISION OF NOSE  . Hypertension   . Bladder stone   . Schizophrenia     HX PSYCHIATRIC ADMISSION'S  AND ECT (SHOCK) TX  . Bipolar affective disorder   . ED (erectile dysfunction) of organic origin   . Dementia   . Depression   . Constipation   . Generalized  weakness     LOWER EXTREMITIES  . Chronic anxiety   . Urinary bladder stone 08/12/2014   Past Surgical History  Procedure Laterality Date  . Transanal excision anal and rectal polyps  11-02-2000  . Transthoracic echocardiogram  02-03-2012    GRADE I DIASTOLIC DYSFUNCTION/  EF 60-65%  . Cardiovascular stress test  06-18-2013  DR Jenkins [place]    LOW RISK NO EXERCISE LEXISCAN STUDY/  NO ISCHEMIA, CANNOT RULE OUT BASAL INFERIOR INFARCTION BUT POSSIBLE ARTIFACT GIVEN NORMAL WALL MOTION/  EF 73%  . Excision tumor upper left thigh  2012  . Transurethral incision of prostate N/A 10/03/2013    Procedure: TRANSURETHRAL INCISION OF THE PROSTATE (TUIP);  Surgeon: Claybon Jabs, MD;  Location: [facility];  Service: Urology;  Laterality: N/A;  . Cystoscopy with litholapaxy N/A 10/03/2013    Procedure: CYSTOSCOPY WITH LITHOLAPAXY;  Surgeon: Claybon Jabs, MD;  Location: [facility];  Service: Urology;  Laterality: N/A;  . Holmium laser application N/A 67/11/4579    Procedure: HOLMIUM LASER APPLICATION;  Surgeon: Claybon Jabs, MD;  Location: [facility];  Service: Urology;  Laterality: N/A;   Family History  Problem Relation Age of Onset  . Dementia Father   . Dementia Mother   . Colon cancer Neg Hx   . Colon polyps Neg Hx   .  Rectal cancer Neg Hx   . Stomach cancer Neg Hx    Social History  Substance Use Topics  . Smoking status: Current Every Day Smoker -- 0.50 packs/day for 48 years    Types: Cigarettes  . Smokeless tobacco: Never Used  . Alcohol Use: No    Review of Systems  Constitutional: Positive for fever. Negative for diaphoresis, appetite change, fatigue and unexpected weight change.  HENT: Negative for mouth sores.   Eyes: Negative for visual disturbance.  Respiratory: Negative for cough, chest tightness, shortness of breath and wheezing.   Cardiovascular: Positive for chest pain.  Gastrointestinal: Negative for nausea, vomiting,  abdominal pain, diarrhea and constipation.  Endocrine: Negative for polydipsia, polyphagia and polyuria.  Genitourinary: Negative for dysuria, urgency, frequency and hematuria.  Musculoskeletal: Positive for back pain. Negative for neck stiffness.  Skin: Negative for rash.  Allergic/Immunologic: Negative for immunocompromised state.  Neurological: Positive for weakness. Negative for syncope, light-headedness and headaches.  Hematological: Does not bruise/bleed easily.  Psychiatric/Behavioral: Negative for sleep disturbance. The patient is not nervous/anxious.       Allergies  Review of patient's allergies indicates no known allergies.  Home Medications   Prior to Admission medications   Medication Sig Start Date End Date Taking? Authorizing Provider  ALPRAZolam Duanne Moron) 0.5 MG tablet TAKE 1 TABLET BY MOUTH THREE TIMES DAILY AS NEEDED FOR ANXIETY 06/02/15  Yes Biagio Borg, MD  amLODipine (NORVASC) 10 MG tablet Take 1 tablet (10 mg total) by mouth daily. 11/25/14  Yes Reyne Dumas, MD  Flaxseed, Linseed, (FLAXSEED OIL PO) Take 2 capsules by mouth daily.   Yes Historical Provider, MD  mirtazapine (REMERON) 15 MG tablet Take 1 tablet (15 mg total) by mouth at bedtime. 06/09/15  Yes Biagio Borg, MD  omega-3 acid ethyl esters (LOVAZA) 1 G capsule Take 3 g by mouth daily.   Yes Historical Provider, MD  ondansetron (ZOFRAN) 4 MG tablet TAKE 1 TABLET BY MOUTH EVERY 8 HOURS AS NEEDED FOR NAUSEA OR VOMITING 07/14/15  Yes Biagio Borg, MD  pantoprazole (PROTONIX) 40 MG tablet Take 1 tablet (40 mg total) by mouth daily. 06/09/15  Yes Biagio Borg, MD  promethazine (PHENERGAN) 12.5 MG tablet Take 1 tablet (12.5 mg total) by mouth every 8 (eight) hours as needed for nausea or vomiting. 06/09/15  Yes Biagio Borg, MD  atorvastatin (LIPITOR) 10 MG tablet  08/12/14   Historical Provider, MD  bisacodyl (DULCOLAX) 5 MG EC tablet Take 15 mg by mouth daily as needed (For constipation.).     Historical Provider, MD   carbidopa-levodopa (SINEMET IR) 25-100 MG per tablet Take 1 tablet by mouth 3 (three) times daily. Patient not taking: Reported on 07/19/2015 06/09/15   Biagio Borg, MD  ciprofloxacin (CIPRO) 250 MG tablet Take 1 tablet (250 mg total) by mouth 2 (two) times daily. Patient not taking: Reported on 07/19/2015 06/10/15   Biagio Borg, MD  fesoterodine (TOVIAZ) 8 MG TB24 tablet Take 8 mg by mouth daily.    Historical Provider, MD  Omega-3 Fatty Acids (FISH OIL) 1200 MG CAPS Take 2,400 mg by mouth daily.    Historical Provider, MD  polyethylene glycol powder (MIRALAX) powder Take 17 g by mouth daily. Patient not taking: Reported on 06/09/2015 08/17/14   Janece Canterbury, MD  tamsulosin (FLOMAX) 0.4 MG CAPS capsule Take 1 capsule (0.4 mg total) by mouth daily after supper. 03/10/15   Biagio Borg, MD  traMADol (ULTRAM) 50 MG tablet Take  1 tablet (50 mg total) by mouth every 8 (eight) hours as needed. Patient taking differently: Take 50 mg by mouth every 8 (eight) hours as needed for moderate pain.  06/09/15   Biagio Borg, MD  vitamin B-12 1000 MCG tablet Take 1 tablet (1,000 mcg total) by mouth daily. Patient not taking: Reported on 06/09/2015 08/17/14   Janece Canterbury, MD   BP 155/89 mmHg  Pulse 87  Temp(Src) 98.7 F (37.1 C) (Rectal)  Resp 11  SpO2 96% Physical Exam  Constitutional: He appears well-developed and well-nourished. No distress.  Awake, alert, nontoxic appearance  HENT:  Head: Normocephalic and atraumatic.  Mouth/Throat: Oropharynx is clear and moist. No oropharyngeal exudate.  Eyes: Conjunctivae are normal. No scleral icterus.  Neck: Normal range of motion. Neck supple.  Cardiovascular: Normal rate, regular rhythm and intact distal pulses.   Pulmonary/Chest: Effort normal and breath sounds normal. No respiratory distress. He has no wheezes.  Equal chest expansion  Abdominal: Soft. Bowel sounds are normal. He exhibits no mass. There is no tenderness. There is no rebound and no guarding.  Hernia confirmed negative in the right inguinal area and confirmed negative in the left inguinal area.  Mild TTP in the supra pubic region TTP of the low back No CVA tenderness  Genitourinary: Testes normal and penis normal. Cremasteric reflex is present.  Musculoskeletal: Normal range of motion. He exhibits no edema.  Lymphadenopathy:       Right: No inguinal adenopathy present.       Left: No inguinal adenopathy present.  Neurological: He is alert.  Speech is clear and goal oriented Moves extremities without ataxia  Skin: Skin is warm and dry. He is not diaphoretic.  Psychiatric: He has a normal mood and affect.  Flat affect  Nursing note and vitals reviewed.   ED Course  Procedures (including critical care time) Labs Review Labs Reviewed  BASIC METABOLIC PANEL - Abnormal; Notable for the following:    Potassium 3.4 (*)    Glucose, Bld 111 (*)    All other components within normal limits  URINALYSIS, ROUTINE W REFLEX MICROSCOPIC (NOT AT [facility]) - Abnormal; Notable for the following:    Color, Urine RED (*)    APPearance TURBID (*)    Hgb urine dipstick LARGE (*)    Bilirubin Urine MODERATE (*)    Protein, ur >300 (*)    Leukocytes, UA SMALL (*)    All other components within normal limits  URINE MICROSCOPIC-ADD ON - Abnormal; Notable for the following:    Bacteria, UA FEW (*)    Crystals CA OXALATE CRYSTALS (*)    All other components within normal limits  CULTURE, BLOOD (ROUTINE X 2)  CULTURE, BLOOD (ROUTINE X 2)  URINE CULTURE  CBC  TROPONIN I  BRAIN NATRIURETIC PEPTIDE  I-STAT CG4 LACTIC ACID, ED  I-STAT CG4 LACTIC ACID, ED    Imaging Review Dg Chest 2 View  07/19/2015   CLINICAL DATA:  Pt has been on antibiotics since 7/7 for UTI. pt states urgency to pee but cant go. Given repeat antibiotics back to back. Symptoms continue. Pt states chest pain since yesterday with heaviness. Hx of HTN, BPH, CVA with other degenerative basal ganglia diseases, and dementia.   EXAM: CHEST  2 VIEW  COMPARISON:  12/16/2014  FINDINGS: Cardiac silhouette is normal in size and configuration. The aorta is uncoiled and tortuous. No mediastinal or hilar masses or evidence of adenopathy.  Lungs are mildly hyperexpanded but clear. No pleural effusion  or pneumothorax.  Bony thorax is intact.  IMPRESSION: No acute cardiopulmonary disease.   Electronically Signed   By: Lajean Manes M.D.   On: 07/19/2015 19:08   Ct Renal Stone Study  07/20/2015   CLINICAL DATA:  Acute onset of lower back pain for 2 days. Hematuria. Initial encounter.  EXAM: CT ABDOMEN AND PELVIS WITHOUT CONTRAST  TECHNIQUE: Multidetector CT imaging of the abdomen and pelvis was performed following the standard protocol without IV contrast.  COMPARISON:  CT of the abdomen and pelvis from 11/01/2013  FINDINGS: The visualized lung bases are clear.  The liver and spleen are unremarkable in appearance. The gallbladder is within normal limits. The pancreas and adrenal glands are unremarkable.  Mild nonspecific perinephric stranding is noted bilaterally. The kidneys are otherwise unremarkable. There is no evidence of hydronephrosis. No renal or ureteral stones are seen.  No free fluid is identified. The small bowel is unremarkable in appearance. The stomach is within normal limits. No acute vascular abnormalities are seen. Mild scattered calcification is noted along the abdominal aorta and its branches.  The appendix is normal in caliber and contains air, without evidence of appendicitis. The colon is unremarkable in appearance.  The bladder is mildly distended. Mild bladder wall thickening could reflect mild cystitis, or depending on the patient's symptoms, cystoscopy could be considered to exclude a mass. A large 2.1 cm stone is noted at the right side of the base of the bladder. The prostate is borderline enlarged, measuring 4.8 cm in transverse dimension. No inguinal lymphadenopathy is seen.  No acute osseous abnormalities are  identified.  IMPRESSION: 1. Mild bladder wall thickening could reflect mild cystitis, or depending on the patient's symptoms, cystoscopy could be considered to exclude a mass. 2. Large 2.1 cm right bladder stone noted. 3. Borderline enlarged prostate. 4. Mild scattered calcification along the abdominal aorta and its branches.   Electronically Signed   By: Garald Balding M.D.   On: 07/20/2015 00:05   I, Jillian Pianka, Jarrett Soho, personally reviewed and evaluated these images and lab results as part of my medical decision-making.   EKG Interpretation   Date/Time:  Monday July 19 2015 18:20:55 EDT Ventricular Rate:  89 PR Interval:  85 QRS Duration: 93 QT Interval:  348 QTC Calculation: 423 R Axis:   26 Text Interpretation:  Sinus rhythm Atrial premature complexes Short PR  interval Abnormal R-wave progression, early transition Baseline wander in  lead(s) V2 PACs new, no other significant changes since 11/23/14 Confirmed  by [facility] MD, Corene Cornea [phone]) on 07/19/2015 6:26:16 PM      MDM   Final diagnoses:  Cystitis  Nausea without vomiting  Weakness generalized  Urinary bladder stone  Parkinson's disease  Chest pain, unspecified chest pain type   Barbette Or presents with generalized weakness, persistent UTI and chest pain.  Chest pain for 2 days with nonischemic EKG and negative troponin. Urinalysis with hemoglobin and evidence of persistent infection. Will send urine culture and begin IV antibiotics.  10:59 PM Bladder scan with 22mL.  Pt with increasing back pain.  Will obtain CT renal and give vicodin.    1:06 AM CT scan with mild bladder wall thickening potentially reflection of cystitis. Given patient's history of a degree with this. Patient also is a large 2.1 cm right bladder stone and a borderline enlarged prostate.  Pt reports resolved CP.  He continues to have nausea without vomitng.    1:10 AM Discussed with Dr. Posey Pronto who will admit to tele.  Jarrett Soho Nayan Proch,  PA-C 07/20/15 [id number]  Forde Dandy, MD 07/20/15 [phone]

## 2015-07-20 ENCOUNTER — Encounter (HOSPITAL_COMMUNITY): Payer: Self-pay | Admitting: *Deleted

## 2015-07-20 DIAGNOSIS — G9341 Metabolic encephalopathy: Secondary | ICD-10-CM | POA: Diagnosis present

## 2015-07-20 DIAGNOSIS — F3160 Bipolar disorder, current episode mixed, unspecified: Secondary | ICD-10-CM | POA: Diagnosis not present

## 2015-07-20 DIAGNOSIS — F0390 Unspecified dementia without behavioral disturbance: Secondary | ICD-10-CM

## 2015-07-20 DIAGNOSIS — N210 Calculus in bladder: Secondary | ICD-10-CM | POA: Diagnosis present

## 2015-07-20 DIAGNOSIS — N138 Other obstructive and reflux uropathy: Secondary | ICD-10-CM | POA: Diagnosis present

## 2015-07-20 DIAGNOSIS — I639 Cerebral infarction, unspecified: Secondary | ICD-10-CM

## 2015-07-20 DIAGNOSIS — G20 Parkinson's disease: Secondary | ICD-10-CM | POA: Diagnosis present

## 2015-07-20 DIAGNOSIS — R112 Nausea with vomiting, unspecified: Secondary | ICD-10-CM | POA: Diagnosis present

## 2015-07-20 DIAGNOSIS — Z79899 Other long term (current) drug therapy: Secondary | ICD-10-CM | POA: Diagnosis not present

## 2015-07-20 DIAGNOSIS — Z8673 Personal history of transient ischemic attack (TIA), and cerebral infarction without residual deficits: Secondary | ICD-10-CM

## 2015-07-20 DIAGNOSIS — E876 Hypokalemia: Secondary | ICD-10-CM

## 2015-07-20 DIAGNOSIS — F319 Bipolar disorder, unspecified: Secondary | ICD-10-CM | POA: Diagnosis present

## 2015-07-20 DIAGNOSIS — N3090 Cystitis, unspecified without hematuria: Secondary | ICD-10-CM

## 2015-07-20 DIAGNOSIS — I472 Ventricular tachycardia: Secondary | ICD-10-CM

## 2015-07-20 DIAGNOSIS — N401 Enlarged prostate with lower urinary tract symptoms: Secondary | ICD-10-CM | POA: Diagnosis present

## 2015-07-20 DIAGNOSIS — N10 Acute tubulo-interstitial nephritis: Secondary | ICD-10-CM | POA: Diagnosis present

## 2015-07-20 DIAGNOSIS — Z79891 Long term (current) use of opiate analgesic: Secondary | ICD-10-CM | POA: Diagnosis not present

## 2015-07-20 DIAGNOSIS — E538 Deficiency of other specified B group vitamins: Secondary | ICD-10-CM | POA: Diagnosis present

## 2015-07-20 DIAGNOSIS — I4729 Other ventricular tachycardia: Secondary | ICD-10-CM

## 2015-07-20 DIAGNOSIS — I10 Essential (primary) hypertension: Secondary | ICD-10-CM

## 2015-07-20 DIAGNOSIS — M1990 Unspecified osteoarthritis, unspecified site: Secondary | ICD-10-CM | POA: Diagnosis present

## 2015-07-20 DIAGNOSIS — N3091 Cystitis, unspecified with hematuria: Secondary | ICD-10-CM | POA: Diagnosis present

## 2015-07-20 DIAGNOSIS — G894 Chronic pain syndrome: Secondary | ICD-10-CM | POA: Diagnosis present

## 2015-07-20 DIAGNOSIS — F17210 Nicotine dependence, cigarettes, uncomplicated: Secondary | ICD-10-CM | POA: Diagnosis present

## 2015-07-20 DIAGNOSIS — K219 Gastro-esophageal reflux disease without esophagitis: Secondary | ICD-10-CM

## 2015-07-20 DIAGNOSIS — R079 Chest pain, unspecified: Secondary | ICD-10-CM

## 2015-07-20 DIAGNOSIS — I509 Heart failure, unspecified: Secondary | ICD-10-CM | POA: Diagnosis present

## 2015-07-20 DIAGNOSIS — E785 Hyperlipidemia, unspecified: Secondary | ICD-10-CM | POA: Diagnosis present

## 2015-07-20 DIAGNOSIS — N139 Obstructive and reflux uropathy, unspecified: Secondary | ICD-10-CM | POA: Diagnosis present

## 2015-07-20 DIAGNOSIS — N400 Enlarged prostate without lower urinary tract symptoms: Secondary | ICD-10-CM

## 2015-07-20 DIAGNOSIS — F209 Schizophrenia, unspecified: Secondary | ICD-10-CM | POA: Diagnosis present

## 2015-07-20 DIAGNOSIS — K5900 Constipation, unspecified: Secondary | ICD-10-CM | POA: Diagnosis present

## 2015-07-20 DIAGNOSIS — F419 Anxiety disorder, unspecified: Secondary | ICD-10-CM | POA: Diagnosis present

## 2015-07-20 DIAGNOSIS — R627 Adult failure to thrive: Secondary | ICD-10-CM | POA: Diagnosis present

## 2015-07-20 DIAGNOSIS — R531 Weakness: Secondary | ICD-10-CM

## 2015-07-20 DIAGNOSIS — R630 Anorexia: Secondary | ICD-10-CM | POA: Diagnosis present

## 2015-07-20 LAB — COMPREHENSIVE METABOLIC PANEL
ALT: 14 U/L — ABNORMAL LOW (ref 17–63)
ANION GAP: 7 (ref 5–15)
AST: 16 U/L (ref 15–41)
Albumin: 3.8 g/dL (ref 3.5–5.0)
Alkaline Phosphatase: 85 U/L (ref 38–126)
BUN: 9 mg/dL (ref 6–20)
CALCIUM: 8.8 mg/dL — AB (ref 8.9–10.3)
CHLORIDE: 107 mmol/L (ref 101–111)
CO2: 28 mmol/L (ref 22–32)
Creatinine, Ser: 0.83 mg/dL (ref 0.61–1.24)
GFR calc non Af Amer: >60 mL/min (ref >60)
Glucose, Bld: 94 mg/dL (ref 65–99)
POTASSIUM: 3.3 mmol/L — AB (ref 3.5–5.1)
SODIUM: 142 mmol/L (ref 135–145)
Total Bilirubin: 1 mg/dL (ref 0.3–1.2)
Total Protein: 6.3 g/dL — ABNORMAL LOW (ref 6.5–8.1)

## 2015-07-20 LAB — CBC
HEMATOCRIT: 42.2 % (ref 39.0–52.0)
HEMOGLOBIN: 14.6 g/dL (ref 13.0–17.0)
MCH: 31 pg (ref 26.0–34.0)
MCHC: 34.6 g/dL (ref 30.0–36.0)
MCV: 89.6 fL (ref 78.0–100.0)
Platelets: 213 10*3/uL (ref 150–400)
RBC: 4.71 MIL/uL (ref 4.22–5.81)
RDW: 12.8 % (ref 11.5–15.5)
WBC: 6.9 10*3/uL (ref 4.0–10.5)

## 2015-07-20 LAB — TROPONIN I: Troponin I: <0.03 ng/mL (ref <0.031)

## 2015-07-20 LAB — PROTIME-INR
INR: 1.21 (ref 0.00–1.49)
PROTHROMBIN TIME: 15.4 s — AB (ref 11.6–15.2)

## 2015-07-20 LAB — BRAIN NATRIURETIC PEPTIDE: B Natriuretic Peptide: 42.1 pg/mL (ref 0.0–100.0)

## 2015-07-20 LAB — MAGNESIUM: MAGNESIUM: 2 mg/dL (ref 1.7–2.4)

## 2015-07-20 LAB — I-STAT CG4 LACTIC ACID, ED: LACTIC ACID, VENOUS: 0.83 mmol/L (ref 0.5–2.0)

## 2015-07-20 MED ORDER — ALPRAZOLAM 0.5 MG PO TABS
0.5000 mg | ORAL_TABLET | Freq: Three times a day (TID) | ORAL | Status: DC | PRN
  Administered 2015-07-20 – 2015-07-22 (×4): 0.5 mg via ORAL
  Filled 2015-07-20 (×4): qty 1

## 2015-07-20 MED ORDER — ASPIRIN 81 MG PO CHEW
324.0000 mg | CHEWABLE_TABLET | Freq: Once | ORAL | Status: AC
  Administered 2015-07-20: 324 mg via ORAL
  Filled 2015-07-20: qty 4

## 2015-07-20 MED ORDER — ACETAMINOPHEN 325 MG PO TABS: 650.0000 mg | ORAL_TABLET | Freq: Four times a day (QID) | ORAL | Status: DC | PRN

## 2015-07-20 MED ORDER — ONDANSETRON HCL 4 MG/2ML IJ SOLN: 4.0000 mg | Freq: Three times a day (TID) | INTRAMUSCULAR | Status: DC | PRN

## 2015-07-20 MED ORDER — ATORVASTATIN CALCIUM 10 MG PO TABS
10.0000 mg | ORAL_TABLET | Freq: Every day | ORAL | Status: DC
  Administered 2015-07-20 – 2015-07-21 (×2): 10 mg via ORAL
  Filled 2015-07-20 (×3): qty 1

## 2015-07-20 MED ORDER — BISACODYL 5 MG PO TBEC
15.0000 mg | DELAYED_RELEASE_TABLET | Freq: Every day | ORAL | Status: DC | PRN
  Administered 2015-07-21: 15 mg via ORAL
  Filled 2015-07-20 (×3): qty 3

## 2015-07-20 MED ORDER — MIRTAZAPINE 15 MG PO TABS
15.0000 mg | ORAL_TABLET | Freq: Every day | ORAL | Status: DC
  Administered 2015-07-20 – 2015-07-21 (×3): 15 mg via ORAL
  Filled 2015-07-20 (×3): qty 1

## 2015-07-20 MED ORDER — ACETAMINOPHEN 650 MG RE SUPP: 650.0000 mg | Freq: Four times a day (QID) | RECTAL | Status: DC | PRN

## 2015-07-20 MED ORDER — POTASSIUM CHLORIDE CRYS ER 20 MEQ PO TBCR
20.0000 meq | EXTENDED_RELEASE_TABLET | Freq: Two times a day (BID) | ORAL | Status: AC
  Administered 2015-07-20 (×2): 20 meq via ORAL
  Filled 2015-07-20 (×2): qty 1

## 2015-07-20 MED ORDER — PANTOPRAZOLE SODIUM 40 MG PO TBEC
40.0000 mg | DELAYED_RELEASE_TABLET | Freq: Every day | ORAL | Status: DC
  Administered 2015-07-20 – 2015-07-22 (×3): 40 mg via ORAL
  Filled 2015-07-20 (×3): qty 1

## 2015-07-20 MED ORDER — OXYCODONE HCL 5 MG PO TABS
5.0000 mg | ORAL_TABLET | ORAL | Status: DC | PRN
  Administered 2015-07-20 – 2015-07-22 (×6): 5 mg via ORAL
  Filled 2015-07-20 (×6): qty 1

## 2015-07-20 MED ORDER — AMLODIPINE BESYLATE 10 MG PO TABS
10.0000 mg | ORAL_TABLET | Freq: Every day | ORAL | Status: DC
  Administered 2015-07-20 – 2015-07-22 (×3): 10 mg via ORAL
  Filled 2015-07-20 (×4): qty 1

## 2015-07-20 MED ORDER — ONDANSETRON HCL 4 MG PO TABS
4.0000 mg | ORAL_TABLET | Freq: Four times a day (QID) | ORAL | Status: DC | PRN
  Administered 2015-07-21 – 2015-07-22 (×4): 4 mg via ORAL
  Filled 2015-07-20 (×4): qty 1

## 2015-07-20 MED ORDER — PHENAZOPYRIDINE HCL 100 MG PO TABS
100.0000 mg | ORAL_TABLET | Freq: Three times a day (TID) | ORAL | Status: DC
Start: 2015-07-20 — End: 2015-07-22
  Administered 2015-07-20 – 2015-07-22 (×9): 100 mg via ORAL
  Filled 2015-07-20 (×10): qty 1

## 2015-07-20 MED ORDER — ONDANSETRON HCL 4 MG/2ML IJ SOLN
4.0000 mg | Freq: Four times a day (QID) | INTRAMUSCULAR | Status: DC | PRN
  Administered 2015-07-20 (×2): 4 mg via INTRAVENOUS
  Filled 2015-07-20 (×2): qty 2

## 2015-07-20 MED ORDER — TAMSULOSIN HCL 0.4 MG PO CAPS
0.4000 mg | ORAL_CAPSULE | Freq: Every day | ORAL | Status: DC
  Administered 2015-07-20 – 2015-07-21 (×2): 0.4 mg via ORAL
  Filled 2015-07-20 (×3): qty 1

## 2015-07-20 MED ORDER — SODIUM CHLORIDE 0.9 % IV SOLN
INTRAVENOUS | Status: DC
  Administered 2015-07-20 – 2015-07-21 (×4): via INTRAVENOUS

## 2015-07-20 MED ORDER — SODIUM CHLORIDE 0.9 % IJ SOLN
3.0000 mL | Freq: Two times a day (BID) | INTRAMUSCULAR | Status: DC
  Administered 2015-07-20 – 2015-07-21 (×3): 3 mL via INTRAVENOUS

## 2015-07-20 MED ORDER — DEXTROSE 5 % IV SOLN
1.0000 g | INTRAVENOUS | Status: DC
  Administered 2015-07-21: 1 g via INTRAVENOUS
  Filled 2015-07-20: qty 10

## 2015-07-20 MED ORDER — TRAMADOL HCL 50 MG PO TABS: 50.0000 mg | ORAL_TABLET | Freq: Three times a day (TID) | ORAL | Status: DC | PRN

## 2015-07-20 MED ORDER — ONDANSETRON HCL 4 MG/2ML IJ SOLN
4.0000 mg | Freq: Once | INTRAMUSCULAR | Status: AC
  Administered 2015-07-20: 4 mg via INTRAVENOUS
  Filled 2015-07-20: qty 2

## 2015-07-20 NOTE — Progress Notes (Signed)
Pt had 7 beats of Vtach while laying in bed. Asymptomatic. MD aware. Will continue to monitor.

## 2015-07-20 NOTE — Progress Notes (Signed)
ANTIBIOTIC CONSULT NOTE - INITIAL  Pharmacy Consult for Ceftriaxone Indication: UTI  No Known Allergies  Patient Measurements:     Vital Signs: Temp: 98.7 F (37.1 C) (08/16 0045) Temp Source: Rectal (08/16 0045) BP: 155/89 mmHg (08/16 0045) Pulse Rate: 87 (08/16 0045) Intake/Output from previous day: 08/15 0701 - 08/16 0700 In: -  Out: 80 [Urine:80] Intake/Output from this shift: Total I/O In: -  Out: 80 [Urine:80]  Labs:  Recent Labs  07/19/15 1843  WBC 9.6  HGB 15.3  PLT 229  CREATININE 1.06   CrCl cannot be calculated (Unknown ideal weight.). No results for input(s): VANCOTROUGH, VANCOPEAK, VANCORANDOM, GENTTROUGH, GENTPEAK, GENTRANDOM, TOBRATROUGH, TOBRAPEAK, TOBRARND, AMIKACINPEAK, AMIKACINTROU, AMIKACIN in the last 72 hours.   Microbiology: No results found for this or any previous visit (from the past 720 hour(s)).  Medical History: Past Medical History  Diagnosis Date  . Hyperlipidemia   . Vitamin B12 deficiency   . Occasional tremors     leg tremors  . BPH (benign prostatic hypertrophy) with urinary obstruction   . History of CVA (cerebrovascular accident) without residual deficits     2011  . Other degenerative diseases of the basal ganglia     S/P CVA 2011--  CHRONIC LEFT BASAL GANGLIA LACUNAR INFARCT  PER CT  . History of nonmelanoma skin cancer     EXCISION OF NOSE  . Hypertension   . Bladder stone   . Schizophrenia     HX PSYCHIATRIC ADMISSION'S  AND ECT (SHOCK) TX  . Bipolar affective disorder   . ED (erectile dysfunction) of organic origin   . Dementia   . Depression   . Constipation   . Generalized weakness     LOWER EXTREMITIES  . Chronic anxiety   . Urinary bladder stone 08/12/2014    Medications:  Scheduled:  . amLODipine  10 mg Oral Daily  . aspirin  324 mg Oral Once  . mirtazapine  15 mg Oral QHS  . pantoprazole  40 mg Oral Daily  . phenazopyridine  100 mg Oral TID WC  . sodium chloride  3 mL Intravenous Q12H  .  tamsulosin  0.4 mg Oral QPC supper   Infusions:  . sodium chloride    . cefTRIAXone (ROCEPHIN)  IV     Assessment:  69 yr with complaints of UTI symptoms, chest pain and a swollen ankle.  Recent UTI which was treated with Cipro and then doxycycline (completed ABX on 8/14).  Urine and blood cultures ordered  Ceftriaxone 1gm x 1 given in ED and pharmacy consulted to continue dosing upon admission  Goal of Therapy:  Eradication of infection  Plan:  Follow up culture results  Ceftriaxone 1gm IV q24h  Albert Devaul, Toribio Harbour, PharmD 07/20/2015,2:15 AM

## 2015-07-20 NOTE — H&P (Signed)
[facility] History and Physical  Patient: [name]  MRN: [id number]  DOB: [birth year]  DOS: the patient was seen and examined on 07/20/2015 PCP: Cathlean Cower, MD  Referring physician: Dtr Tiger Chief Complaint:  Chest pain  HPI: [name] is a 69 y.o. male with Past medical history of  Dementia, CVA, bipolar disorder, depression, bladder stone , hypertension.  the patient was brought in by patient's wife who report of the patient has been feeling weak and lethargic since last few days.  as per her the patient has been on 2 antibiotics courses ciprofloxacin as well as doxycycline doxycycline being the most recent one 4 UTI as an outpatient and has not been feeling better. He continues to have nausea as well as episodes of vomiting. He has decreased appetite and has not been able to eat anything since last weekend. No complaints of fever or chills headache fall trauma or injury. Patient started having complaints of chest pain which is located on the left side and felt like tightness. The patient's pain is completely resolved at present but it was continuous since onset. The pain does not radiate anywhere else. Patient denies any pain at the time of my evaluation.  The patient is coming from home.  At his baseline ambulates  With walker And is independent for most of his ADL manages her medication on his own.  Review of Systems: as mentioned in the history of present illness.  A comprehensive review of the other systems is negative.  Past Medical History  Diagnosis Date  . Hyperlipidemia   . Vitamin B12 deficiency   . Occasional tremors     leg tremors  . BPH (benign prostatic hypertrophy) with urinary obstruction   . History of CVA (cerebrovascular accident) without residual deficits     2011  . Other degenerative diseases of the basal ganglia     S/P CVA 2011--  CHRONIC LEFT BASAL GANGLIA LACUNAR INFARCT  PER CT  . History of nonmelanoma skin cancer     EXCISION  OF NOSE  . Hypertension   . Bladder stone   . Schizophrenia     HX PSYCHIATRIC ADMISSION'S  AND ECT ([place]) [place]  . Bipolar affective disorder   . ED (erectile dysfunction) of organic origin   . Dementia   . Depression   . Constipation   . Generalized weakness     LOWER EXTREMITIES  . Chronic anxiety   . Urinary bladder stone 08/12/2014   Past Surgical History  Procedure Laterality Date  . Transanal excision anal and rectal polyps  11-02-2000  . Transthoracic echocardiogram  02-03-2012    GRADE I DIASTOLIC DYSFUNCTION/  EF 60-65%  . Cardiovascular stress test  06-18-2013  DR Jenkins [place]    LOW RISK NO EXERCISE LEXISCAN STUDY/  NO ISCHEMIA, CANNOT RULE OUT BASAL INFERIOR INFARCTION BUT POSSIBLE ARTIFACT GIVEN NORMAL WALL MOTION/  EF 73%  . Excision tumor upper left thigh  2012  . Transurethral incision of prostate N/A 10/03/2013    Procedure: TRANSURETHRAL INCISION OF THE PROSTATE (TUIP);  Surgeon: Claybon Jabs, MD;  Location: [facility];  Service: Urology;  Laterality: N/A;  . Cystoscopy with litholapaxy N/A 10/03/2013    Procedure: CYSTOSCOPY WITH LITHOLAPAXY;  Surgeon: Claybon Jabs, MD;  Location: [facility];  Service: Urology;  Laterality: N/A;  . Holmium laser application N/A [id number]    Procedure: HOLMIUM LASER APPLICATION;  Surgeon: Claybon Jabs, MD;  Location: [facility];  Service: Urology;  Laterality: N/A;   Social History:  reports that he has been smoking Cigarettes.  He has a 24 pack-year smoking history. He has never used smokeless tobacco. He reports that he does not drink alcohol or use illicit drugs.  No Known Allergies  Family History  Problem Relation Age of Onset  . Dementia Father   . Dementia Mother   . Colon cancer Neg Hx   . Colon polyps Neg Hx   . Rectal cancer Neg Hx   . Stomach cancer Neg Hx     Prior to Admission medications   Medication Sig Start Date End Date Taking? Authorizing Provider    ALPRAZolam Duanne Moron) 0.5 MG tablet TAKE 1 TABLET BY MOUTH THREE TIMES DAILY AS NEEDED FOR ANXIETY 06/02/15  Yes Biagio Borg, MD  amLODipine (NORVASC) 10 MG tablet Take 1 tablet (10 mg total) by mouth daily. 11/25/14  Yes Reyne Dumas, MD  Flaxseed, Linseed, (FLAXSEED OIL PO) Take 2 capsules by mouth daily.   Yes Historical Provider, MD  mirtazapine (REMERON) 15 MG tablet Take 1 tablet (15 mg total) by mouth at bedtime. 06/09/15  Yes Biagio Borg, MD  omega-3 acid ethyl esters (LOVAZA) 1 G capsule Take 3 g by mouth daily.   Yes Historical Provider, MD  ondansetron (ZOFRAN) 4 MG tablet TAKE 1 TABLET BY MOUTH EVERY 8 HOURS AS NEEDED FOR NAUSEA OR VOMITING 07/14/15  Yes Biagio Borg, MD  pantoprazole (PROTONIX) 40 MG tablet Take 1 tablet (40 mg total) by mouth daily. 06/09/15  Yes Biagio Borg, MD  promethazine (PHENERGAN) 12.5 MG tablet Take 1 tablet (12.5 mg total) by mouth every 8 (eight) hours as needed for nausea or vomiting. 06/09/15  Yes Biagio Borg, MD  atorvastatin (LIPITOR) 10 MG tablet  08/12/14   Historical Provider, MD  bisacodyl (DULCOLAX) 5 MG EC tablet Take 15 mg by mouth daily as needed (For constipation.).     Historical Provider, MD  carbidopa-levodopa (SINEMET IR) 25-100 MG per tablet Take 1 tablet by mouth 3 (three) times daily. Patient not taking: Reported on 07/19/2015 06/09/15   Biagio Borg, MD  ciprofloxacin (CIPRO) 250 MG tablet Take 1 tablet (250 mg total) by mouth 2 (two) times daily. Patient not taking: Reported on 07/19/2015 06/10/15   Biagio Borg, MD  fesoterodine (TOVIAZ) 8 MG TB24 tablet Take 8 mg by mouth daily.    Historical Provider, MD  Omega-3 Fatty Acids (FISH OIL) 1200 MG CAPS Take 2,400 mg by mouth daily.    Historical Provider, MD  polyethylene glycol powder (MIRALAX) powder Take 17 g by mouth daily. Patient not taking: Reported on 06/09/2015 08/17/14   Janece Canterbury, MD  tamsulosin (FLOMAX) 0.4 MG CAPS capsule Take 1 capsule (0.4 mg total) by mouth daily after supper.  03/10/15   Biagio Borg, MD  traMADol (ULTRAM) 50 MG tablet Take 1 tablet (50 mg total) by mouth every 8 (eight) hours as needed. Patient taking differently: Take 50 mg by mouth every 8 (eight) hours as needed for moderate pain.  06/09/15   Biagio Borg, MD  vitamin B-12 1000 MCG tablet Take 1 tablet (1,000 mcg total) by mouth daily. Patient not taking: Reported on 06/09/2015 08/17/14   Janece Canterbury, MD    Physical Exam: Filed Vitals:   07/19/15 2103 07/19/15 2207 07/20/15 0045 07/20/15 0345  BP: 147/88 152/78 155/89 156/96  Pulse: 89 92 87 81  Temp: 98.5 F (36.9 C)  99.5 F (37.5 C) 98.7 F (37.1 C) 98.6 F (37 C)  TempSrc: Oral Rectal Rectal Oral  Resp: 17 14 11 12   Height:    6\' 1"  (1.854 m)  Weight:    87.3 kg (192 lb 7.4 oz)  SpO2: 97% 95% 96% 97%    General: Alert, Awake and Oriented to Time, Place and Person. Appear in mild distress Eyes: PERRL ENT: Oral Mucosa clear moist. Neck: no JVD Cardiovascular: S1 and S2 Present, no Murmur, Peripheral Pulses Present Respiratory: Bilateral Air entry equal and Decreased,  Clear to Auscultation, no Crackles, no wheezes Abdomen: Bowel Sound present, Soft and no tenderness Skin: no Rash Extremities:  trace Pedal edema, no calf tenderness Neurologic: Grossly no focal neuro deficit.  Labs on Admission:  CBC:  Recent Labs Lab 07/19/15 1843  WBC 9.6  HGB 15.3  HCT 43.9  MCV 89.8  PLT 229    CMP     Component Value Date/Time   NA 137 07/19/2015 1843   K 3.4* 07/19/2015 1843   CL 103 07/19/2015 1843   CO2 25 07/19/2015 1843   GLUCOSE 111* 07/19/2015 1843   BUN 12 07/19/2015 1843   CREATININE 1.06 07/19/2015 1843   CALCIUM 9.2 07/19/2015 1843   PROT 6.4 06/10/2015 1338   ALBUMIN 4.0 06/10/2015 1338   AST 13 06/10/2015 1338   ALT 13 06/10/2015 1338   ALKPHOS 88 06/10/2015 1338   BILITOT 0.8 06/10/2015 1338   GFRNONAA >60 07/19/2015 1843   GFRAA >60 07/19/2015 1843    No results for input(s): LIPASE, AMYLASE in the  last 168 hours.   Recent Labs Lab 07/19/15 2113 07/20/15 0220  TROPONINI <0.03 <0.03   BNP (last 3 results)  Recent Labs  07/19/15 2138  BNP 42.1    ProBNP (last 3 results) No results for input(s): PROBNP in the last 8760 hours.   Radiological Exams on Admission: Dg Chest 2 View  07/19/2015   CLINICAL DATA:  Pt has been on antibiotics since 7/7 for UTI. pt states urgency to pee but cant go. Given repeat antibiotics back to back. Symptoms continue. Pt states chest pain since yesterday with heaviness. Hx of HTN, BPH, CVA with other degenerative basal ganglia diseases, and dementia.  EXAM: CHEST  2 VIEW  COMPARISON:  12/16/2014  FINDINGS: Cardiac silhouette is normal in size and configuration. The aorta is uncoiled and tortuous. No mediastinal or hilar masses or evidence of adenopathy.  Lungs are mildly hyperexpanded but clear. No pleural effusion or pneumothorax.  Bony thorax is intact.  IMPRESSION: No acute cardiopulmonary disease.   Electronically Signed   By: Lajean Manes M.D.   On: 07/19/2015 19:08   Ct Renal Stone Study  07/20/2015   CLINICAL DATA:  Acute onset of lower back pain for 2 days. Hematuria. Initial encounter.  EXAM: CT ABDOMEN AND PELVIS WITHOUT CONTRAST  TECHNIQUE: Multidetector CT imaging of the abdomen and pelvis was performed following the standard protocol without IV contrast.  COMPARISON:  CT of the abdomen and pelvis from 11/01/2013  FINDINGS: The visualized lung bases are clear.  The liver and spleen are unremarkable in appearance. The gallbladder is within normal limits. The pancreas and adrenal glands are unremarkable.  Mild nonspecific perinephric stranding is noted bilaterally. The kidneys are otherwise unremarkable. There is no evidence of hydronephrosis. No renal or ureteral stones are seen.  No free fluid is identified. The small bowel is unremarkable in appearance. The stomach is within normal limits. No acute vascular abnormalities  are seen. Mild scattered  calcification is noted along the abdominal aorta and its branches.  The appendix is normal in caliber and contains air, without evidence of appendicitis. The colon is unremarkable in appearance.  The bladder is mildly distended. Mild bladder wall thickening could reflect mild cystitis, or depending on the patient's symptoms, cystoscopy could be considered to exclude a mass. A large 2.1 cm stone is noted at the right side of the base of the bladder. The prostate is borderline enlarged, measuring 4.8 cm in transverse dimension. No inguinal lymphadenopathy is seen.  No acute osseous abnormalities are identified.  IMPRESSION: 1. Mild bladder wall thickening could reflect mild cystitis, or depending on the patient's symptoms, cystoscopy could be considered to exclude a mass. 2. Large 2.1 cm right bladder stone noted. 3. Borderline enlarged prostate. 4. Mild scattered calcification along the abdominal aorta and its branches.   Electronically Signed   By: Garald Balding M.D.   On: 07/20/2015 00:05   EKG: Independently reviewed. normal sinus rhythm, nonspecific ST and T waves changes.  Assessment/Plan Principal Problem:   Obstructed, uropathy Active Problems:   Dementia   HTN (hypertension)   Stroke   GERD (gastroesophageal reflux disease)   Bipolar affective disorder   Urinary bladder stone   Parkinson's disease   Cystitis   1. Obstructed, uropathy  the patient is presenting with complains of chest pain as well as increasing urinary frequency with nausea and vomiting.  scan shows that patient has large bladder stone with possible cystitis. He also has evidence of perinephric stranding. Next and with this the patient appears to be having mild pyelonephritis secondary to urinary stone. Patient may require urologic consultation in the morning or as an outpatient. Currently I will treat him with ceftriaxone. Monitor cultures.  2. Chest pain. Less likely cardiac in the region. He continues to monitor  him on telemetry. Monitor serial troponin. Echogram in the morning. Patient remains nothing by mouth after midnight.  3. History of hypertension. Continuing home medications.  4. History of BPH. Continuing home medications.  5. History of Parkinson's disease. The patient is not taking Sinemet anymore. We'll continue to closely monitor.  Advance goals of care discussion:  Full code as per my discussion with patient's wife.   DVT Prophylaxis: subcutaneous Heparin Nutrition:  Nothing by mouth  Family Communication: family was present at bedside, opportunity was given to ask question and all questions were answered satisfactorily at the time of interview. Disposition: Admitted as observation, telemetry unit.  Author: Berle Mull, MD [facility] Pager: [phone] 07/20/2015  If 7PM-7AM, please contact night-coverage [email or web address] Password [email or web address]

## 2015-07-20 NOTE — Consult Note (Addendum)
Urology Consult   Physician requesting consult: Dr. Jacquelynn Cree, MD  Reason for consult: cystolithiasis and cystitis  History of Present Illness: [name] is a 69 y.o. M with history of demetia, CVA, bipolar disorder, HTN, LUTS and history of bladder stones who presented to the [facility] ED 07/20/15 with failure to thrive including nausea and emesis with significantly decreased PO intake. This has been getting gradually worse since he was last seen in Urology clinic with Dr. Karsten Ro 07/08/15 at which point a urine culture grew out >100 k coag neg staph sensitive to doxycylcine which he has been taking. No fevers or chills, however, developed left sided chest tightness without radiation. Serum labs were within the normal range with Cr at 0.83 and WBC 6,000. Urinalysis demonstrated mild infection parameters as well as CaOx crystals. CT stone protocol demonstrated mild cystitis and a 2.1 cm bladder stone with prostatic enlargement. No evidence of hydronephrosis and "mild perinephric stranding" which is not impressive. He was admitted to the hospitalist service for management of multiple medical issues. Blood and urine cultures pending, troponins negative.  He was last seen in clinic by Dr. Karsten Ro 07/07/15 at which time he was seen again for recurrent urinary tract infections although no prior positive cultures were found. He was also having persistent LUTS with incontinence despite medical management with Lisbeth Ply and Myrbetriq. 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 He has had persistent vague pelvic pain over the last 2 months. Close follow-up was planned for next week with plans for cystoscopy to evaluate for likely bladder calculus. Prior to admission he was having issues voiding and arrived at the ED in retention. Foley catheter was placed on the floor with some relief. He had completed total 14 day course of doxycyline prior to  admission.  Past Medical History  Diagnosis Date  . Hyperlipidemia   . Vitamin B12 deficiency   . Occasional tremors     leg tremors  . BPH (benign prostatic hypertrophy) with urinary obstruction   . History of CVA (cerebrovascular accident) without residual deficits     2011  . Other degenerative diseases of the basal ganglia     S/P CVA 2011--  CHRONIC LEFT BASAL GANGLIA LACUNAR INFARCT  PER CT  . History of nonmelanoma skin cancer     EXCISION OF NOSE  . Hypertension   . Bladder stone   . Schizophrenia     HX PSYCHIATRIC ADMISSION'S  AND ECT ([place]) [place]  . Bipolar affective disorder   . ED (erectile dysfunction) of organic origin   . Dementia   . Depression   . Constipation   . Generalized weakness     LOWER EXTREMITIES  . Chronic anxiety   . Urinary bladder stone 08/12/2014    Past Surgical History  Procedure Laterality Date  . Transanal excision anal and rectal polyps  11-02-2000  . Transthoracic echocardiogram  02-03-2012    GRADE I DIASTOLIC DYSFUNCTION/  EF 60-65%  . Cardiovascular stress test  06-18-2013  DR Jenkins [place]    LOW RISK NO EXERCISE LEXISCAN STUDY/  NO ISCHEMIA, CANNOT RULE OUT BASAL INFERIOR INFARCTION BUT POSSIBLE ARTIFACT GIVEN NORMAL WALL MOTION/  EF 73%  . Excision tumor upper left thigh  2012  . Transurethral incision of prostate N/A 10/03/2013    Procedure: TRANSURETHRAL INCISION OF THE PROSTATE (TUIP);  Surgeon: Claybon Jabs, MD;  Location: [facility];  Service:  Urology;  Laterality: N/A;  . Cystoscopy with litholapaxy N/A 10/03/2013    Procedure: CYSTOSCOPY WITH LITHOLAPAXY;  Surgeon: Claybon Jabs, MD;  Location: [facility];  Service: Urology;  Laterality: N/A;  . Holmium laser application N/A 01/00/7121    Procedure: HOLMIUM LASER APPLICATION;  Surgeon: Claybon Jabs, MD;  Location: [facility];  Service: Urology;  Laterality: N/A;    Current Hospital Medications:  Home Meds:     Medication List    ASK your doctor about these medications        ALPRAZolam 0.5 MG tablet  Commonly known as:  XANAX  TAKE 1 TABLET BY MOUTH THREE TIMES DAILY AS NEEDED FOR ANXIETY     amLODipine 10 MG tablet  Commonly known as:  NORVASC  Take 1 tablet (10 mg total) by mouth daily.     atorvastatin 10 MG tablet  Commonly known as:  LIPITOR     carbidopa-levodopa 25-100 MG per tablet  Commonly known as:  SINEMET IR  Take 1 tablet by mouth 3 (three) times daily.     ciprofloxacin 250 MG tablet  Commonly known as:  CIPRO  Take 1 tablet (250 mg total) by mouth 2 (two) times daily.     cyanocobalamin 1000 MCG tablet  Take 1 tablet (1,000 mcg total) by mouth daily.     DULCOLAX 5 MG EC tablet  Generic drug:  bisacodyl  Take 15 mg by mouth daily as needed (For constipation.).     Fish Oil 1200 MG Caps  Take 2,400 mg by mouth daily.     FLAXSEED OIL PO  Take 2 capsules by mouth daily.     mirtazapine 15 MG tablet  Commonly known as:  REMERON  Take 1 tablet (15 mg total) by mouth at bedtime.     omega-3 acid ethyl esters 1 G capsule  Commonly known as:  LOVAZA  Take 3 g by mouth daily.     ondansetron 4 MG tablet  Commonly known as:  ZOFRAN  TAKE 1 TABLET BY MOUTH EVERY 8 HOURS AS NEEDED FOR NAUSEA OR VOMITING     pantoprazole 40 MG tablet  Commonly known as:  PROTONIX  Take 1 tablet (40 mg total) by mouth daily.     polyethylene glycol powder powder  Commonly known as:  MIRALAX  Take 17 g by mouth daily.     promethazine 12.5 MG tablet  Commonly known as:  PHENERGAN  Take 1 tablet (12.5 mg total) by mouth every 8 (eight) hours as needed for nausea or vomiting.     tamsulosin 0.4 MG Caps capsule  Commonly known as:  FLOMAX  Take 1 capsule (0.4 mg total) by mouth daily after supper.     TOVIAZ 8 MG Tb24 tablet  Generic drug:  fesoterodine  Take 8 mg by mouth daily.     traMADol 50 MG tablet  Commonly known as:  ULTRAM  Take 1 tablet (50 mg total) by  mouth every 8 (eight) hours as needed.        Scheduled Meds: . amLODipine  10 mg Oral Daily  . atorvastatin  10 mg Oral q1800  . cefTRIAXone (ROCEPHIN)  IV  1 g Intravenous Q24H  . mirtazapine  15 mg Oral QHS  . pantoprazole  40 mg Oral Daily  . phenazopyridine  100 mg Oral TID WC  . potassium chloride  20 mEq Oral BID  . sodium chloride  3 mL Intravenous Q12H  .  tamsulosin  0.4 mg Oral QPC supper   Continuous Infusions: . sodium chloride 75 mL/hr at 07/20/15 1154   PRN Meds:.acetaminophen **OR** acetaminophen, ALPRAZolam, bisacodyl, ondansetron **OR** ondansetron (ZOFRAN) IV, oxyCODONE, traMADol  Allergies: No Known Allergies  Family History  Problem Relation Age of Onset  . Dementia Father   . Dementia Mother   . Colon cancer Neg Hx   . Colon polyps Neg Hx   . Rectal cancer Neg Hx   . Stomach cancer Neg Hx     Social History:  reports that he has been smoking Cigarettes.  He has a 24 pack-year smoking history. He has never used smokeless tobacco. He reports that he does not drink alcohol or use illicit drugs.  ROS: A complete review of systems was performed.  All systems are negative except for pertinent findings as noted.  Physical Exam:  Vital signs in last 24 hours: Temp:  [98.5 F (36.9 C)-99.5 F (37.5 C)] 98.7 F (37.1 C) (08/16 1317) Pulse Rate:  [72-92] 72 (08/16 1317) Resp:  [11-17] 12 (08/16 1317) BP: (115-156)/(69-96) 118/69 mmHg (08/16 1409) SpO2:  [95 %-97 %] 97 % (08/16 1317) Weight:  [87.3 kg (192 lb 7.4 oz)] 87.3 kg (192 lb 7.4 oz) (08/16 0345) Constitutional:  Alert and oriented, No acute distress Cardiovascular: Regular rate and rhythm, No JVD Respiratory: Normal respiratory effort, Lungs clear bilaterally GI: Abdomen is soft, nontender, nondistended, no abdominal masses GU: No CVA tenderness, 16 Fr foley catheter in place draining clear,amber colored urine Lymphatic: No lymphadenopathy Neurologic: Grossly intact, no focal  deficits Psychiatric: Normal mood and affect  Laboratory Data:   Recent Labs  07/19/15 1843 07/20/15 0750  WBC 9.6 6.9  HGB 15.3 14.6  HCT 43.9 42.2  PLT 229 213     Recent Labs  07/19/15 1843 07/20/15 0750  NA 137 142  K 3.4* 3.3*  CL 103 107  GLUCOSE 111* 94  BUN 12 9  CALCIUM 9.2 8.8*  CREATININE 1.06 0.83   8/4 UCx coag neg staph sensitive to oxacilin, cefazolin, gent, macrobid, doxy,vanc,rifampin (R to cipro, bactrim, I-levo)  Results for orders placed or performed during the hospital encounter of 07/19/15 (from the past 24 hour(s))  Basic metabolic panel     Status: Abnormal   Collection Time: 07/19/15  6:43 PM  Result Value Ref Range   Sodium 137 135 - 145 mmol/L   Potassium 3.4 (L) 3.5 - 5.1 mmol/L   Chloride 103 101 - 111 mmol/L   CO2 25 22 - 32 mmol/L   Glucose, Bld 111 (H) 65 - 99 mg/dL   BUN 12 6 - 20 mg/dL   Creatinine, Ser 1.06 0.61 - 1.24 mg/dL   Calcium 9.2 8.9 - 10.3 mg/dL   GFR calc non Af Amer >60 >60 mL/min   GFR calc Af Amer >60 >60 mL/min   Anion gap 9 5 - 15  CBC     Status: None   Collection Time: 07/19/15  6:43 PM  Result Value Ref Range   WBC 9.6 4.0 - 10.5 K/uL   RBC 4.89 4.22 - 5.81 MIL/uL   Hemoglobin 15.3 13.0 - 17.0 g/dL   HCT 43.9 39.0 - 52.0 %   MCV 89.8 78.0 - 100.0 fL   MCH 31.3 26.0 - 34.0 pg   MCHC 34.9 30.0 - 36.0 g/dL   RDW 13.0 11.5 - 15.5 %   Platelets 229 150 - 400 K/uL  Urinalysis, Routine w reflex microscopic (not at [facility]  Status: Abnormal   Collection Time: 07/19/15  7:23 PM  Result Value Ref Range   Color, Urine RED (A) YELLOW   APPearance TURBID (A) CLEAR   Specific Gravity, Urine 1.028 1.005 - 1.030   pH 6.0 5.0 - 8.0   Glucose, UA NEGATIVE NEGATIVE mg/dL   Hgb urine dipstick LARGE (A) NEGATIVE   Bilirubin Urine MODERATE (A) NEGATIVE   Ketones, ur NEGATIVE NEGATIVE mg/dL   Protein, ur >300 (A) NEGATIVE mg/dL   Urobilinogen, UA 1.0 0.0 - 1.0 mg/dL   Nitrite NEGATIVE NEGATIVE   Leukocytes, UA  SMALL (A) NEGATIVE  Urine microscopic-add on     Status: Abnormal   Collection Time: 07/19/15  7:23 PM  Result Value Ref Range   Squamous Epithelial / LPF RARE RARE   WBC, UA 3-6 <3 WBC/hpf   RBC / HPF TOO NUMEROUS TO COUNT <3 RBC/hpf   Bacteria, UA FEW (A) RARE   Crystals CA OXALATE CRYSTALS (A) NEGATIVE  Troponin I     Status: None   Collection Time: 07/19/15  9:13 PM  Result Value Ref Range   Troponin I <0.03 <0.031 ng/mL  Brain natriuretic peptide     Status: None   Collection Time: 07/19/15  9:38 PM  Result Value Ref Range   B Natriuretic Peptide 42.1 0.0 - 100.0 pg/mL  I-Stat CG4 Lactic Acid, ED     Status: None   Collection Time: 07/19/15  9:47 PM  Result Value Ref Range   Lactic Acid, Venous 0.81 0.5 - 2.0 mmol/L  Blood culture (routine x 2)     Status: None (Preliminary result)   Collection Time: 07/19/15 11:40 PM  Result Value Ref Range   Specimen Description BLOOD RIGHT HAND    Special Requests BOTTLES DRAWN AEROBIC AND ANAEROBIC 5ML    Culture PENDING    Report Status PENDING   Blood culture (routine x 2)     Status: None (Preliminary result)   Collection Time: 07/19/15 11:46 PM  Result Value Ref Range   Specimen Description BLOOD LEFT FOREARM    Special Requests BOTTLES DRAWN AEROBIC AND ANAEROBIC 5ML    Culture PENDING    Report Status PENDING   I-Stat CG4 Lactic Acid, ED     Status: None   Collection Time: 07/20/15 12:51 AM  Result Value Ref Range   Lactic Acid, Venous 0.83 0.5 - 2.0 mmol/L  Troponin I     Status: None   Collection Time: 07/20/15  2:20 AM  Result Value Ref Range   Troponin I <0.03 <0.031 ng/mL  Troponin I     Status: None   Collection Time: 07/20/15  7:50 AM  Result Value Ref Range   Troponin I <0.03 <0.031 ng/mL  Comprehensive metabolic panel     Status: Abnormal   Collection Time: 07/20/15  7:50 AM  Result Value Ref Range   Sodium 142 135 - 145 mmol/L   Potassium 3.3 (L) 3.5 - 5.1 mmol/L   Chloride 107 101 - 111 mmol/L   CO2 28 22  - 32 mmol/L   Glucose, Bld 94 65 - 99 mg/dL   BUN 9 6 - 20 mg/dL   Creatinine, Ser 0.83 0.61 - 1.24 mg/dL   Calcium 8.8 (L) 8.9 - 10.3 mg/dL   Total Protein 6.3 (L) 6.5 - 8.1 g/dL   Albumin 3.8 3.5 - 5.0 g/dL   AST 16 15 - 41 U/L   ALT 14 (L) 17 - 63 U/L   Alkaline Phosphatase 85  38 - 126 U/L   Total Bilirubin 1.0 0.3 - 1.2 mg/dL   GFR calc non Af Amer >60 >60 mL/min   GFR calc Af Amer >60 >60 mL/min   Anion gap 7 5 - 15  CBC     Status: None   Collection Time: 07/20/15  7:50 AM  Result Value Ref Range   WBC 6.9 4.0 - 10.5 K/uL   RBC 4.71 4.22 - 5.81 MIL/uL   Hemoglobin 14.6 13.0 - 17.0 g/dL   HCT 42.2 39.0 - 52.0 %   MCV 89.6 78.0 - 100.0 fL   MCH 31.0 26.0 - 34.0 pg   MCHC 34.6 30.0 - 36.0 g/dL   RDW 12.8 11.5 - 15.5 %   Platelets 213 150 - 400 K/uL  Protime-INR     Status: Abnormal   Collection Time: 07/20/15  7:50 AM  Result Value Ref Range   Prothrombin Time 15.4 (H) 11.6 - 15.2 seconds   INR 1.21 0.00 - 1.49  Troponin I     Status: None   Collection Time: 07/20/15  2:25 PM  Result Value Ref Range   Troponin I <0.03 <0.031 ng/mL   Recent Results (from the past 240 hour(s))  Blood culture (routine x 2)     Status: None (Preliminary result)   Collection Time: 07/19/15 11:40 PM  Result Value Ref Range Status   Specimen Description BLOOD RIGHT HAND  Final   Special Requests BOTTLES DRAWN AEROBIC AND ANAEROBIC 5ML  Final   Culture PENDING  Incomplete   Report Status PENDING  Incomplete  Blood culture (routine x 2)     Status: None (Preliminary result)   Collection Time: 07/19/15 11:46 PM  Result Value Ref Range Status   Specimen Description BLOOD LEFT FOREARM  Final   Special Requests BOTTLES DRAWN AEROBIC AND ANAEROBIC 5ML  Final   Culture PENDING  Incomplete   Report Status PENDING  Incomplete    Renal Function:  Recent Labs  07/19/15 1843 07/20/15 0750  CREATININE 1.06 0.83   Estimated Creatinine Clearance: 93.6 mL/min (by C-G formula based on Cr of  0.83).  Radiologic Imaging: Dg Chest 2 View  07/19/2015   CLINICAL DATA:  Pt has been on antibiotics since 7/7 for UTI. pt states urgency to pee but cant go. Given repeat antibiotics back to back. Symptoms continue. Pt states chest pain since yesterday with heaviness. Hx of HTN, BPH, CVA with other degenerative basal ganglia diseases, and dementia.  EXAM: CHEST  2 VIEW  COMPARISON:  12/16/2014  FINDINGS: Cardiac silhouette is normal in size and configuration. The aorta is uncoiled and tortuous. No mediastinal or hilar masses or evidence of adenopathy.  Lungs are mildly hyperexpanded but clear. No pleural effusion or pneumothorax.  Bony thorax is intact.  IMPRESSION: No acute cardiopulmonary disease.   Electronically Signed   By: Lajean Manes M.D.   On: 07/19/2015 19:08   Ct Renal Stone Study  07/20/2015   CLINICAL DATA:  Acute onset of lower back pain for 2 days. Hematuria. Initial encounter.  EXAM: CT ABDOMEN AND PELVIS WITHOUT CONTRAST  TECHNIQUE: Multidetector CT imaging of the abdomen and pelvis was performed following the standard protocol without IV contrast.  COMPARISON:  CT of the abdomen and pelvis from 11/01/2013  FINDINGS: The visualized lung bases are clear.  The liver and spleen are unremarkable in appearance. The gallbladder is within normal limits. The pancreas and adrenal glands are unremarkable.  Mild nonspecific perinephric stranding is noted bilaterally. The  kidneys are otherwise unremarkable. There is no evidence of hydronephrosis. No renal or ureteral stones are seen.  No free fluid is identified. The small bowel is unremarkable in appearance. The stomach is within normal limits. No acute vascular abnormalities are seen. Mild scattered calcification is noted along the abdominal aorta and its branches.  The appendix is normal in caliber and contains air, without evidence of appendicitis. The colon is unremarkable in appearance.  The bladder is mildly distended. Mild bladder wall  thickening could reflect mild cystitis, or depending on the patient's symptoms, cystoscopy could be considered to exclude a mass. A large 2.1 cm stone is noted at the right side of the base of the bladder. The prostate is borderline enlarged, measuring 4.8 cm in transverse dimension. No inguinal lymphadenopathy is seen.  No acute osseous abnormalities are identified.  IMPRESSION: 1. Mild bladder wall thickening could reflect mild cystitis, or depending on the patient's symptoms, cystoscopy could be considered to exclude a mass. 2. Large 2.1 cm right bladder stone noted. 3. Borderline enlarged prostate. 4. Mild scattered calcification along the abdominal aorta and its branches.   Electronically Signed   By: Garald Balding M.D.   On: 07/20/2015 00:05    I independently reviewed the above imaging studies.  Impression/Recommendation  69yM with multiple medical issues including Parkinsonism and dementia as well as history of obstructive uropathy s/p TUIP in 2014 with recent culture proven cystitis in the setting of newly identified 2cm bladder calculus which is likely the source of retention and LUTS and may be nidus for infection.  1. Presumed Cystitis-culture from 8/4 growing coag neg staph sensitive to cefazolin, doxycycline, R to cipro. Agree with empiric Ceftriaxone treatment until culture sensitivities result. Follow-up culture results, UA findings and symptoms may be secondary to stone. If positive will need 14 day course of treatment for complicated cystitis.  2. Bladder calculus-2.1 cm noted on non-cont CT scan, likely contributing to both infections and LUTS. Will ultimately need cystolitholapaxy once urine is sterilized. We will discuss timing of this procedure (during this admission versus outpatient scheduling).  3. Lower Urinary Tract Symptoms-likely secondary to the above.Foley catheter in place draining amber urine. Would continue foley until definitive stone treatment as it sounds like it  has been ball-valving and preventing appropriate voiding.  Discussed with Dr. Thereasa Solo 07/20/2015, 4:07 PM    I performed a history and physical examination of the patient and discussed his management with the resident.  I reviewed the resident's note and agree with the documented findings and plan of care.   I think leaving his foley catheter in and letting him go home with it until his bladder stone is removed is most logical and convenient for the patient.  He will need to stay on abx until the procedure.  I will touch base with Dr. Karsten Ro his primary urologist and hopefully plan a procedure within the next 2 weeks to remove his bladder stone.  We will continue to follow.

## 2015-07-20 NOTE — Progress Notes (Addendum)
Progress Note   [name] XBL:[id number] DOB: 1946/09/26 DOA: 07/19/2015 PCP: Cathlean Cower, MD   Brief Narrative:   [name] is an 69 y.o. male with a PMH of dementia, bipolar disorder, CVA, and hypertension who was admitted on 07/19/15 for evaluation of chest pain, weakness and lethargy accompanied by nausea and vomiting. He has had recent urinary tract infections for which she completed a course of Cipro as well as doxycycline. In the ED, he underwent a CT renal stone study which showed findings consistent with cystitis and a large 2.1 cm right bladder stone as well as mild nonspecific bilateral perinephric stranding.  Assessment/Plan:   Principal Problem:   Obstructed, uropathy with cystitis and bilateral perinephric stranding concerning for pyelonephritis in the setting of a large bladder stone - Continue empiric ceftriaxone. - Follow-up blood and urine cultures. - Continue Flomax. Burnis Medin request urology evaluation.  The patient sees Dr. Karsten Ro.  Spoke with Dr. Louis Meckel.  - Likely will need removal of bladder stone. - Will ask RN to bladder scan and place foley if he is retaining, given complaints of frequency.  Active Problems:   NSVT - RN reported a 7 beat run of NSVT.  Check magnesium.    Hypokalemia - We'll give 20 mEq of potassium 2 today.    Chest pain / hyperlipidemia - Troponins are negative.  No current chest pain. - EKG showed sinus rhythm with poor R-wave progression, but no ischemic changes. - We'll cancel echocardiogram for now. - Continue aspirin. Continue to monitor on telemetry. - Resume Lipitor.    Dementia - Remains independent for most of his ADLs. Ambulates with a walker.    HTN (hypertension) - Continue Norvasc.    History of Stroke - Continue aspirin and statin therapy.    GERD (gastroesophageal reflux disease) - Continue PPI therapy.    Bipolar affective disorder - Continue Remeron and as needed Xanax.    BPH - Continue  Flomax.    Parkinson's disease - No longer on Sinemet.    DVT Prophylaxis - Lovenox ordered.  Family Communication: Wife Bebe Shaggy) updated at the bedside. Disposition Plan: Lives with his wife. Home when culture results returned and patient able to tolerate oral antibiotics.  Likely will need removal of bladder stone given recurrent infections. Code Status:     Code Status Orders        Start     Ordered   07/20/15 0157  Full code   Continuous     07/20/15 0158        IV Access:    Peripheral IV   Procedures and diagnostic studies:   Dg Chest 2 View  07/19/2015   CLINICAL DATA:  Pt has been on antibiotics since 7/7 for UTI. pt states urgency to pee but cant go. Given repeat antibiotics back to back. Symptoms continue. Pt states chest pain since yesterday with heaviness. Hx of HTN, BPH, CVA with other degenerative basal ganglia diseases, and dementia.  EXAM: CHEST  2 VIEW  COMPARISON:  12/16/2014  FINDINGS: Cardiac silhouette is normal in size and configuration. The aorta is uncoiled and tortuous. No mediastinal Love hilar masses Love evidence of adenopathy.  Lungs are mildly hyperexpanded but clear. No pleural effusion Love pneumothorax.  Bony thorax is intact.  IMPRESSION: No acute cardiopulmonary disease.   Electronically Signed   By: Lajean Manes M.D.   On: 07/19/2015 19:08   Ct Renal Stone Study  07/20/2015   CLINICAL DATA:  Acute onset of lower back pain for 2 days. Hematuria. Initial encounter.  EXAM: CT ABDOMEN AND PELVIS WITHOUT CONTRAST  TECHNIQUE: Multidetector CT imaging of the abdomen and pelvis was performed following the standard protocol without IV contrast.  COMPARISON:  CT of the abdomen and pelvis from 11/01/2013  FINDINGS: The visualized lung bases are clear.  The liver and spleen are unremarkable in appearance. The gallbladder is within normal limits. The pancreas and adrenal glands are unremarkable.  Mild nonspecific perinephric stranding is noted bilaterally. The  kidneys are otherwise unremarkable. There is no evidence of hydronephrosis. No renal Love ureteral stones are seen.  No free fluid is identified. The small bowel is unremarkable in appearance. The stomach is within normal limits. No acute vascular abnormalities are seen. Mild scattered calcification is noted along the abdominal aorta and its branches.  The appendix is normal in caliber and contains air, without evidence of appendicitis. The colon is unremarkable in appearance.  The bladder is mildly distended. Mild bladder wall thickening could reflect mild cystitis, Love depending on the patient's symptoms, cystoscopy could be considered to exclude a mass. A large 2.1 cm stone is noted at the right side of the base of the bladder. The prostate is borderline enlarged, measuring 4.8 cm in transverse dimension. No inguinal lymphadenopathy is seen.  No acute osseous abnormalities are identified.  IMPRESSION: 1. Mild bladder wall thickening could reflect mild cystitis, Love depending on the patient's symptoms, cystoscopy could be considered to exclude a mass. 2. Large 2.1 cm right bladder stone noted. 3. Borderline enlarged prostate. 4. Mild scattered calcification along the abdominal aorta and its branches.   Electronically Signed   By: Garald Balding M.D.   On: 07/20/2015 00:05     Medical Consultants:    Urology  Anti-Infectives:    Rocephin 07/19/15--->  Subjective:   [name] denies any chest pain at present.  Felt some pressure and the sensation of a sharp, stinging pain yesterday (describes it like being shot by a BB gun).  No complaints of shortness of breath.  Having urinary frequency and dysuria.  Has nausea, but denies vomiting.  Objective:    Filed Vitals:   07/19/15 2103 07/19/15 2207 07/20/15 0045 07/20/15 0345  BP: 147/88 152/78 155/89 156/96  Pulse: 89 92 87 81  Temp: 98.5 F (36.9 C) 99.5 F (37.5 C) 98.7 F (37.1 C) 98.6 F (37 C)  TempSrc: Oral Rectal Rectal Oral  Resp:  17 14 11 12   Height:    6\' 1"  (1.854 m)  Weight:    87.3 kg (192 lb 7.4 oz)  SpO2: 97% 95% 96% 97%    Intake/Output Summary (Last 24 hours) at 07/20/15 0710 Last data filed at 07/20/15 1829  Gross per 24 hour  Intake    353 ml  Output    680 ml  Net   -327 ml    Exam: Gen:  NAD Cardiovascular:  RRR, No M/R/G Respiratory:  Lungs CTAB Gastrointestinal:  Abdomen soft, NT/ND, + BS Extremities:  No C/E/C   Data Reviewed:    Labs: Basic Metabolic Panel:  Recent Labs Lab 07/19/15 1843  NA 137  K 3.4*  CL 103  CO2 25  GLUCOSE 111*  BUN 12  CREATININE 1.06  CALCIUM 9.2   GFR Estimated Creatinine Clearance: 73.3 mL/min (by C-G formula based on Cr of 1.06).  CBC:  Recent Labs Lab 07/19/15 1843  WBC 9.6  HGB 15.3  HCT 43.9  MCV 89.8  PLT 229   Cardiac Enzymes:  Recent Labs Lab 07/19/15 2113 07/20/15 0220  TROPONINI <0.03 <0.03   Sepsis Labs:  Recent Labs Lab 07/19/15 1843 07/19/15 2147 07/20/15 0051  WBC 9.6  --   --   LATICACIDVEN  --  0.81 0.83   Microbiology No results found for this Love any previous visit (from the past 240 hour(s)).   Medications:   . amLODipine  10 mg Oral Daily  . cefTRIAXone (ROCEPHIN)  IV  1 g Intravenous Q24H  . mirtazapine  15 mg Oral QHS  . pantoprazole  40 mg Oral Daily  . phenazopyridine  100 mg Oral TID WC  . sodium chloride  3 mL Intravenous Q12H  . tamsulosin  0.4 mg Oral QPC supper   Continuous Infusions: . sodium chloride 75 mL/hr at 07/20/15 0213    Time spent: 30 minutes.   LOS: 0 days   [name]  [facility] Pager [phone]. If unable to reach me by pager, please call my cell phone at [phone].  *Please refer to [email or web address], password [email or web address] to get updated schedule on who will round on this patient, as hospitalists switch teams weekly. If 7PM-7AM, please contact night-coverage at [email or web address], password [email or web address] for any overnight needs.  07/20/2015, 7:10 AM

## 2015-07-20 NOTE — Care Management Note (Signed)
Case Management Note  Patient Details  Name: VARIAN INNES MRN: [id number] Date of Birth: [birth year]  Subjective/Objective: 69 y/o m admitted w/Obstructed uropathy,cystitis. From home.                   Action/Plan:d/c plan home.   Expected Discharge Date:                  Expected Discharge Plan:  Home/Self Care  In-House Referral:     Discharge planning Services  CM Consult  Post Acute Care Choice:    Choice offered to:     DME Arranged:    DME Agency:     HH Arranged:    HH Agency:     Status of Service:  In process, will continue to follow  Medicare Important Message Given:    Date Medicare IM Given:    Medicare IM give by:    Date Additional Medicare IM Given:    Additional Medicare Important Message give by:     If discussed at [place] of Stay Meetings, dates discussed:    Additional Comments:  Dessa Phi, RN 07/20/2015, 12:32 PM

## 2015-07-21 DIAGNOSIS — N10 Acute tubulo-interstitial nephritis: Principal | ICD-10-CM

## 2015-07-21 LAB — URINE CULTURE: Culture: NO GROWTH

## 2015-07-21 MED ORDER — SORBITOL 70 % SOLN
30.0000 mL | Freq: Once | Status: AC
  Administered 2015-07-21: 30 mL via ORAL
  Filled 2015-07-21: qty 30

## 2015-07-21 MED ORDER — CEPHALEXIN 500 MG PO CAPS
500.0000 mg | ORAL_CAPSULE | Freq: Two times a day (BID) | ORAL | Status: DC
  Administered 2015-07-21 – 2015-07-22 (×2): 500 mg via ORAL
  Filled 2015-07-21 (×2): qty 1

## 2015-07-21 MED ORDER — SORBITOL 70 % SOLN
30.0000 mL | Freq: Every day | Status: DC
  Filled 2015-07-21: qty 30

## 2015-07-21 MED ORDER — POTASSIUM CHLORIDE CRYS ER 20 MEQ PO TBCR
40.0000 meq | EXTENDED_RELEASE_TABLET | Freq: Every day | ORAL | Status: DC
  Administered 2015-07-21 – 2015-07-22 (×2): 40 meq via ORAL
  Filled 2015-07-21 (×2): qty 2

## 2015-07-21 NOTE — Care Management Note (Signed)
Case Management Note  Patient Details  Name: RAWSON MINIX MRN: [id number] Date of Birth: October 20, 1946  Subjective/Objective:   [facility] chosen by spouse for [facility]. [facility] rep Kristen aware of referral & orders. Await d/c order.                 Action/Plan:d/c home w/HHC.    Expected Discharge Date:                  Expected Discharge Plan:  [place]  In-House Referral:     Discharge planning Services  CM Consult  Post Acute Care Choice:    Choice offered to:  Spouse  DME Arranged:    DME Agency:     HH Arranged:  RN, PT, Nurse's Aide [place] Agency:  [place]  Status of Service:  In process, will continue to follow  Medicare Important Message Given:    Date Medicare IM Given:    Medicare IM give by:    Date Additional Medicare IM Given:    Additional Medicare Important Message give by:     If discussed at [place] of Stay Meetings, dates discussed:    Additional Comments:  Dessa Phi, RN 07/21/2015, 3:39 PM

## 2015-07-21 NOTE — Progress Notes (Signed)
[name]:[id number] DOB: February 03, 1946 DOA: 07/19/2015 PCP: Cathlean Cower, MD  Brief narrative: 69 y/o ? Parkinson's disease-started on Sinemet 11/25/2014 as per neurology-never followed up with neurology Bipolar/dementia/depression Chronic pain syndrome probably related to arthritis-started on tramadol 06/10/15 HTN Prior stroke 2011  Admitted 04/19/60 with metabolic encephalopathy, lethargy and weakness despite 2 courses of ciprofloxacin/doxycycline  Felt source of issues were obstructive uropathy with Ct based findings large bladder stone and possible cystitis Had 7 bt run [place]  Past medical history-As per Problem list Chart reviewed as below-   Consultants:  Urology Dr. Louis Meckel  Procedures:  none  Antibiotics:  Ceftriaxone 8/16-8/17  Keflex   Subjective   Doing fair. Wants to go home with IV antibiotics as is worried about infection Otherwise back to baseline according to wife Tolerating diet No chest pain Has not had a stool therefore asking for laxitive   Objective    Interim History:   Telemetry:    Objective: Filed Vitals:   07/20/15 1317 07/20/15 2050 07/21/15 0521 07/21/15 1400  BP: 115/71 144/88 137/85 117/73  Pulse: 72 75 62 71  Temp: 98.7 F (37.1 C) 99 F (37.2 C) 98.1 F (36.7 C) 97.5 F (36.4 C)  TempSrc: Oral Oral Oral Oral  Resp: 12 16 12 16   Height:      Weight:   88.4 kg (194 lb 14.2 oz)   SpO2: 97% 97% 97% 97%    Intake/Output Summary (Last 24 hours) at 07/21/15 1414 Last data filed at 07/21/15 0900  Gross per 24 hour  Intake 2208.75 ml  Output   2625 ml  Net -416.25 ml    Exam:  General: EOMI NCAT pleasant oriented Cardiovascular: S1-S2 no murmur rub or gallop Respiratory: Clinically clear no added sound Abdomen: Abdomen is soft nontender nondistended no rebound Skin no lower extremity edema Neuro grossly intact  Data Reviewed: Basic Metabolic Panel:  Recent Labs Lab 07/19/15 1843 07/20/15 0750  07/20/15 1425  NA 137 142  --   K 3.4* 3.3*  --   CL 103 107  --   CO2 25 28  --   GLUCOSE 111* 94  --   BUN 12 9  --   CREATININE 1.06 0.83  --   CALCIUM 9.2 8.8*  --   MG  --   --  2.0   Liver Function Tests:  Recent Labs Lab 07/20/15 0750  AST 16  ALT 14*  ALKPHOS 85  BILITOT 1.0  PROT 6.3*  ALBUMIN 3.8   No results for input(s): LIPASE, AMYLASE in the last 168 hours. No results for input(s): AMMONIA in the last 168 hours. CBC:  Recent Labs Lab 07/19/15 1843 07/20/15 0750  WBC 9.6 6.9  HGB 15.3 14.6  HCT 43.9 42.2  MCV 89.8 89.6  PLT 229 213   Cardiac Enzymes:  Recent Labs Lab 07/19/15 2113 07/20/15 0220 07/20/15 0750 07/20/15 1425  TROPONINI <0.03 <0.03 <0.03 <0.03   BNP: Invalid input(s): POCBNP CBG: No results for input(s): GLUCAP in the last 168 hours.  Recent Results (from the past 240 hour(s))  Blood culture (routine x 2)     Status: None (Preliminary result)   Collection Time: 07/19/15 11:40 PM  Result Value Ref Range Status   Specimen Description BLOOD RIGHT HAND  Final   Special Requests BOTTLES DRAWN AEROBIC AND ANAEROBIC 5ML  Final   Culture PENDING  Incomplete   Report Status PENDING  Incomplete  Urine culture     Status: None  Collection Time: 07/19/15 11:42 PM  Result Value Ref Range Status   Specimen Description URINE, CLEAN CATCH  Final   Special Requests NONE  Final   Culture   Final    NO GROWTH 1 DAY Performed at [facility]    Report Status 07/21/2015 FINAL  Final  Blood culture (routine x 2)     Status: None (Preliminary result)   Collection Time: 07/19/15 11:46 PM  Result Value Ref Range Status   Specimen Description BLOOD LEFT FOREARM  Final   Special Requests BOTTLES DRAWN AEROBIC AND ANAEROBIC 5ML  Final   Culture PENDING  Incomplete   Report Status PENDING  Incomplete     Studies:              All Imaging reviewed and is as per above notation   Scheduled Meds: . amLODipine  10 mg Oral Daily  .  atorvastatin  10 mg Oral q1800  . cefTRIAXone (ROCEPHIN)  IV  1 g Intravenous Q24H  . mirtazapine  15 mg Oral QHS  . pantoprazole  40 mg Oral Daily  . phenazopyridine  100 mg Oral TID WC  . potassium chloride  40 mEq Oral Daily  . sodium chloride  3 mL Intravenous Q12H  . tamsulosin  0.4 mg Oral QPC supper   Continuous Infusions: . sodium chloride 75 mL/hr at 07/21/15 1122     Assessment/Plan:  Principal Problem:   Acute pyelonephritis-culture negative s/p multiple rounds Abx Transitioned from IV ceftriaxone to Keflex as per discussion with Dr. Louis Meckel 8/17 -If no further fever nor chills in the next 24 hours okay to probably discharge home? Has an appointment with regular urologist on 07/27/15 for consideration of morsellization or stone destruction Active Problems:    Dementia -Mild only -Much more oriented compared to on admission    HTN (hypertension), controlled -Continue amlodipine 10 mg daily    BPH (benign prostatic hyperplasia) -Continue Flomax 0.4 daily at bedtime  -short-term course of Pyridium 100 3 times a day    Hyperlipidemia -Continue atorvastatin 10 daily    Stroke -Curiously not on any antiplatelets agent?. We will investigate    GERD (gastroesophageal reflux disease) -Continue Protonix 40 daily    Bipolar affective disorder -Continue Xanax 0.5 3 times a day, Remeron 15 daily at bedtime    Parkinson's disease -Consider Sinemet as an outpatient-patient has been referred to neurologist Dr. Carles Collet     NSVT (nonsustained ventricular tachycardia) -Stable today   Appt with PCP: Requested Code Status: Full code Family Communication: family +--discussed within detail  Disposition Plan: consider discharge to home dependent on therapy evaluation. Home health PT OT has been ordered  DVT prophylaxis: SCD Consultants:   Verneita Griffes, MD  [facility] Pager [phone] 07/21/2015, 2:14 PM    LOS: 1 day

## 2015-07-21 NOTE — Evaluation (Signed)
Physical Therapy Evaluation Patient Details Name: [name] MRN: [id number] DOB: [birth year] Today's Date: 07/21/2015   History of Present Illness  69 y.o. M with history of demetia, CVA, bipolar disorder, HTN, LUTS and history of bladder stones who presented to the [place] ED 07/20/15 with failure to thrive including nausea and emesis with significantly decreased PO intake and admitted for Obstructed, uropathy with cystitis and bilateral perinephric stranding concerning for pyelonephritis in the setting of a large bladder stone   Clinical Impression  Pt admitted with above diagnosis. Pt currently with functional limitations due to the deficits listed below (see PT Problem List).  Pt will benefit from skilled PT to increase their independence and safety with mobility to allow discharge to the venue listed below.  Pt assisted with ambulating in hallway and reports feeling a little weak and unsteady since he hasn't ambulated since admission.  Pt encouraged to ambulate with staff for safety during acute stay.    Follow Up Recommendations Home health PT;Supervision for mobility/OOB    Equipment Recommendations  None recommended by PT    Recommendations for Other Services       Precautions / Restrictions Precautions Precautions: Fall      Mobility  Bed Mobility Overal bed mobility: Needs Assistance Bed Mobility: Supine to Sit     Supine to sit: Supervision        Transfers Overall transfer level: Needs assistance Equipment used: None Transfers: Sit to/from Stand Sit to Stand: Min assist         General transfer comment: verbal cues for safe technique, difficulty with controlling descent, practiced x2 for technique  Ambulation/Gait Ambulation/Gait assistance: Min guard Ambulation Distance (Feet): 300 Feet Assistive device: None Gait Pattern/deviations: Step-through pattern;Decreased stride length;Shuffle;Narrow base of support     General Gait Details: verbal  cues for big steps, slightly unsteady however no LOB  Stairs            Wheelchair Mobility    Modified Rankin (Stroke Patients Only)       Balance Overall balance assessment: Needs assistance         Standing balance support: No upper extremity supported Standing balance-Leahy Scale: Fair                               Pertinent Vitals/Pain Pain Assessment: No/denies pain    Home Living Family/patient expects to be discharged to:: Private residence Living Arrangements: Spouse/significant other Available Help at Discharge: Family;Available 24 hours/day Type of Home: House Home Access: Stairs to enter Entrance Stairs-Rails: Right;Left;Can reach both Entrance Stairs-Number of Steps: 2 Home Layout: One level        Prior Function Level of Independence: Needs assistance   Gait / Transfers Assistance Needed: ambulates with spouse  ADL's / Homemaking Assistance Needed: Pt toileted independently, assisted for LB dressing, total assist for showering, self fed, assisted to shave        Hand Dominance        Extremity/Trunk Assessment               Lower Extremity Assessment: Generalized weakness         Communication   Communication: Expressive difficulties (dysarthria)  Cognition Arousal/Alertness: Awake/alert Behavior During Therapy: WFL for tasks assessed/performed Overall Cognitive Status: History of cognitive impairments - at baseline                      General Comments  Exercises        Assessment/Plan    PT Assessment Patient needs continued PT services  PT Diagnosis Difficulty walking   PT Problem List Decreased mobility;Decreased strength;Decreased activity tolerance;Decreased balance  PT Treatment Interventions DME instruction;Gait training;Functional mobility training;Patient/family education;Therapeutic activities;Therapeutic exercise;Balance training   PT Goals (Current goals can be found in the  Care Plan section) Acute Rehab PT Goals PT Goal Formulation: With patient Time For Goal Achievement: 07/28/15 Potential to Achieve Goals: Good    Frequency Min 3X/week   Barriers to discharge        Co-evaluation               End of Session Equipment Utilized During Treatment: Gait belt Activity Tolerance: Patient tolerated treatment well Patient left: in chair;with call bell/phone within reach;with family/visitor present           Time: 1537-9432 PT Time Calculation (min) (ACUTE ONLY): 27 min   Charges:   PT Evaluation $Initial PT Evaluation Tier I: 1 Procedure     PT G Codes:        Lional Icenogle,KATHrine E 07/21/2015, 1:24 PM Carmelia Bake, PT, DPT 07/21/2015 Pager: [phone]

## 2015-07-21 NOTE — Progress Notes (Signed)
Cultures from office on 07/08/15 grew S. Aureus with resistance to quinolones and bactrim.  He recently completed 14 days of doxycycline.  Recommend that patient be discharged with catheter on Keflex which he should continue until his surgery (~14 days out).  He has follow-up with Dr. Karsten Ro on August 23 at 12:30 pm.

## 2015-07-22 LAB — CBC WITH DIFFERENTIAL/PLATELET
BASOS ABS: 0 10*3/uL (ref 0.0–0.1)
Basophils Relative: 0 % (ref 0–1)
Eosinophils Absolute: 0.2 10*3/uL (ref 0.0–0.7)
Eosinophils Relative: 3 % (ref 0–5)
HEMATOCRIT: 39.9 % (ref 39.0–52.0)
Hemoglobin: 13.5 g/dL (ref 13.0–17.0)
LYMPHS PCT: 30 % (ref 12–46)
Lymphs Abs: 2.1 10*3/uL (ref 0.7–4.0)
MCH: 30.7 pg (ref 26.0–34.0)
MCHC: 33.8 g/dL (ref 30.0–36.0)
MCV: 90.7 fL (ref 78.0–100.0)
Monocytes Absolute: 0.6 10*3/uL (ref 0.1–1.0)
Monocytes Relative: 9 % (ref 3–12)
NEUTROS ABS: 4.2 10*3/uL (ref 1.7–7.7)
Neutrophils Relative %: 58 % (ref 43–77)
PLATELETS: 170 10*3/uL (ref 150–400)
RBC: 4.4 MIL/uL (ref 4.22–5.81)
RDW: 12.9 % (ref 11.5–15.5)
WBC: 7.1 10*3/uL (ref 4.0–10.5)

## 2015-07-22 LAB — BASIC METABOLIC PANEL
ANION GAP: 6 (ref 5–15)
BUN: 10 mg/dL (ref 6–20)
CO2: 27 mmol/L (ref 22–32)
Calcium: 8.4 mg/dL — ABNORMAL LOW (ref 8.9–10.3)
Chloride: 108 mmol/L (ref 101–111)
Creatinine, Ser: 0.86 mg/dL (ref 0.61–1.24)
GFR calc Af Amer: >60 mL/min (ref >60)
GLUCOSE: 95 mg/dL (ref 65–99)
POTASSIUM: 3.5 mmol/L (ref 3.5–5.1)
Sodium: 141 mmol/L (ref 135–145)

## 2015-07-22 MED ORDER — SORBITOL 70 % SOLN: 30.0000 mL | Freq: Every day | Status: DC

## 2015-07-22 MED ORDER — POTASSIUM CHLORIDE CRYS ER 20 MEQ PO TBCR: 40.0000 meq | EXTENDED_RELEASE_TABLET | Freq: Every day | ORAL | Status: DC

## 2015-07-22 MED ORDER — CEPHALEXIN 500 MG PO CAPS: 500.0000 mg | ORAL_CAPSULE | Freq: Two times a day (BID) | ORAL | Status: DC

## 2015-07-22 MED ORDER — ONDANSETRON HCL 4 MG PO TABS: 4.0000 mg | ORAL_TABLET | Freq: Four times a day (QID) | ORAL | Status: DC | PRN

## 2015-07-22 NOTE — Discharge Summary (Signed)
Physician Discharge Summary  [name] LSL:[id number] DOB: [birth year] DOA: 07/19/2015  PCP: Cathlean Cower, MD  Admit date: 07/19/2015 Discharge date: 07/22/2015  Time spent: 35 minutes  Recommendations for Outpatient Follow-up:  1. Needs to complete keflex 500 bid 07/28/15 2. Dr. Karsten Ro will see patient in follow up for consideration of surgical options for bladder stone 3. Needs indwelling foley + leg bag for now 4. HH has been ordered to help at home 5. consider Cbc 1 week 6. Continue Anti-plt med such as Aspirin after bladder surgery 7. Needs to follow up Dr. Carles Collet Neurology for ? Parkinson's    Discharge Diagnoses:  Principal Problem:   Acute pyelonephritis-culture negative s/p multiple rounds Abx Active Problems:   Dementia   HTN (hypertension)   BPH (benign prostatic hyperplasia)   Hyperlipidemia   Stroke   GERD (gastroesophageal reflux disease)   Bipolar affective disorder   Chest pain   Urinary bladder stone   Hypokalemia   Parkinson's disease   Cystitis   Obstructed, uropathy   History of CVA (cerebrovascular accident)   Pain in the chest   NSVT (nonsustained ventricular tachycardia)   Discharge Condition: fair  Diet recommendation: HH low salt  Filed Weights   07/20/15 0345 07/21/15 0521 07/22/15 0446  Weight: 87.3 kg (192 lb 7.4 oz) 88.4 kg (194 lb 14.2 oz) 89.5 kg (197 lb 5 oz)    History of present illness:   69 y/o ? Parkinson's disease-started on Sinemet 11/25/2014 as per neurology-never followed up with neurology Bipolar/dementia/depression Chronic pain syndrome probably related to arthritis-started on tramadol 06/10/15 HTN Prior stroke 2011  Admitted [birth year]/18/70 with metabolic encephalopathy, lethargy and weakness despite 2 courses of ciprofloxacin/doxycycline  Felt source of issues were obstructive uropathy with Ct based findings large bladder stone and possible cystitis Had 7 bt run [facility] Course:    Acute pyelonephritis-culture  negative s/p multiple rounds Abx Transitioned from IV ceftriaxone to Keflex as per discussion with Dr. Louis Meckel 8/17 -Dr. Louis Meckel discussed his case with me on 8/18 and felt that patient was stable for d/c with close follow up with Dr. Karsten Ro   Dementia -Mild only -Much more oriented compared to on admission   HTN (hypertension), controlled -Continue amlodipine 10 mg daily   BPH (benign prostatic hyperplasia) -Continue Flomax 0.4 daily at bedtime  -short-term course of Pyridium 100 3 times a day was stopped after day of d/c   Hyperlipidemia -Continue atorvastatin 10 daily   Stroke -Curiously not on any antiplatelets agent?. We will need to consider use of this as OP after surgery   GERD (gastroesophageal reflux disease) -Continue Protonix 40 daily   Bipolar affective disorder -Continue Xanax 0.5 3 times a day, Remeron 15 daily at bedtime   Parkinson's disease -Consider Sinemet as an outpatient-patient has been referred to neurologist Dr. Carles Collet   NSVT (nonsustained ventricular tachycardia) -Stable today  Discharge Exam: Filed Vitals:   07/22/15 1041  BP:   Pulse:   Temp: 98.1 F (36.7 C)  Resp:     General: eomi ncat Cardiovascular: s1 s 2no m/r/g Respiratory: clear no added sound  Discharge Instructions   Discharge Instructions    Diet - low sodium heart healthy    Complete by:  As directed      Discharge instructions    Complete by:  As directed   Complete course of Keflex Keep Apt with Dr. Karsten Ro Aug 23 You will need a foley catheter and a leg bag until you see Dr. Karsten Ro  We will get therapy to come to your home     Increase activity slowly    Complete by:  As directed           Current Discharge Medication List    START taking these medications   Details  cephALEXin (KEFLEX) 500 MG capsule Take 1 capsule (500 mg total) by mouth every 12 (twelve) hours. Qty: 20 capsule, Refills: 0    !! ondansetron (ZOFRAN) 4 MG tablet Take 1 tablet  (4 mg total) by mouth every 6 (six) hours as needed for nausea. Qty: 20 tablet, Refills: 0    potassium chloride SA (K-DUR,KLOR-CON) 20 MEQ tablet Take 2 tablets (40 mEq total) by mouth daily. Qty: 20 tablet, Refills: 0    sorbitol 70 % SOLN Take 30 mLs by mouth daily. Qty: 473 mL, Refills: 0     !! - Potential duplicate medications found. Please discuss with provider.    CONTINUE these medications which have NOT CHANGED   Details  ALPRAZolam (XANAX) 0.5 MG tablet TAKE 1 TABLET BY MOUTH THREE TIMES DAILY AS NEEDED FOR ANXIETY Qty: 90 tablet, Refills: 2    amLODipine (NORVASC) 10 MG tablet Take 1 tablet (10 mg total) by mouth daily. Qty: 30 tablet, Refills: 3    Flaxseed, Linseed, (FLAXSEED OIL PO) Take 2 capsules by mouth daily.    mirtazapine (REMERON) 15 MG tablet Take 1 tablet (15 mg total) by mouth at bedtime. Qty: 90 tablet, Refills: 3    omega-3 acid ethyl esters (LOVAZA) 1 G capsule Take 3 g by mouth daily.    !! ondansetron (ZOFRAN) 4 MG tablet TAKE 1 TABLET BY MOUTH EVERY 8 HOURS AS NEEDED FOR NAUSEA OR VOMITING Qty: 90 tablet, Refills: 0    pantoprazole (PROTONIX) 40 MG tablet Take 1 tablet (40 mg total) by mouth daily. Qty: 90 tablet, Refills: 3    atorvastatin (LIPITOR) 10 MG tablet     carbidopa-levodopa (SINEMET IR) 25-100 MG per tablet Take 1 tablet by mouth 3 (three) times daily. Qty: 90 tablet, Refills: 5    fesoterodine (TOVIAZ) 8 MG TB24 tablet Take 8 mg by mouth daily.    Omega-3 Fatty Acids (FISH OIL) 1200 MG CAPS Take 2,400 mg by mouth daily.    polyethylene glycol powder (MIRALAX) powder Take 17 g by mouth daily. Qty: 527 g, Refills: 0    tamsulosin (FLOMAX) 0.4 MG CAPS capsule Take 1 capsule (0.4 mg total) by mouth daily after supper. Qty: 90 capsule, Refills: 3    traMADol (ULTRAM) 50 MG tablet Take 1 tablet (50 mg total) by mouth every 8 (eight) hours as needed. Qty: 90 tablet, Refills: 5    vitamin B-12 1000 MCG tablet Take 1 tablet (1,000  mcg total) by mouth daily. Qty: 30 tablet, Refills: 0     !! - Potential duplicate medications found. Please discuss with provider.    STOP taking these medications     promethazine (PHENERGAN) 12.5 MG tablet      bisacodyl (DULCOLAX) 5 MG EC tablet      ciprofloxacin (CIPRO) 250 MG tablet        No Known Allergies Follow-up Information    Follow up with [facility].   Why:  HHRN/HHPT/HH Nurse's aide   Contact information:   [address] [place] [address] [phone]        The results of significant diagnostics from this hospitalization (including imaging, microbiology, ancillary and laboratory) are listed below for reference.  Significant Diagnostic Studies: Dg Chest 2 View  07/19/2015   CLINICAL DATA:  Pt has been on antibiotics since 7/7 for UTI. pt states urgency to pee but cant go. Given repeat antibiotics back to back. Symptoms continue. Pt states chest pain since yesterday with heaviness. Hx of HTN, BPH, CVA with other degenerative basal ganglia diseases, and dementia.  EXAM: CHEST  2 VIEW  COMPARISON:  12/16/2014  FINDINGS: Cardiac silhouette is normal in size and configuration. The aorta is uncoiled and tortuous. No mediastinal or hilar masses or evidence of adenopathy.  Lungs are mildly hyperexpanded but clear. No pleural effusion or pneumothorax.  Bony thorax is intact.  IMPRESSION: No acute cardiopulmonary disease.   Electronically Signed   By: Lajean Manes M.D.   On: 07/19/2015 19:08   Ct Renal Stone Study  07/20/2015   CLINICAL DATA:  Acute onset of lower back pain for 2 days. Hematuria. Initial encounter.  EXAM: CT ABDOMEN AND PELVIS WITHOUT CONTRAST  TECHNIQUE: Multidetector CT imaging of the abdomen and pelvis was performed following the standard protocol without IV contrast.  COMPARISON:  CT of the abdomen and pelvis from 11/01/2013  FINDINGS: The visualized lung bases are clear.  The liver and spleen are unremarkable in  appearance. The gallbladder is within normal limits. The pancreas and adrenal glands are unremarkable.  Mild nonspecific perinephric stranding is noted bilaterally. The kidneys are otherwise unremarkable. There is no evidence of hydronephrosis. No renal or ureteral stones are seen.  No free fluid is identified. The small bowel is unremarkable in appearance. The stomach is within normal limits. No acute vascular abnormalities are seen. Mild scattered calcification is noted along the abdominal aorta and its branches.  The appendix is normal in caliber and contains air, without evidence of appendicitis. The colon is unremarkable in appearance.  The bladder is mildly distended. Mild bladder wall thickening could reflect mild cystitis, or depending on the patient's symptoms, cystoscopy could be considered to exclude a mass. A large 2.1 cm stone is noted at the right side of the base of the bladder. The prostate is borderline enlarged, measuring 4.8 cm in transverse dimension. No inguinal lymphadenopathy is seen.  No acute osseous abnormalities are identified.  IMPRESSION: 1. Mild bladder wall thickening could reflect mild cystitis, or depending on the patient's symptoms, cystoscopy could be considered to exclude a mass. 2. Large 2.1 cm right bladder stone noted. 3. Borderline enlarged prostate. 4. Mild scattered calcification along the abdominal aorta and its branches.   Electronically Signed   By: Garald Balding M.D.   On: 07/20/2015 00:05    Microbiology: Recent Results (from the past 240 hour(s))  Blood culture (routine x 2)     Status: None (Preliminary result)   Collection Time: 07/19/15 11:40 PM  Result Value Ref Range Status   Specimen Description BLOOD RIGHT HAND  Final   Special Requests BOTTLES DRAWN AEROBIC AND ANAEROBIC 5ML  Final   Culture   Final    NO GROWTH 1 DAY Performed at [facility]    Report Status PENDING  Incomplete  Urine culture     Status: None   Collection Time:  07/19/15 11:42 PM  Result Value Ref Range Status   Specimen Description URINE, CLEAN CATCH  Final   Special Requests NONE  Final   Culture   Final    NO GROWTH 1 DAY Performed at [facility]    Report Status 07/21/2015 FINAL  Final  Blood culture (routine x 2)  Status: None (Preliminary result)   Collection Time: 07/19/15 11:46 PM  Result Value Ref Range Status   Specimen Description BLOOD LEFT FOREARM  Final   Special Requests BOTTLES DRAWN AEROBIC AND ANAEROBIC 5ML  Final   Culture   Final    NO GROWTH 1 DAY Performed at [facility]    Report Status PENDING  Incomplete     Labs: Basic Metabolic Panel:  Recent Labs Lab 07/19/15 1843 07/20/15 0750 07/20/15 1425 07/22/15 0511  NA 137 142  --  141  K 3.4* 3.3*  --  3.5  CL 103 107  --  108  CO2 25 28  --  27  GLUCOSE 111* 94  --  95  BUN 12 9  --  10  CREATININE 1.06 0.83  --  0.86  CALCIUM 9.2 8.8*  --  8.4*  MG  --   --  2.0  --    Liver Function Tests:  Recent Labs Lab 07/20/15 0750  AST 16  ALT 14*  ALKPHOS 85  BILITOT 1.0  PROT 6.3*  ALBUMIN 3.8   No results for input(s): LIPASE, AMYLASE in the last 168 hours. No results for input(s): AMMONIA in the last 168 hours. CBC:  Recent Labs Lab 07/19/15 1843 07/20/15 0750 07/22/15 0511  WBC 9.6 6.9 7.1  NEUTROABS  --   --  4.2  HGB 15.3 14.6 13.5  HCT 43.9 42.2 39.9  MCV 89.8 89.6 90.7  PLT 229 213 170   Cardiac Enzymes:  Recent Labs Lab 07/19/15 2113 07/20/15 0220 07/20/15 0750 07/20/15 1425  TROPONINI <0.03 <0.03 <0.03 <0.03   BNP: BNP (last 3 results)  Recent Labs  07/19/15 2138  BNP 42.1    ProBNP (last 3 results) No results for input(s): PROBNP in the last 8760 hours.  CBG: No results for input(s): GLUCAP in the last 168 hours.     SignedNita Sells  [facility] 07/22/2015, 1:26 PM

## 2015-07-22 NOTE — Care Management Important Message (Signed)
Important Message  Patient Details  Name: [name] MRN: [id number] Date of Birth: 1945-12-25   Medicare Important Message Given:  [facility] notification given    Camillo Flaming 07/22/2015, 1:05 [place] Message  Patient Details  Name: [name] MRN: [id number] Date of Birth: 1946/11/13   Medicare Important Message Given:  Yes-second notification given    Camillo Flaming 07/22/2015, 1:05 PM

## 2015-07-23 ENCOUNTER — Telehealth: Payer: Self-pay | Admitting: *Deleted

## 2015-07-23 NOTE — Telephone Encounter (Signed)
Pt was tcm list d/c 07/23/15 for bladder problems ? surgery has noted will follow-up with Dr. Loel Lofty...[name]

## 2015-07-25 LAB — CULTURE, BLOOD (ROUTINE X 2)
CULTURE: NO GROWTH
CULTURE: NO GROWTH

## 2015-07-28 NOTE — Telephone Encounter (Signed)
Ok for verbal 

## 2015-07-28 NOTE — Telephone Encounter (Signed)
Donita from [facility] a request for speech therapy Phone [phone]

## 2015-07-28 NOTE — Telephone Encounter (Signed)
Verbal authorization given. 

## 2015-07-28 NOTE — Telephone Encounter (Signed)
Verbal ok?

## 2015-07-29 ENCOUNTER — Other Ambulatory Visit: Payer: Self-pay | Admitting: Urology

## 2015-07-30 ENCOUNTER — Telehealth: Payer: Self-pay | Admitting: Internal Medicine

## 2015-07-30 NOTE — Telephone Encounter (Signed)
[name] advised via VM

## 2015-07-30 NOTE — Telephone Encounter (Signed)
Noted, no change in tx at this time, thanks

## 2015-07-30 NOTE — Telephone Encounter (Signed)
Donita from [facility] called to let you know pts wife is very concerned about his increasing confusion. Pt didn't recognize his wife and stated he wanted to go home. He was home.  He has been fine since Donita has been there. He recognized her when she came in. He is also having some pain in the mid to left side of chest. Pt describes it as a rumbling feeling. Wife gave him 3, 81mg  aspirin.  Temp 99.4  Pulse 71 irregular BP 138/90 Respiration 18 Pulse ox 95 He also is not eating well Donita can be reached at [phone]  He is having surgery soon and is still on antibiotics

## 2015-08-06 ENCOUNTER — Encounter (HOSPITAL_BASED_OUTPATIENT_CLINIC_OR_DEPARTMENT_OTHER): Payer: Self-pay | Admitting: *Deleted

## 2015-08-06 NOTE — Progress Notes (Signed)
SPOKE W/ WIFE,PT HAS DEMENTIA. (SHE'LL NEED TO BE IN PRE-OP)  NPO AFTER MN.  ARRIVE AT 0600. CURRENT LAB RESULTS AND EKG IN CHART AND EPIC.  WILL TAKE PROTONIX, NORVASC, AND SINEMET AM DOS W/ SIPS OF WATER. WIFE AWARE OWER AT MAIN.

## 2015-08-10 ENCOUNTER — Telehealth: Payer: Self-pay | Admitting: *Deleted

## 2015-08-10 NOTE — Telephone Encounter (Signed)
Yes

## 2015-08-10 NOTE — Telephone Encounter (Signed)
-----   Message from Hulan Saas, RN sent at 08/03/2015  3:49 PM EDT ----- No new GI ----- Message -----    From: Hulan Saas, RN    Sent: 08/05/2015      To: Hulan Saas, RN  Call and remind patient due for B12 level for DB on 08/09/15. Lab in EPIC.

## 2015-08-10 NOTE — Telephone Encounter (Signed)
Patient is due for annual B12 level lab for B 12 def. [facility] patient. Is this ok to do? DOD- Deatra Ina.

## 2015-08-11 ENCOUNTER — Other Ambulatory Visit: Payer: Self-pay | Admitting: *Deleted

## 2015-08-11 ENCOUNTER — Other Ambulatory Visit: Payer: Self-pay | Admitting: Urology

## 2015-08-11 DIAGNOSIS — E538 Deficiency of other specified B group vitamins: Secondary | ICD-10-CM

## 2015-08-11 NOTE — Telephone Encounter (Signed)
Spoke with patient's wife and she states the patient is having bladder stone surgery this week. He will come for labs after that. She states he would like to establish with Dr. Havery Moros later on and will call to schedule OV at a later date.

## 2015-08-13 ENCOUNTER — Encounter (HOSPITAL_BASED_OUTPATIENT_CLINIC_OR_DEPARTMENT_OTHER): Payer: Self-pay | Admitting: *Deleted

## 2015-08-13 ENCOUNTER — Ambulatory Visit (HOSPITAL_BASED_OUTPATIENT_CLINIC_OR_DEPARTMENT_OTHER): Payer: Medicare Other | Admitting: Anesthesiology

## 2015-08-13 ENCOUNTER — Encounter (HOSPITAL_COMMUNITY)
Admission: RE | Disposition: A | Discharge status: Home / Self Care | Payer: Self-pay | Source: Ambulatory Visit | Attending: Urology

## 2015-08-13 ENCOUNTER — Ambulatory Visit (HOSPITAL_BASED_OUTPATIENT_CLINIC_OR_DEPARTMENT_OTHER)
Admission: RE | Admit: 2015-08-13 | Discharge: 2015-08-14 | Disposition: A | Discharge status: Home / Self Care | Payer: Medicare Other | Source: Ambulatory Visit | Attending: Urology | Admitting: Urology

## 2015-08-13 DIAGNOSIS — Z8673 Personal history of transient ischemic attack (TIA), and cerebral infarction without residual deficits: Secondary | ICD-10-CM | POA: Insufficient documentation

## 2015-08-13 DIAGNOSIS — G20 Parkinson's disease: Secondary | ICD-10-CM | POA: Diagnosis not present

## 2015-08-13 DIAGNOSIS — R338 Other retention of urine: Secondary | ICD-10-CM | POA: Diagnosis not present

## 2015-08-13 DIAGNOSIS — N138 Other obstructive and reflux uropathy: Secondary | ICD-10-CM | POA: Insufficient documentation

## 2015-08-13 DIAGNOSIS — Z87442 Personal history of urinary calculi: Secondary | ICD-10-CM | POA: Insufficient documentation

## 2015-08-13 DIAGNOSIS — F419 Anxiety disorder, unspecified: Secondary | ICD-10-CM | POA: Insufficient documentation

## 2015-08-13 DIAGNOSIS — F0390 Unspecified dementia without behavioral disturbance: Secondary | ICD-10-CM | POA: Insufficient documentation

## 2015-08-13 DIAGNOSIS — E785 Hyperlipidemia, unspecified: Secondary | ICD-10-CM | POA: Insufficient documentation

## 2015-08-13 DIAGNOSIS — F17210 Nicotine dependence, cigarettes, uncomplicated: Secondary | ICD-10-CM | POA: Insufficient documentation

## 2015-08-13 DIAGNOSIS — R627 Adult failure to thrive: Secondary | ICD-10-CM | POA: Insufficient documentation

## 2015-08-13 DIAGNOSIS — N3090 Cystitis, unspecified without hematuria: Secondary | ICD-10-CM | POA: Insufficient documentation

## 2015-08-13 DIAGNOSIS — Z79899 Other long term (current) drug therapy: Secondary | ICD-10-CM | POA: Insufficient documentation

## 2015-08-13 DIAGNOSIS — N401 Enlarged prostate with lower urinary tract symptoms: Secondary | ICD-10-CM | POA: Insufficient documentation

## 2015-08-13 DIAGNOSIS — F209 Schizophrenia, unspecified: Secondary | ICD-10-CM | POA: Insufficient documentation

## 2015-08-13 DIAGNOSIS — I10 Essential (primary) hypertension: Secondary | ICD-10-CM | POA: Diagnosis not present

## 2015-08-13 DIAGNOSIS — E538 Deficiency of other specified B group vitamins: Secondary | ICD-10-CM | POA: Diagnosis not present

## 2015-08-13 DIAGNOSIS — N210 Calculus in bladder: Secondary | ICD-10-CM | POA: Insufficient documentation

## 2015-08-13 DIAGNOSIS — F319 Bipolar disorder, unspecified: Secondary | ICD-10-CM | POA: Diagnosis not present

## 2015-08-13 HISTORY — DX: Personal history of urinary (tract) infections: Z87.440

## 2015-08-13 HISTORY — PX: CYSTOSCOPY WITH LITHOLAPAXY: SHX1425

## 2015-08-13 HISTORY — DX: Elevated prostate specific antigen (PSA): R97.20

## 2015-08-13 HISTORY — PX: HOLMIUM LASER APPLICATION: SHX5852

## 2015-08-13 HISTORY — DX: Unspecified symptoms and signs involving the genitourinary system: R39.9

## 2015-08-13 HISTORY — DX: Other constipation: K59.09

## 2015-08-13 HISTORY — PX: TRANSURETHRAL RESECTION OF PROSTATE: SHX73

## 2015-08-13 HISTORY — DX: Personal history of other diseases of urinary system: Z87.448

## 2015-08-13 SURGERY — CYSTOSCOPY, WITH BLADDER CALCULUS LITHOLAPAXY
Anesthesia: General | Site: Prostate

## 2015-08-13 MED ORDER — ONDANSETRON HCL 4 MG/2ML IJ SOLN
INTRAMUSCULAR | Status: AC
  Filled 2015-08-13: qty 2

## 2015-08-13 MED ORDER — ACETAMINOPHEN 10 MG/ML IV SOLN
INTRAVENOUS | Status: DC | PRN
Start: 2015-08-13 — End: 2015-08-13
  Administered 2015-08-13: 1000 mg via INTRAVENOUS

## 2015-08-13 MED ORDER — ONDANSETRON HCL 4 MG/2ML IJ SOLN
4.0000 mg | Freq: Once | INTRAMUSCULAR | Status: AC
  Administered 2015-08-13: 4 mg via INTRAVENOUS
  Filled 2015-08-13: qty 2

## 2015-08-13 MED ORDER — PROPOFOL 10 MG/ML IV BOLUS
INTRAVENOUS | Status: DC | PRN
  Administered 2015-08-13: 150 mg via INTRAVENOUS

## 2015-08-13 MED ORDER — FENTANYL CITRATE (PF) 100 MCG/2ML IJ SOLN
INTRAMUSCULAR | Status: AC
  Filled 2015-08-13: qty 4

## 2015-08-13 MED ORDER — PROMETHAZINE HCL 25 MG/ML IJ SOLN
6.2500 mg | INTRAMUSCULAR | Status: AC | PRN
  Administered 2015-08-13 (×2): 6.25 mg via INTRAVENOUS
  Filled 2015-08-13: qty 1

## 2015-08-13 MED ORDER — ONDANSETRON HCL 4 MG/2ML IJ SOLN
4.0000 mg | INTRAMUSCULAR | Status: DC | PRN
  Administered 2015-08-13 – 2015-08-14 (×4): 4 mg via INTRAVENOUS
  Filled 2015-08-13 (×4): qty 2

## 2015-08-13 MED ORDER — CIPROFLOXACIN IN D5W 400 MG/200ML IV SOLN
400.0000 mg | Freq: Two times a day (BID) | INTRAVENOUS | Status: DC
  Administered 2015-08-13: 400 mg via INTRAVENOUS
  Filled 2015-08-13: qty 200

## 2015-08-13 MED ORDER — SODIUM CHLORIDE 0.9 % IR SOLN
Status: DC | PRN
  Administered 2015-08-13: 12000 mL

## 2015-08-13 MED ORDER — HYDROCODONE-ACETAMINOPHEN 5-325 MG PO TABS
1.0000 | ORAL_TABLET | ORAL | Status: DC | PRN
  Administered 2015-08-13: 1 via ORAL
  Administered 2015-08-13: 2 via ORAL
  Administered 2015-08-13: 1 via ORAL
  Administered 2015-08-14: 2 via ORAL
  Filled 2015-08-13 (×2): qty 1
  Filled 2015-08-13 (×2): qty 2

## 2015-08-13 MED ORDER — CIPROFLOXACIN IN D5W 200 MG/100ML IV SOLN
200.0000 mg | Freq: Two times a day (BID) | INTRAVENOUS | Status: AC
  Administered 2015-08-13: 200 mg via INTRAVENOUS
  Filled 2015-08-13: qty 100

## 2015-08-13 MED ORDER — FENTANYL CITRATE (PF) 100 MCG/2ML IJ SOLN
INTRAMUSCULAR | Status: AC
  Filled 2015-08-13: qty 2

## 2015-08-13 MED ORDER — FENTANYL CITRATE (PF) 100 MCG/2ML IJ SOLN
INTRAMUSCULAR | Status: DC | PRN
  Administered 2015-08-13: 50 ug via INTRAVENOUS

## 2015-08-13 MED ORDER — BACITRACIN-NEOMYCIN-POLYMYXIN 400-5-5000 EX OINT: 1.0000 "application " | TOPICAL_OINTMENT | Freq: Three times a day (TID) | CUTANEOUS | Status: DC | PRN

## 2015-08-13 MED ORDER — HYDROCODONE-ACETAMINOPHEN 10-325 MG PO TABS: 1.0000 | ORAL_TABLET | ORAL | Status: DC | PRN

## 2015-08-13 MED ORDER — EPHEDRINE SULFATE 50 MG/ML IJ SOLN
INTRAMUSCULAR | Status: DC | PRN
  Administered 2015-08-13 (×2): 10 mg via INTRAVENOUS

## 2015-08-13 MED ORDER — CIPROFLOXACIN IN D5W 400 MG/200ML IV SOLN
INTRAVENOUS | Status: AC
  Filled 2015-08-13: qty 200

## 2015-08-13 MED ORDER — ACETAMINOPHEN 325 MG PO TABS: 650.0000 mg | ORAL_TABLET | ORAL | Status: DC | PRN

## 2015-08-13 MED ORDER — LACTATED RINGERS IV SOLN
INTRAVENOUS | Status: DC
  Administered 2015-08-13 (×2): via INTRAVENOUS
  Filled 2015-08-13: qty 1000

## 2015-08-13 MED ORDER — LIDOCAINE HCL (CARDIAC) 20 MG/ML IV SOLN
INTRAVENOUS | Status: DC | PRN
  Administered 2015-08-13: 50 mg via INTRAVENOUS

## 2015-08-13 MED ORDER — MEPERIDINE HCL 25 MG/ML IJ SOLN
6.2500 mg | INTRAMUSCULAR | Status: DC | PRN
  Filled 2015-08-13: qty 1

## 2015-08-13 MED ORDER — ONDANSETRON HCL 4 MG/2ML IJ SOLN
INTRAMUSCULAR | Status: DC | PRN
  Administered 2015-08-13: 4 mg via INTRAVENOUS

## 2015-08-13 MED ORDER — ALPRAZOLAM 0.5 MG PO TABS
0.5000 mg | ORAL_TABLET | Freq: Three times a day (TID) | ORAL | Status: DC | PRN
  Administered 2015-08-13: 0.5 mg via ORAL
  Filled 2015-08-13: qty 1

## 2015-08-13 MED ORDER — PROMETHAZINE HCL 25 MG/ML IJ SOLN
INTRAMUSCULAR | Status: AC
Start: 2015-08-13 — End: 2015-08-13
  Filled 2015-08-13: qty 1

## 2015-08-13 MED ORDER — FENTANYL CITRATE (PF) 100 MCG/2ML IJ SOLN
25.0000 ug | INTRAMUSCULAR | Status: DC | PRN
  Administered 2015-08-13 (×3): 25 ug via INTRAVENOUS
  Filled 2015-08-13: qty 1

## 2015-08-13 MED ORDER — DEXAMETHASONE SODIUM PHOSPHATE 4 MG/ML IJ SOLN
INTRAMUSCULAR | Status: DC | PRN
  Administered 2015-08-13: 10 mg via INTRAVENOUS

## 2015-08-13 MED ORDER — BELLADONNA ALKALOIDS-OPIUM 16.2-60 MG RE SUPP
1.0000 | Freq: Four times a day (QID) | RECTAL | Status: DC | PRN
Start: 2015-08-13 — End: 2015-08-14
  Administered 2015-08-13: 1 via RECTAL
  Filled 2015-08-13: qty 1

## 2015-08-13 MED ORDER — SODIUM CHLORIDE 0.9 % IV SOLN
INTRAVENOUS | Status: DC
  Administered 2015-08-13 – 2015-08-14 (×2): via INTRAVENOUS

## 2015-08-13 SURGICAL SUPPLY — 61 items
ADAPTER CATH URET PLST 4-6FR (CATHETERS) IMPLANT
ADPR CATH URET STRL DISP 4-6FR (CATHETERS)
BAG DRAIN URO-CYSTO SKYTR STRL (DRAIN) ×3 IMPLANT
BAG DRN ANRFLXCHMBR STRAP LEK (BAG)
BAG DRN UROCATH (DRAIN) ×2
BAG URINE DRAINAGE (UROLOGICAL SUPPLIES) ×1 IMPLANT
BAG URINE LEG 19OZ MD ST LTX (BAG) IMPLANT
BASKET LASER NITINOL 1.9FR (BASKET) IMPLANT
BASKET STNLS GEMINI 4WIRE 3FR (BASKET) IMPLANT
BASKET ZERO TIP NITINOL 2.4FR (BASKET) IMPLANT
BLADE SURG 15 STRL LF DISP TIS (BLADE) IMPLANT
BLADE SURG 15 STRL SS (BLADE) ×3
BSKT STON RTRVL 120 1.9FR (BASKET)
BSKT STON RTRVL GEM 120X11 3FR (BASKET)
BSKT STON RTRVL ZERO TP 2.4FR (BASKET)
CANISTER SUCT LVC 12 LTR MEDI- (MISCELLANEOUS) IMPLANT
CATH FOLEY 3WAY 20FR (CATHETERS) ×1 IMPLANT
CATH FOLEY 3WAY 30CC 24FR (CATHETERS) ×3
CATH HEMA 3WAY 30CC 24FR COUDE (CATHETERS) IMPLANT
CATH HEMA 3WAY 30CC 24FR RND (CATHETERS) IMPLANT
CATH INTERMIT  6FR 70CM (CATHETERS) IMPLANT
CATH URET 5FR 28IN CONE TIP (BALLOONS)
CATH URET 5FR 70CM CONE TIP (BALLOONS) IMPLANT
CATH URTH STD 24FR FL 3W 2 (CATHETERS) ×2 IMPLANT
CLOTH BEACON ORANGE TIMEOUT ST (SAFETY) ×3 IMPLANT
ELECT BUTTON BIOP 24F 90D PLAS (MISCELLANEOUS) IMPLANT
ELECT LOOP MED HF 24F 12D (CUTTING LOOP) IMPLANT
ELECT REM PT RETURN 9FT ADLT (ELECTROSURGICAL)
ELECTRODE REM PT RTRN 9FT ADLT (ELECTROSURGICAL) ×2 IMPLANT
EVACUATOR MICROVAS BLADDER (UROLOGICAL SUPPLIES) ×1 IMPLANT
FIBER LASER FLEXIVA 1000 (UROLOGICAL SUPPLIES) ×1 IMPLANT
FIBER LASER FLEXIVA 365 (UROLOGICAL SUPPLIES) IMPLANT
FIBER LASER FLEXIVA 550 (UROLOGICAL SUPPLIES) IMPLANT
FIBER LASER TRAC TIP (UROLOGICAL SUPPLIES) IMPLANT
GLOVE BIO SURGEON STRL SZ 6.5 (GLOVE) ×1 IMPLANT
GLOVE BIO SURGEON STRL SZ8 (GLOVE) ×3 IMPLANT
GLOVE BIOGEL PI IND STRL 6.5 (GLOVE) IMPLANT
GLOVE BIOGEL PI INDICATOR 6.5 (GLOVE) ×2
GOWN STRL REUS W/ TWL LRG LVL3 (GOWN DISPOSABLE) ×2 IMPLANT
GOWN STRL REUS W/ TWL XL LVL3 (GOWN DISPOSABLE) ×2 IMPLANT
GOWN STRL REUS W/TWL LRG LVL3 (GOWN DISPOSABLE) ×3
GOWN STRL REUS W/TWL XL LVL3 (GOWN DISPOSABLE) ×3
GOWN XL W/COTTON TOWEL STD (GOWNS) ×3 IMPLANT
GUIDEWIRE 0.038 PTFE COATED (WIRE) ×3 IMPLANT
GUIDEWIRE ANG ZIPWIRE 038X150 (WIRE) IMPLANT
GUIDEWIRE STR DUAL SENSOR (WIRE) ×3 IMPLANT
HOLDER FOLEY CATH W/STRAP (MISCELLANEOUS) ×1 IMPLANT
IV NS IRRIG 3000ML ARTHROMATIC (IV SOLUTION) ×8 IMPLANT
KIT BALLIN UROMAX 15FX10 (LABEL) IMPLANT
KIT BALLN UROMAX 15FX4 (MISCELLANEOUS) IMPLANT
KIT BALLN UROMAX 26 75X4 (MISCELLANEOUS)
LOOP CUT BIPOLAR 24F LRG (ELECTROSURGICAL) ×1 IMPLANT
MANIFOLD NEPTUNE II (INSTRUMENTS) ×1 IMPLANT
NS IRRIG 500ML POUR BTL (IV SOLUTION) ×1 IMPLANT
PACK CYSTO (CUSTOM PROCEDURE TRAY) ×3 IMPLANT
PLUG CATH AND CAP STER (CATHETERS) IMPLANT
SET ASPIRATION TUBING (TUBING) ×1 IMPLANT
SET HIGH PRES BAL DIL (LABEL)
SYR 30ML LL (SYRINGE) ×1 IMPLANT
SYRINGE IRR TOOMEY STRL 70CC (SYRINGE) IMPLANT
WATER STERILE IRR 3000ML UROMA (IV SOLUTION) ×1 IMPLANT

## 2015-08-13 NOTE — Transfer of Care (Signed)
Immediate Anesthesia Transfer of Care Note  Patient: [name]  Procedure(s) Performed: Procedure(s): CYSTOSCOPY WITH LITHOLAPAXY (N/A) TRANSURETHRAL RESECTION OF THE PROSTATE (TURP) (N/A) HOLMIUM LASER APPLICATION (N/A)  Patient Location: PACU  Anesthesia Type:General  Level of Consciousness: awake  Airway & Oxygen Therapy: Patient Spontanous Breathing and Patient connected to nasal cannula oxygen  Post-op Assessment: Report given to RN  Post vital signs: Reviewed and stable  Last Vitals:  Filed Vitals:   08/13/15 0638  BP: 137/85  Pulse: 74  Temp: 37 C  Resp: 14    Complications: No apparent anesthesia complications

## 2015-08-13 NOTE — Progress Notes (Addendum)
RN notified the MD on call that the patient would like Xanax for tonight. RN having no success with the [phone] number. Will look up to see who is the on call urologist listed on [place].

## 2015-08-13 NOTE — Anesthesia Procedure Notes (Signed)
Procedure Name: LMA Insertion Date/Time: 08/13/2015 7:31 AM Performed by: Bethena Roys T Pre-anesthesia Checklist: Patient identified, Emergency Drugs available, Suction available and Patient being monitored Patient Re-evaluated:Patient Re-evaluated prior to inductionOxygen Delivery Method: Circle System Utilized Preoxygenation: Pre-oxygenation with 100% oxygen Intubation Type: IV induction Ventilation: Mask ventilation without difficulty LMA: LMA with gastric port inserted LMA Size: 5.0 Number of attempts: 1 Placement Confirmation: positive ETCO2 Tube secured with: Tape Dental Injury: Teeth and Oropharynx as per pre-operative assessment

## 2015-08-13 NOTE — Progress Notes (Signed)
RN attempted to call the [phone] number with no success. RN trying to reach the on call urologist listed on [place].

## 2015-08-13 NOTE — Op Note (Addendum)
PATIENT:  Barbette Or  PRE-OPERATIVE DIAGNOSIS: 1. BPH with outlet obstruction 2. Bladder calculus  POST-OPERATIVE DIAGNOSIS: Same  PROCEDURE: 1. TURP 2. Cystolitholapaxy (2.6cm)  SURGEON:  Claybon Jabs  INDICATION: MARCELL CHAVARIN is a 69 year old male with a history of BPH with outlet obstruction. He's also had a stone in the past. He redeveloped significant irritative voiding symptoms and was found to have a large bladder stone in the bladder. We discussed treatment of the bladder stone and also treatment of his remaining prostatic tissue that was causing outlet obstruction and placing him at an increased risk for further stone formation. The risks of performing a transurethral resection in a patient with Parkinson's disease was discussed. A preop culture was negative.  ANESTHESIA:  General  EBL:  Minimal  DRAINS: 41 French, three-way Foley catheter with 30 mL balloon  LOCAL MEDICATIONS USED:  None  SPECIMEN:  1. Stone given to patient. 2. Prostatic chips to pathology  After informed consent the patient was brought to the major OR and placed on the table. He was administered general anesthesia and then moved to the dorsal lithotomy position. His genitalia was sterilely prepped and draped and an official timeout was then performed.   The 69 French cystoscope with 30 lens was passed down the urethra with no abnormality having been noted in the urethra. The prostatic urethra revealed bilobar hypertrophy with evidence of previous resection of a median lobe. Upon entering the bladder a single large stone was noted on the floor. Ureteral orifices were noted to be of normal configuration and position and well away from the bladder neck.  A 1000  holmium laser fiber was then passed through the cystoscope and used to fragment the stone. All of the stone fragments were then evacuated from the bladder using a Microvasive evacuator and reinspection revealed all stone had been evacuated  and there was no evidence of perforation or mucosal injury. The cystoscope was therefore removed.  I then switched to  the 28 French resectoscope sheath with the visual obturator which  was passed into the bladder and the obturator removed. I then inserted the resectoscope element with 30 lens.  Resection was then begun.  I then began resecting the left lobe of the prostate by resecting first from the level of the bladder neck back to the level of the Veru at the 5:00 position and then progressed in a counterclockwise direction resecting all of the adenomatous tissue of the left lobe down to the surgical capsule. Bleeding points were cauterized as they were encountered. I then turned my attention to the right lobe of the prostate and it was resected in an identical fashion. Tissue in the area of the apex was then resected circumferentially with care being taken to maintain the resection proximal to the Veru at all times. The prostatic chips were then flushed into the bladder and the Microvasive evacuator was then used to evacuate all chips from the bladder. Reinspection of the bladder revealed the mucosa to be intact, the ureteral orifices intact as well and well away from the bladder neck and area of resection. There were no prostatic chips remaining within the bladder. The prostatic capsule was intact throughout with no perforation and there was no active bleeding noted at the end of the procedure.  The resectoscope was therefore removed and the 20 French three-way Foley catheter was then inserted and balloon was filled to 30 cc. This was irrigated with the irrigant returning clear. The catheter was then  hooked to closed system drainage and continuous irrigation and the patient was awakened and taken to recovery room in stable and satisfactory condition. He tolerated the procedure well and there were no intraoperative complications.    PLAN OF CARE: Overnight observation with discharge in the morning    PATIENT DISPOSITION:  PACU - hemodynamically stable.

## 2015-08-13 NOTE — H&P (View-Only) (Signed)
Urology Consult   Physician requesting consult: Dr. Christina Rama, MD  Reason for consult: cystolithiasis and cystitis  History of Present Illness: [name] is a 69 y.o. M with history of demetia, CVA, bipolar disorder, HTN, LUTS and history of bladder stones who presented to the [place] ED 07/20/15 with failure to thrive including nausea and emesis with significantly decreased PO intake. This has been getting gradually worse since he was last seen in Urology clinic with Dr. Ottelin 07/08/15 at which point a urine culture grew out >100 k coag neg staph sensitive to doxycylcine which he has been taking. No fevers or chills, however, developed left sided chest tightness without radiation. Serum labs were within the normal range with Cr at 0.83 and WBC 6,000. Urinalysis demonstrated mild infection parameters as well as CaOx crystals. CT stone protocol demonstrated mild cystitis and a 2.1 cm bladder stone with prostatic enlargement. No evidence of hydronephrosis and "mild perinephric stranding" which is not impressive. He was admitted to the hospitalist service for management of multiple medical issues. Blood and urine cultures pending, troponins negative.  He was last seen in clinic by Dr. Ottelin 07/07/15 at which time he was seen again for recurrent urinary tract infections although no prior positive cultures were found. He was also having persistent LUTS with incontinence despite medical management with Toviaz and Myrbetriq. 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 He has had persistent vague pelvic pain over the last 2 months. Close follow-up was planned for next week with plans for cystoscopy to evaluate for likely bladder calculus. Prior to admission he was having issues voiding and arrived at the ED in retention. Foley catheter was placed on the floor with some relief. He had completed total 14 day course of doxycyline prior to  admission.  Past Medical History  Diagnosis Date  . Hyperlipidemia   . Vitamin B12 deficiency   . Occasional tremors     leg tremors  . BPH (benign prostatic hypertrophy) with urinary obstruction   . History of CVA (cerebrovascular accident) without residual deficits     2011  . Other degenerative diseases of the basal ganglia     S/P CVA 2011--  CHRONIC LEFT BASAL GANGLIA LACUNAR INFARCT  PER CT  . History of nonmelanoma skin cancer     EXCISION OF NOSE  . Hypertension   . Bladder stone   . Schizophrenia     HX PSYCHIATRIC ADMISSION'S  AND ECT (SHOCK) [place]  . Bipolar affective disorder   . ED (erectile dysfunction) of organic origin   . Dementia   . Depression   . Constipation   . Generalized weakness     LOWER EXTREMITIES  . Chronic anxiety   . Urinary bladder stone 08/12/2014    Past Surgical History  Procedure Laterality Date  . Transanal excision anal and rectal polyps  11-02-2000  . Transthoracic echocardiogram  02-03-2012    GRADE I DIASTOLIC DYSFUNCTION/  EF 60-65%  . Cardiovascular stress test  06-18-2013  DR PETER NISHAN    LOW RISK NO EXERCISE LEXISCAN STUDY/  NO ISCHEMIA, CANNOT RULE OUT BASAL INFERIOR INFARCTION BUT POSSIBLE ARTIFACT GIVEN NORMAL WALL MOTION/  EF 73%  . Excision tumor upper left thigh  2012  . Transurethral incision of prostate N/A 10/03/2013    Procedure: TRANSURETHRAL INCISION OF THE PROSTATE (TUIP);  Surgeon: Mark C Ottelin, MD;  Location: [facility];  Service:   Urology;  Laterality: N/A;  . Cystoscopy with litholapaxy N/A 10/03/2013    Procedure: CYSTOSCOPY WITH LITHOLAPAXY;  Surgeon: Mark C Ottelin, MD;  Location: [facility];  Service: Urology;  Laterality: N/A;  . Holmium laser application N/A 10/03/2013    Procedure: HOLMIUM LASER APPLICATION;  Surgeon: Mark C Ottelin, MD;  Location: [facility];  Service: Urology;  Laterality: N/A;    Current Hospital Medications:  Home Meds:     Medication List    ASK your doctor about these medications        ALPRAZolam 0.5 MG tablet  Commonly known as:  XANAX  TAKE 1 TABLET BY MOUTH THREE TIMES DAILY AS NEEDED FOR ANXIETY     amLODipine 10 MG tablet  Commonly known as:  NORVASC  Take 1 tablet (10 mg total) by mouth daily.     atorvastatin 10 MG tablet  Commonly known as:  LIPITOR     carbidopa-levodopa 25-100 MG per tablet  Commonly known as:  SINEMET IR  Take 1 tablet by mouth 3 (three) times daily.     ciprofloxacin 250 MG tablet  Commonly known as:  CIPRO  Take 1 tablet (250 mg total) by mouth 2 (two) times daily.     cyanocobalamin 1000 MCG tablet  Take 1 tablet (1,000 mcg total) by mouth daily.     DULCOLAX 5 MG EC tablet  Generic drug:  bisacodyl  Take 15 mg by mouth daily as needed (For constipation.).     Fish Oil 1200 MG Caps  Take 2,400 mg by mouth daily.     FLAXSEED OIL PO  Take 2 capsules by mouth daily.     mirtazapine 15 MG tablet  Commonly known as:  REMERON  Take 1 tablet (15 mg total) by mouth at bedtime.     omega-3 acid ethyl esters 1 G capsule  Commonly known as:  LOVAZA  Take 3 g by mouth daily.     ondansetron 4 MG tablet  Commonly known as:  ZOFRAN  TAKE 1 TABLET BY MOUTH EVERY 8 HOURS AS NEEDED FOR NAUSEA OR VOMITING     pantoprazole 40 MG tablet  Commonly known as:  PROTONIX  Take 1 tablet (40 mg total) by mouth daily.     polyethylene glycol powder powder  Commonly known as:  MIRALAX  Take 17 g by mouth daily.     promethazine 12.5 MG tablet  Commonly known as:  PHENERGAN  Take 1 tablet (12.5 mg total) by mouth every 8 (eight) hours as needed for nausea or vomiting.     tamsulosin 0.4 MG Caps capsule  Commonly known as:  FLOMAX  Take 1 capsule (0.4 mg total) by mouth daily after supper.     TOVIAZ 8 MG Tb24 tablet  Generic drug:  fesoterodine  Take 8 mg by mouth daily.     traMADol 50 MG tablet  Commonly known as:  ULTRAM  Take 1 tablet (50 mg total) by  mouth every 8 (eight) hours as needed.        Scheduled Meds: . amLODipine  10 mg Oral Daily  . atorvastatin  10 mg Oral q1800  . cefTRIAXone (ROCEPHIN)  IV  1 g Intravenous Q24H  . mirtazapine  15 mg Oral QHS  . pantoprazole  40 mg Oral Daily  . phenazopyridine  100 mg Oral TID WC  . potassium chloride  20 mEq Oral BID  . sodium chloride  3 mL Intravenous Q12H  .   tamsulosin  0.4 mg Oral QPC supper   Continuous Infusions: . sodium chloride 75 mL/hr at 07/20/15 1154   PRN Meds:.acetaminophen **OR** acetaminophen, ALPRAZolam, bisacodyl, ondansetron **OR** ondansetron (ZOFRAN) IV, oxyCODONE, traMADol  Allergies: No Known Allergies  Family History  Problem Relation Age of Onset  . Dementia Father   . Dementia Mother   . Colon cancer Neg Hx   . Colon polyps Neg Hx   . Rectal cancer Neg Hx   . Stomach cancer Neg Hx     Social History:  reports that he has been smoking Cigarettes.  He has a 24 pack-year smoking history. He has never used smokeless tobacco. He reports that he does not drink alcohol or use illicit drugs.  ROS: A complete review of systems was performed.  All systems are negative except for pertinent findings as noted.  Physical Exam:  Vital signs in last 24 hours: Temp:  [98.5 F (36.9 C)-99.5 F (37.5 C)] 98.7 F (37.1 C) (08/16 1317) Pulse Rate:  [72-92] 72 (08/16 1317) Resp:  [11-17] 12 (08/16 1317) BP: (115-156)/(69-96) 118/69 mmHg (08/16 1409) SpO2:  [95 %-97 %] 97 % (08/16 1317) Weight:  [87.3 kg (192 lb 7.4 oz)] 87.3 kg (192 lb 7.4 oz) (08/16 0345) Constitutional:  Alert and oriented, No acute distress Cardiovascular: Regular rate and rhythm, No JVD Respiratory: Normal respiratory effort, Lungs clear bilaterally GI: Abdomen is soft, nontender, nondistended, no abdominal masses GU: No CVA tenderness, 16 Fr foley catheter in place draining clear,amber colored urine Lymphatic: No lymphadenopathy Neurologic: Grossly intact, no focal  deficits Psychiatric: Normal mood and affect  Laboratory Data:   Recent Labs  07/19/15 1843 07/20/15 0750  WBC 9.6 6.9  HGB 15.3 14.6  HCT 43.9 42.2  PLT 229 213     Recent Labs  07/19/15 1843 07/20/15 0750  NA 137 142  K 3.4* 3.3*  CL 103 107  GLUCOSE 111* 94  BUN 12 9  CALCIUM 9.2 8.8*  CREATININE 1.06 0.83   8/4 UCx coag neg staph sensitive to oxacilin, cefazolin, gent, macrobid, doxy,vanc,rifampin (R to cipro, bactrim, I-levo)  Results for orders placed or performed during the hospital encounter of 07/19/15 (from the past 24 hour(s))  Basic metabolic panel     Status: Abnormal   Collection Time: 07/19/15  6:43 PM  Result Value Ref Range   Sodium 137 135 - 145 mmol/L   Potassium 3.4 (L) 3.5 - 5.1 mmol/L   Chloride 103 101 - 111 mmol/L   CO2 25 22 - 32 mmol/L   Glucose, Bld 111 (H) 65 - 99 mg/dL   BUN 12 6 - 20 mg/dL   Creatinine, Ser 1.06 0.61 - 1.24 mg/dL   Calcium 9.2 8.9 - 10.3 mg/dL   GFR calc non Af Amer >60 >60 mL/min   GFR calc Af Amer >60 >60 mL/min   Anion gap 9 5 - 15  CBC     Status: None   Collection Time: 07/19/15  6:43 PM  Result Value Ref Range   WBC 9.6 4.0 - 10.5 K/uL   RBC 4.89 4.22 - 5.81 MIL/uL   Hemoglobin 15.3 13.0 - 17.0 g/dL   HCT 43.9 39.0 - 52.0 %   MCV 89.8 78.0 - 100.0 fL   MCH 31.3 26.0 - 34.0 pg   MCHC 34.9 30.0 - 36.0 g/dL   RDW 13.0 11.5 - 15.5 %   Platelets 229 150 - 400 K/uL  Urinalysis, Routine w reflex microscopic (not at [facility])       Status: Abnormal   Collection Time: 07/19/15  7:23 PM  Result Value Ref Range   Color, Urine RED (A) YELLOW   APPearance TURBID (A) CLEAR   Specific Gravity, Urine 1.028 1.005 - 1.030   pH 6.0 5.0 - 8.0   Glucose, UA NEGATIVE NEGATIVE mg/dL   Hgb urine dipstick LARGE (A) NEGATIVE   Bilirubin Urine MODERATE (A) NEGATIVE   Ketones, ur NEGATIVE NEGATIVE mg/dL   Protein, ur >300 (A) NEGATIVE mg/dL   Urobilinogen, UA 1.0 0.0 - 1.0 mg/dL   Nitrite NEGATIVE NEGATIVE   Leukocytes, UA  SMALL (A) NEGATIVE  Urine microscopic-add on     Status: Abnormal   Collection Time: 07/19/15  7:23 PM  Result Value Ref Range   Squamous Epithelial / LPF RARE RARE   WBC, UA 3-6 <3 WBC/hpf   RBC / HPF TOO NUMEROUS TO COUNT <3 RBC/hpf   Bacteria, UA FEW (A) RARE   Crystals CA OXALATE CRYSTALS (A) NEGATIVE  Troponin I     Status: None   Collection Time: 07/19/15  9:13 PM  Result Value Ref Range   Troponin I <0.03 <0.031 ng/mL  Brain natriuretic peptide     Status: None   Collection Time: 07/19/15  9:38 PM  Result Value Ref Range   B Natriuretic Peptide 42.1 0.0 - 100.0 pg/mL  I-Stat CG4 Lactic Acid, ED     Status: None   Collection Time: 07/19/15  9:47 PM  Result Value Ref Range   Lactic Acid, Venous 0.81 0.5 - 2.0 mmol/L  Blood culture (routine x 2)     Status: None (Preliminary result)   Collection Time: 07/19/15 11:40 PM  Result Value Ref Range   Specimen Description BLOOD RIGHT HAND    Special Requests BOTTLES DRAWN AEROBIC AND ANAEROBIC 5ML    Culture PENDING    Report Status PENDING   Blood culture (routine x 2)     Status: None (Preliminary result)   Collection Time: 07/19/15 11:46 PM  Result Value Ref Range   Specimen Description BLOOD LEFT FOREARM    Special Requests BOTTLES DRAWN AEROBIC AND ANAEROBIC 5ML    Culture PENDING    Report Status PENDING   I-Stat CG4 Lactic Acid, ED     Status: None   Collection Time: 07/20/15 12:51 AM  Result Value Ref Range   Lactic Acid, Venous 0.83 0.5 - 2.0 mmol/L  Troponin I     Status: None   Collection Time: 07/20/15  2:20 AM  Result Value Ref Range   Troponin I <0.03 <0.031 ng/mL  Troponin I     Status: None   Collection Time: 07/20/15  7:50 AM  Result Value Ref Range   Troponin I <0.03 <0.031 ng/mL  Comprehensive metabolic panel     Status: Abnormal   Collection Time: 07/20/15  7:50 AM  Result Value Ref Range   Sodium 142 135 - 145 mmol/L   Potassium 3.3 (L) 3.5 - 5.1 mmol/L   Chloride 107 101 - 111 mmol/L   CO2 28 22  - 32 mmol/L   Glucose, Bld 94 65 - 99 mg/dL   BUN 9 6 - 20 mg/dL   Creatinine, Ser 0.83 0.61 - 1.24 mg/dL   Calcium 8.8 (L) 8.9 - 10.3 mg/dL   Total Protein 6.3 (L) 6.5 - 8.1 g/dL   Albumin 3.8 3.5 - 5.0 g/dL   AST 16 15 - 41 U/L   ALT 14 (L) 17 - 63 U/L   Alkaline Phosphatase 85   38 - 126 U/L   Total Bilirubin 1.0 0.3 - 1.2 mg/dL   GFR calc non Af Amer >60 >60 mL/min   GFR calc Af Amer >60 >60 mL/min   Anion gap 7 5 - 15  CBC     Status: None   Collection Time: 07/20/15  7:50 AM  Result Value Ref Range   WBC 6.9 4.0 - 10.5 K/uL   RBC 4.71 4.22 - 5.81 MIL/uL   Hemoglobin 14.6 13.0 - 17.0 g/dL   HCT 42.2 39.0 - 52.0 %   MCV 89.6 78.0 - 100.0 fL   MCH 31.0 26.0 - 34.0 pg   MCHC 34.6 30.0 - 36.0 g/dL   RDW 12.8 11.5 - 15.5 %   Platelets 213 150 - 400 K/uL  Protime-INR     Status: Abnormal   Collection Time: 07/20/15  7:50 AM  Result Value Ref Range   Prothrombin Time 15.4 (H) 11.6 - 15.2 seconds   INR 1.21 0.00 - 1.49  Troponin I     Status: None   Collection Time: 07/20/15  2:25 PM  Result Value Ref Range   Troponin I <0.03 <0.031 ng/mL   Recent Results (from the past 240 hour(s))  Blood culture (routine x 2)     Status: None (Preliminary result)   Collection Time: 07/19/15 11:40 PM  Result Value Ref Range Status   Specimen Description BLOOD RIGHT HAND  Final   Special Requests BOTTLES DRAWN AEROBIC AND ANAEROBIC 5ML  Final   Culture PENDING  Incomplete   Report Status PENDING  Incomplete  Blood culture (routine x 2)     Status: None (Preliminary result)   Collection Time: 07/19/15 11:46 PM  Result Value Ref Range Status   Specimen Description BLOOD LEFT FOREARM  Final   Special Requests BOTTLES DRAWN AEROBIC AND ANAEROBIC 5ML  Final   Culture PENDING  Incomplete   Report Status PENDING  Incomplete    Renal Function:  Recent Labs  07/19/15 1843 07/20/15 0750  CREATININE 1.06 0.83   Estimated Creatinine Clearance: 93.6 mL/min (by C-G formula based on Cr of  0.83).  Radiologic Imaging: Dg Chest 2 View  07/19/2015   CLINICAL DATA:  Pt has been on antibiotics since 7/7 for UTI. pt states urgency to pee but cant go. Given repeat antibiotics back to back. Symptoms continue. Pt states chest pain since yesterday with heaviness. Hx of HTN, BPH, CVA with other degenerative basal ganglia diseases, and dementia.  EXAM: CHEST  2 VIEW  COMPARISON:  12/16/2014  FINDINGS: Cardiac silhouette is normal in size and configuration. The aorta is uncoiled and tortuous. No mediastinal or hilar masses or evidence of adenopathy.  Lungs are mildly hyperexpanded but clear. No pleural effusion or pneumothorax.  Bony thorax is intact.  IMPRESSION: No acute cardiopulmonary disease.   Electronically Signed   By: David  Ormond M.D.   On: 07/19/2015 19:08   Ct Renal Stone Study  07/20/2015   CLINICAL DATA:  Acute onset of lower back pain for 2 days. Hematuria. Initial encounter.  EXAM: CT ABDOMEN AND PELVIS WITHOUT CONTRAST  TECHNIQUE: Multidetector CT imaging of the abdomen and pelvis was performed following the standard protocol without IV contrast.  COMPARISON:  CT of the abdomen and pelvis from 11/01/2013  FINDINGS: The visualized lung bases are clear.  The liver and spleen are unremarkable in appearance. The gallbladder is within normal limits. The pancreas and adrenal glands are unremarkable.  Mild nonspecific perinephric stranding is noted bilaterally. The   kidneys are otherwise unremarkable. There is no evidence of hydronephrosis. No renal or ureteral stones are seen.  No free fluid is identified. The small bowel is unremarkable in appearance. The stomach is within normal limits. No acute vascular abnormalities are seen. Mild scattered calcification is noted along the abdominal aorta and its branches.  The appendix is normal in caliber and contains air, without evidence of appendicitis. The colon is unremarkable in appearance.  The bladder is mildly distended. Mild bladder wall  thickening could reflect mild cystitis, or depending on the patient's symptoms, cystoscopy could be considered to exclude a mass. A large 2.1 cm stone is noted at the right side of the base of the bladder. The prostate is borderline enlarged, measuring 4.8 cm in transverse dimension. No inguinal lymphadenopathy is seen.  No acute osseous abnormalities are identified.  IMPRESSION: 1. Mild bladder wall thickening could reflect mild cystitis, or depending on the patient's symptoms, cystoscopy could be considered to exclude a mass. 2. Large 2.1 cm right bladder stone noted. 3. Borderline enlarged prostate. 4. Mild scattered calcification along the abdominal aorta and its branches.   Electronically Signed   By: Jeffery  Chang M.D.   On: 07/20/2015 00:05    I independently reviewed the above imaging studies.  Impression/Recommendation  69yM with multiple medical issues including Parkinsonism and dementia as well as history of obstructive uropathy s/p TUIP in 2014 with recent culture proven cystitis in the setting of newly identified 2cm bladder calculus which is likely the source of retention and LUTS and may be nidus for infection.  1. Presumed Cystitis-culture from 8/4 growing coag neg staph sensitive to cefazolin, doxycycline, R to cipro. Agree with empiric Ceftriaxone treatment until culture sensitivities result. Follow-up culture results, UA findings and symptoms may be secondary to stone. If positive will need 14 day course of treatment for complicated cystitis.  2. Bladder calculus-2.1 cm noted on non-cont CT scan, likely contributing to both infections and LUTS. Will ultimately need cystolitholapaxy once urine is sterilized. We will discuss timing of this procedure (during this admission versus outpatient scheduling).  3. Lower Urinary Tract Symptoms-likely secondary to the above.Foley catheter in place draining amber urine. Would continue foley until definitive stone treatment as it sounds like it  has been ball-valving and preventing appropriate voiding.  Discussed with Dr. Herrick  Matthew Lyons 07/20/2015, 4:07 PM    I performed a history and physical examination of the patient and discussed his management with the resident.  I reviewed the resident's note and agree with the documented findings and plan of care.   I think leaving his foley catheter in and letting him go home with it until his bladder stone is removed is most logical and convenient for the patient.  He will need to stay on abx until the procedure.  I will touch base with Dr. Ottelin his primary urologist and hopefully plan a procedure within the next 2 weeks to remove his bladder stone.  We will continue to follow.  

## 2015-08-13 NOTE — Anesthesia Postprocedure Evaluation (Signed)
  Anesthesia Post-op Note  Patient: [name]  Procedure(s) Performed: Procedure(s) (LRB): CYSTOSCOPY WITH LITHOLAPAXY (N/A) TRANSURETHRAL RESECTION OF THE PROSTATE (TURP) (N/A) HOLMIUM LASER APPLICATION (N/A)  Patient Location: PACU  Anesthesia Type: General  Level of Consciousness: awake and alert   Airway and Oxygen Therapy: Patient Spontanous Breathing  Post-op Pain: mild  Post-op Assessment: Post-op Vital signs reviewed, Patient's Cardiovascular Status Stable, Respiratory Function Stable, Patent Airway and No signs of Nausea or vomiting  Last Vitals:  Filed Vitals:   08/13/15 0930  BP: 146/90  Pulse: 83  Temp:   Resp: 19    Post-op Vital Signs: stable   Complications: No apparent anesthesia complications

## 2015-08-13 NOTE — Interval H&P Note (Signed)
History and Physical Interval Note:  08/13/2015 4:18 AM   I have discussed with the patient and his wife today the fact that his irritative voiding symptoms are most likely secondary to his bladder stone although with Parkinson's disease some of the symptoms may be secondary to his underlying disease condition. I have recommended cystoscopic management of his stone with cystolitholapaxy. He is undergone a previous transurethral incision of his prostate and we therefore discussed proceeding with further outlet reductive surgery such as a TURP. The rationale behind this would be to decrease outlet resistance and hopefully reduce his risk of stone formation however there is an associated risk of worsening incontinence because of his underlying Parkinson's disease. Because of this we discussed the pluses and minuses of further outlet reduction surgery versus the continued possible risk of redevelopment of bladder stone formation with no reduction of outlet resistance. We also discussed his comorbidities and how these would impact our decision.    We discussed the planned procedure in detail including its risks and complications, the alternatives as outlined above, the probability of success and the anticipated postoperative course. All questions were answered to their satisfaction and he has elected to proceed with cystolitholapaxy and TURP.     Barbette Or  has presented today for surgery, with the diagnosis of BLADDER STONE AND BENIGN PROSTATIC HYPERPLASIA  The various methods of treatment have been discussed with the patient and family. After consideration of risks, benefits and other options for treatment, the patient has consented to  Procedure(s): CYSTOSCOPY WITH LITHOLAPAXY (N/A) TRANSURETHRAL RESECTION OF THE PROSTATE (TURP) (N/A) HOLMIUM LASER APPLICATION (N/A) as a surgical intervention .  The patient's history has been reviewed, patient examined, no change in status, stable for surgery.  I  have reviewed the patient's chart and labs.  Questions were answered to the patient's satisfaction.     Claybon Jabs

## 2015-08-13 NOTE — Progress Notes (Signed)
He reports he is doing well. He is not having any pain. No nausea.  AVSS Abdomen is soft nontender Catheter is draining on CBI and is clear at a very slow drip with no clots.  He is doing well postoperatively. He is currently eating and has a good appetite. He has no pain. He should be able to go home in the morning once his catheter is then removed.  The plan will be as per his postoperative orders with anticipated discharge in the morning.

## 2015-08-13 NOTE — Discharge Instructions (Signed)

## 2015-08-13 NOTE — Anesthesia Preprocedure Evaluation (Signed)
Anesthesia Evaluation  Patient identified by MRN, date of birth, ID band Patient awake    Reviewed: Allergy & Precautions, H&P , NPO status , Patient's Chart, lab work & pertinent test results  Airway Mallampati: II  TM Distance: >3 FB Neck ROM: Limited    Dental no notable dental hx.    Pulmonary neg pulmonary ROS, Current Smoker,    Pulmonary exam normal breath sounds clear to auscultation       Cardiovascular hypertension, Pt. on medications Normal cardiovascular exam Rhythm:Regular Rate:Normal     Neuro/Psych Anxiety Bipolar Disorder Dementia schizophrenia Parkinson's CVA, No Residual Symptoms    GI/Hepatic negative GI ROS, Neg liver ROS,   Endo/Other  negative endocrine ROS  Renal/GU negative Renal ROS  negative genitourinary   Musculoskeletal negative musculoskeletal ROS (+)   Abdominal   Peds negative pediatric ROS (+)  Hematology negative hematology ROS (+)   Anesthesia Other Findings   Reproductive/Obstetrics negative OB ROS                             Anesthesia Physical  Anesthesia Plan  ASA: III  Anesthesia Plan: General   Post-op Pain Management:    Induction: Intravenous  Airway Management Planned: LMA  Additional Equipment:   Intra-op Plan:   Post-operative Plan:   Informed Consent: I have reviewed the patients History and Physical, chart, labs and discussed the procedure including the risks, benefits and alternatives for the proposed anesthesia with the patient or authorized representative who has indicated his/her understanding and acceptance.   Dental advisory given  Plan Discussed with: CRNA and Surgeon  Anesthesia Plan Comments:         Anesthesia Quick Evaluation

## 2015-08-14 DIAGNOSIS — N401 Enlarged prostate with lower urinary tract symptoms: Secondary | ICD-10-CM | POA: Diagnosis not present

## 2015-08-14 NOTE — Discharge Summary (Signed)
Physician Discharge Summary  Patient ID: [name] MRN: [id number] DOB/AGE: [birth year] 69 y.o.  Admit date: 08/13/2015 Discharge date: 08/14/2015  Admission Diagnoses: BPH, Bladder Stones  Discharge Diagnoses:  Active Problems:   BPH with urinary obstruction   Discharged Condition: good  Hospital Course:   1 - BPH with Bladder Stones - pt underwent TURP with cystolithalopexy 02/07/4826 without complications. Admitted to 4th floor Urology service post-op for observation. By 9/10, the day of discharge, he is ambulatory, voiding to completion without catheter and w/o wig blood clots, tolerating regular diet, pain controlled, and felt to be adequate for discharge.   Consults: None  Significant Diagnostic Studies: labs: pathology - pending  Treatments: surgery:  TURP with cystolithalopexy 08/13/2015   Discharge Exam: Blood pressure 141/87, pulse 65, temperature 98.1 F (36.7 C), temperature source Oral, resp. rate 16, height 6\' 1"  (1.854 m), weight 87.544 kg (193 lb), SpO2 96 %. General appearance: alert, cooperative, appears stated age and family at bedside Eyes: negative Nose: Nares normal. Septum midline. Mucosa normal. No drainage or sinus tenderness. Throat: lips, mucosa, and tongue normal; teeth and gums normal Neck: no adenopathy, no carotid bruit, no JVD, supple, symmetrical, trachea midline and thyroid not enlarged, symmetric, no tenderness/mass/nodules Back: symmetric, no curvature. ROM normal. No CVA tenderness. Resp: non-labored on room air Cardio: Nl rate GI: soft, non-tender; bowel sounds normal; no masses,  no organomegaly Male genitalia: normal Extremities: extremities normal, atraumatic, no cyanosis or edema Skin: Skin color, texture, turgor normal. No rashes or lesions Lymph nodes: Cervical, supraclavicular, and axillary nodes normal. Neurologic: Grossly normal  Disposition: 01-Home or Self Care     Medication List    STOP taking these medications         tamsulosin 0.4 MG Caps capsule  Commonly known as:  FLOMAX      TAKE these medications        ALPRAZolam 0.5 MG tablet  Commonly known as:  XANAX  TAKE 1 TABLET BY MOUTH THREE TIMES DAILY AS NEEDED FOR ANXIETY     amLODipine 10 MG tablet  Commonly known as:  NORVASC  Take 1 tablet (10 mg total) by mouth daily.     amLODipine 5 MG tablet  Commonly known as:  NORVASC  Take 1 tablet by mouth daily.     carbidopa-levodopa 25-100 MG per tablet  Commonly known as:  SINEMET IR  Take 1 tablet by mouth 3 (three) times daily.     cyanocobalamin 1000 MCG tablet  Take 1 tablet (1,000 mcg total) by mouth daily.     DULCOLAX PO  Take 3 tablets by mouth at bedtime as needed (const).     FLAXSEED OIL PO  Take 2 capsules by mouth daily.     HYDROcodone-acetaminophen 10-325 MG per tablet  Commonly known as:  NORCO  Take 1-2 tablets by mouth every 4 (four) hours as needed for moderate pain. Maximum dose per 24 hours -8 pills.     ibuprofen 200 MG tablet  Commonly known as:  ADVIL,MOTRIN  Take 200-400 mg by mouth every 6 (six) hours as needed for headache or moderate pain.     mirtazapine 15 MG tablet  Commonly known as:  REMERON  Take 1 tablet (15 mg total) by mouth at bedtime.     nitrofurantoin (macrocrystal-monohydrate) 100 MG capsule  Commonly known as:  MACROBID  Take 1 capsule by mouth at bedtime. Total 10, has 7 left     omega-3 acid ethyl esters 1 G capsule  Commonly known as:  LOVAZA  Take 3 g by mouth daily.     ondansetron 4 MG tablet  Commonly known as:  ZOFRAN  Take 1 tablet (4 mg total) by mouth every 6 (six) hours as needed for nausea.     oxybutynin 5 MG tablet  Commonly known as:  DITROPAN  Take 1 tablet by mouth every 8 (eight) hours as needed. Bladder spasms     pantoprazole 40 MG tablet  Commonly known as:  PROTONIX  Take 1 tablet (40 mg total) by mouth daily.     TOVIAZ 8 MG Tb24 tablet  Generic drug:  fesoterodine  Take 8 mg by mouth daily.            Follow-up Information    Follow up with Claybon Jabs, MD On 08/27/2015.   Specialty:  Urology   Why:  For your appiontment at 9:00   Contact information:   [place] [address] [phone]       Signed: Alexis Frock 08/14/2015, 8:23 AM

## 2015-08-14 NOTE — Progress Notes (Signed)
Pt and wife educated on after care Post TURP. Discharge papers given. No questions as of now.

## 2015-08-16 ENCOUNTER — Telehealth: Payer: Self-pay | Admitting: *Deleted

## 2015-08-16 ENCOUNTER — Encounter (HOSPITAL_BASED_OUTPATIENT_CLINIC_OR_DEPARTMENT_OTHER): Payer: Self-pay | Admitting: Urology

## 2015-08-16 NOTE — Telephone Encounter (Signed)
Pt was on tcm list d/c 08/14/15 dx BPH, Bladder Stone. He had procedure TURP w/cystolithal. Pt will be f/u with Dr. Loel Lofty did  Not make appt with PCP...[name]

## 2015-08-22 ENCOUNTER — Emergency Department (HOSPITAL_COMMUNITY)
Admission: EM | Admit: 2015-08-22 | Discharge: 2015-08-22 | Disposition: A | Discharge status: Home / Self Care | Payer: Medicare Other | Attending: Emergency Medicine | Admitting: Emergency Medicine

## 2015-08-22 ENCOUNTER — Encounter (HOSPITAL_COMMUNITY): Payer: Self-pay

## 2015-08-22 ENCOUNTER — Emergency Department (HOSPITAL_COMMUNITY): Payer: Medicare Other

## 2015-08-22 DIAGNOSIS — Z79899 Other long term (current) drug therapy: Secondary | ICD-10-CM | POA: Insufficient documentation

## 2015-08-22 DIAGNOSIS — I10 Essential (primary) hypertension: Secondary | ICD-10-CM | POA: Diagnosis not present

## 2015-08-22 DIAGNOSIS — Z87448 Personal history of other diseases of urinary system: Secondary | ICD-10-CM | POA: Insufficient documentation

## 2015-08-22 DIAGNOSIS — R339 Retention of urine, unspecified: Secondary | ICD-10-CM | POA: Diagnosis not present

## 2015-08-22 DIAGNOSIS — Z85828 Personal history of other malignant neoplasm of skin: Secondary | ICD-10-CM | POA: Diagnosis not present

## 2015-08-22 DIAGNOSIS — Z8673 Personal history of transient ischemic attack (TIA), and cerebral infarction without residual deficits: Secondary | ICD-10-CM | POA: Diagnosis not present

## 2015-08-22 DIAGNOSIS — F319 Bipolar disorder, unspecified: Secondary | ICD-10-CM | POA: Insufficient documentation

## 2015-08-22 DIAGNOSIS — Z720 Tobacco use: Secondary | ICD-10-CM | POA: Insufficient documentation

## 2015-08-22 DIAGNOSIS — F209 Schizophrenia, unspecified: Secondary | ICD-10-CM | POA: Diagnosis not present

## 2015-08-22 DIAGNOSIS — R350 Frequency of micturition: Secondary | ICD-10-CM | POA: Diagnosis present

## 2015-08-22 DIAGNOSIS — F419 Anxiety disorder, unspecified: Secondary | ICD-10-CM | POA: Diagnosis not present

## 2015-08-22 DIAGNOSIS — E785 Hyperlipidemia, unspecified: Secondary | ICD-10-CM | POA: Diagnosis not present

## 2015-08-22 DIAGNOSIS — F0390 Unspecified dementia without behavioral disturbance: Secondary | ICD-10-CM | POA: Diagnosis not present

## 2015-08-22 DIAGNOSIS — K5900 Constipation, unspecified: Secondary | ICD-10-CM | POA: Diagnosis not present

## 2015-08-22 DIAGNOSIS — E538 Deficiency of other specified B group vitamins: Secondary | ICD-10-CM | POA: Diagnosis not present

## 2015-08-22 DIAGNOSIS — R1030 Lower abdominal pain, unspecified: Secondary | ICD-10-CM | POA: Diagnosis not present

## 2015-08-22 LAB — LIPASE, BLOOD: Lipase: 14 U/L — ABNORMAL LOW (ref 22–51)

## 2015-08-22 LAB — CBC WITH DIFFERENTIAL/PLATELET
BASOS PCT: 0 %
Basophils Absolute: 0 10*3/uL (ref 0.0–0.1)
EOS ABS: 0 10*3/uL (ref 0.0–0.7)
Eosinophils Relative: 0 %
HEMATOCRIT: 39.9 % (ref 39.0–52.0)
HEMOGLOBIN: 14.3 g/dL (ref 13.0–17.0)
LYMPHS ABS: 0.9 10*3/uL (ref 0.7–4.0)
Lymphocytes Relative: 7 %
MCH: 31.8 pg (ref 26.0–34.0)
MCHC: 35.8 g/dL (ref 30.0–36.0)
MCV: 88.7 fL (ref 78.0–100.0)
MONO ABS: 0.4 10*3/uL (ref 0.1–1.0)
MONOS PCT: 3 %
NEUTROS PCT: 90 %
Neutro Abs: 12.2 10*3/uL — ABNORMAL HIGH (ref 1.7–7.7)
Platelets: 207 10*3/uL (ref 150–400)
RBC: 4.5 MIL/uL (ref 4.22–5.81)
RDW: 12.9 % (ref 11.5–15.5)
WBC: 13.5 10*3/uL — ABNORMAL HIGH (ref 4.0–10.5)

## 2015-08-22 LAB — URINALYSIS, ROUTINE W REFLEX MICROSCOPIC
Bilirubin Urine: NEGATIVE
Glucose, UA: 250 mg/dL — AB
Ketones, ur: 15 mg/dL — AB
Nitrite: NEGATIVE
Protein, ur: 100 mg/dL — AB
Specific Gravity, Urine: 1.016 (ref 1.005–1.030)
Urobilinogen, UA: 1 mg/dL (ref 0.0–1.0)
pH: 6.5 (ref 5.0–8.0)

## 2015-08-22 LAB — COMPREHENSIVE METABOLIC PANEL
ALBUMIN: 4.2 g/dL (ref 3.5–5.0)
ALK PHOS: 81 U/L (ref 38–126)
ALT: 27 U/L (ref 17–63)
ANION GAP: 8 (ref 5–15)
AST: 26 U/L (ref 15–41)
BILIRUBIN TOTAL: 0.6 mg/dL (ref 0.3–1.2)
BUN: 11 mg/dL (ref 6–20)
CALCIUM: 8.9 mg/dL (ref 8.9–10.3)
CO2: 27 mmol/L (ref 22–32)
Chloride: 101 mmol/L (ref 101–111)
Creatinine, Ser: 0.83 mg/dL (ref 0.61–1.24)
GFR calc non Af Amer: >60 mL/min (ref >60)
Glucose, Bld: 137 mg/dL — ABNORMAL HIGH (ref 65–99)
POTASSIUM: 3.2 mmol/L — AB (ref 3.5–5.1)
SODIUM: 136 mmol/L (ref 135–145)
TOTAL PROTEIN: 6.9 g/dL (ref 6.5–8.1)

## 2015-08-22 LAB — URINE MICROSCOPIC-ADD ON

## 2015-08-22 MED ORDER — BELLADONNA ALKALOIDS-OPIUM 16.2-60 MG RE SUPP
1.0000 | Freq: Once | RECTAL | Status: AC
  Administered 2015-08-22: 1 via RECTAL
  Filled 2015-08-22: qty 1

## 2015-08-22 MED ORDER — IOHEXOL 300 MG/ML  SOLN
50.0000 mL | Freq: Once | INTRAMUSCULAR | Status: DC | PRN
  Administered 2015-08-22: 50 mL via ORAL
  Filled 2015-08-22: qty 50

## 2015-08-22 MED ORDER — KETOROLAC TROMETHAMINE 30 MG/ML IJ SOLN
30.0000 mg | Freq: Once | INTRAMUSCULAR | Status: AC
  Administered 2015-08-22: 30 mg via INTRAVENOUS
  Filled 2015-08-22: qty 1

## 2015-08-22 MED ORDER — IOHEXOL 300 MG/ML  SOLN
100.0000 mL | Freq: Once | INTRAMUSCULAR | Status: AC | PRN
  Administered 2015-08-22: 100 mL via INTRAVENOUS

## 2015-08-22 MED ORDER — FENTANYL CITRATE (PF) 100 MCG/2ML IJ SOLN
50.0000 ug | Freq: Once | INTRAMUSCULAR | Status: AC
  Administered 2015-08-22: 50 ug via INTRAVENOUS
  Filled 2015-08-22: qty 2

## 2015-08-22 MED ORDER — PROMETHAZINE HCL 25 MG/ML IJ SOLN
12.5000 mg | Freq: Once | INTRAMUSCULAR | Status: AC
  Administered 2015-08-22: 12.5 mg via INTRAVENOUS
  Filled 2015-08-22: qty 1

## 2015-08-22 MED ORDER — FENTANYL CITRATE (PF) 100 MCG/2ML IJ SOLN
50.0000 ug | Freq: Once | INTRAMUSCULAR | Status: AC
Start: 2015-08-22 — End: 2015-08-22
  Administered 2015-08-22: 50 ug via INTRAVENOUS

## 2015-08-22 MED ORDER — FENTANYL CITRATE (PF) 100 MCG/2ML IJ SOLN
INTRAMUSCULAR | Status: AC
  Filled 2015-08-22: qty 2

## 2015-08-22 MED ORDER — SODIUM CHLORIDE 0.9 % IV BOLUS (SEPSIS)
1000.0000 mL | Freq: Once | INTRAVENOUS | Status: AC
  Administered 2015-08-22: 1000 mL via INTRAVENOUS

## 2015-08-22 NOTE — ED Notes (Signed)
Patient was released from the hospital recently from bladder surgery. Patient c/o urinary frequency and pain. Patient's wife gave the patient Azo and eased the pain some, but today the pain is worse. Patient is continuing to have blood inhis urine since surgery.

## 2015-08-22 NOTE — ED Notes (Signed)
Bed: WA21 Expected date:  Expected time:  Means of arrival:  Comments: 

## 2015-08-22 NOTE — ED Provider Notes (Signed)
CSN: [id number]     Arrival date & time 08/22/15  0944 History   First MD Initiated Contact with Patient 08/22/15 1035     Chief Complaint  Patient presents with  . painful urination    . Urinary Frequency  . Nausea      HPI Patient was released from the hospital recently from bladder surgery. Patient c/o urinary frequency and pain. Patient's wife gave the patient Azo and eased the pain some, but today the pain is worse. Patient is continuing to have blood inhis urine since surgery. Past Medical History  Diagnosis Date  . Hyperlipidemia   . Vitamin B12 deficiency   . Occasional tremors     leg tremors  . History of CVA (cerebrovascular accident) without residual deficits     2011  . Other degenerative diseases of the basal ganglia     S/P CVA 2011--  CHRONIC LEFT BASAL GANGLIA LACUNAR INFARCT  PER CT  . History of nonmelanoma skin cancer     EXCISION OF NOSE  . Hypertension   . Schizophrenia     HX PSYCHIATRIC ADMISSION'S  AND ECT ([place]) [place]  . Bipolar affective disorder   . ED (erectile dysfunction) of organic origin   . Dementia   . Depression   . Generalized weakness     LOWER EXTREMITIES  . Chronic anxiety   . Urinary bladder stone   . History of bladder stone   . BPH (benign prostatic hyperplasia)   . Lower urinary tract symptoms (LUTS)   . Elevated PSA   . History of acute pyelonephritis     admission 07-19-2015  . Chronic constipation    Past Surgical History  Procedure Laterality Date  . Transanal excision anal and rectal polyps  11-02-2000  . Transthoracic echocardiogram  08-16-2014    Mild LVH/ grade I diastolic dysfunction/  ef 60-65%/  mild AV calification without stenosis/  mild RAE  . Cardiovascular stress test  06-18-2013  DR Jenkins [place]    LOW RISK NO EXERCISE LEXISCAN STUDY/  NO ISCHEMIA, CANNOT RULE OUT BASAL INFERIOR INFARCTION BUT POSSIBLE ARTIFACT GIVEN NORMAL WALL MOTION/  EF 73%  . Excision tumor upper left thigh  2012  . Transurethral  incision of prostate N/A 10/03/2013    Procedure: TRANSURETHRAL INCISION OF THE PROSTATE (TUIP);  Surgeon: Claybon Jabs, MD;  Location: [facility];  Service: Urology;  Laterality: N/A;  . Cystoscopy with litholapaxy N/A 10/03/2013    Procedure: CYSTOSCOPY WITH LITHOLAPAXY;  Surgeon: Claybon Jabs, MD;  Location: [facility];  Service: Urology;  Laterality: N/A;  . Holmium laser application N/A 44/02/4741    Procedure: HOLMIUM LASER APPLICATION;  Surgeon: Claybon Jabs, MD;  Location: [facility];  Service: Urology;  Laterality: N/A;  . Cystoscopy with litholapaxy N/A 08/13/2015    Procedure: CYSTOSCOPY WITH LITHOLAPAXY;  Surgeon: Kathie Rhodes, MD;  Location: [facility];  Service: Urology;  Laterality: N/A;  . Transurethral resection of prostate N/A 08/13/2015    Procedure: TRANSURETHRAL RESECTION OF THE PROSTATE (TURP);  Surgeon: Kathie Rhodes, MD;  Location: [facility];  Service: Urology;  Laterality: N/A;  . Holmium laser application N/A 04/11/5637    Procedure: HOLMIUM LASER APPLICATION;  Surgeon: Kathie Rhodes, MD;  Location: [facility];  Service: Urology;  Laterality: N/A;   Family History  Problem Relation Age of Onset  . Dementia Father   . Dementia Mother   . Colon cancer  Neg Hx   . Colon polyps Neg Hx   . Rectal cancer Neg Hx   . Stomach cancer Neg Hx    Social History  Substance Use Topics  . Smoking status: Current Every Day Smoker -- 0.10 packs/day for 48 years    Types: Cigarettes  . Smokeless tobacco: Never Used  . Alcohol Use: No    Review of Systems    Allergies  Review of patient's allergies indicates no known allergies.  Home Medications   Prior to Admission medications   Medication Sig Start Date End Date Taking? Authorizing Provider  ALPRAZolam Duanne Moron) 0.5 MG tablet TAKE 1 TABLET BY MOUTH THREE TIMES DAILY AS NEEDED FOR ANXIETY 06/02/15  Yes Biagio Borg, MD    amLODipine (NORVASC) 5 MG tablet Take 1 tablet by mouth daily. 08/03/15  Yes Historical Provider, MD  Bisacodyl (DULCOLAX PO) Take 3 tablets by mouth at bedtime as needed (const).    Yes Historical Provider, MD  carbidopa-levodopa (SINEMET IR) 25-100 MG per tablet Take 1 tablet by mouth 3 (three) times daily. 06/09/15  Yes Biagio Borg, MD  Flaxseed, Linseed, (FLAXSEED OIL PO) Take 2 capsules by mouth daily.   Yes Historical Provider, MD  HYDROcodone-acetaminophen (NORCO) 10-325 MG per tablet Take 1-2 tablets by mouth every 4 (four) hours as needed for moderate pain. Maximum dose per 24 hours -8 pills. 08/13/15  Yes Kathie Rhodes, MD  ibuprofen (ADVIL,MOTRIN) 200 MG tablet Take 200-400 mg by mouth every 6 (six) hours as needed for headache or moderate pain.    Yes Historical Provider, MD  mirtazapine (REMERON) 15 MG tablet Take 1 tablet (15 mg total) by mouth at bedtime. 06/09/15  Yes Biagio Borg, MD  nitrofurantoin, macrocrystal-monohydrate, (MACROBID) 100 MG capsule Take 1 capsule by mouth at bedtime. Total 10, has 7 left 08/06/15  Yes Historical Provider, MD  omega-3 acid ethyl esters (LOVAZA) 1 G capsule Take 3 g by mouth daily.   Yes Historical Provider, MD  ondansetron (ZOFRAN) 4 MG tablet Take 1 tablet (4 mg total) by mouth every 6 (six) hours as needed for nausea. 07/22/15  Yes Nita Sells, MD  oxybutynin (DITROPAN) 5 MG tablet Take 1 tablet by mouth every 8 (eight) hours as needed. Bladder spasms 07/25/15  Yes Historical Provider, MD  pantoprazole (PROTONIX) 40 MG tablet Take 1 tablet (40 mg total) by mouth daily. Patient taking differently: Take 40 mg by mouth every morning.  06/09/15  Yes Biagio Borg, MD  Pumpkin Seed-Soy Germ (AZO BLADDER CONTROL/GO-LESS) CAPS Take 1 capsule by mouth 3 (three) times daily.   Yes Historical Provider, MD  vitamin B-12 1000 MCG tablet Take 1 tablet (1,000 mcg total) by mouth daily. 08/17/14  Yes Janece Canterbury, MD  amLODipine (NORVASC) 10 MG tablet Take 1  tablet (10 mg total) by mouth daily. Patient not taking: Reported on 08/13/2015 11/25/14   Reyne Dumas, MD   BP 134/78 mmHg  Pulse 92  Temp(Src) 98.1 F (36.7 C) (Oral)  Resp 16  SpO2 95% Physical Exam  Constitutional: He is oriented to person, place, and time. He appears well-developed and well-nourished. No distress.  HENT:  Head: Normocephalic and atraumatic.  Eyes: Pupils are equal, round, and reactive to light.  Neck: Normal range of motion.  Cardiovascular: Normal rate and intact distal pulses.   Pulmonary/Chest: No respiratory distress.  Abdominal: Normal appearance. He exhibits no distension. There is tenderness (Suprapubic area).    Genitourinary: Penis normal. No penile tenderness.  Musculoskeletal:  Normal range of motion.  Neurological: He is alert and oriented to person, place, and time. No cranial nerve deficit.  Skin: Skin is warm and dry. No rash noted.  Psychiatric: He has a normal mood and affect. His behavior is normal.  Nursing note and vitals reviewed.   ED Course  Procedures (including critical care time) Medications  sodium chloride 0.9 % bolus 1,000 mL (0 mLs Intravenous Stopped 08/22/15 1329)  promethazine (PHENERGAN) injection 12.5 mg (12.5 mg Intravenous Given 08/22/15 1210)  fentaNYL (SUBLIMAZE) injection 50 mcg (50 mcg Intravenous Given 08/22/15 1210)  fentaNYL (SUBLIMAZE) injection 50 mcg (50 mcg Intravenous Given 08/22/15 1329)  promethazine (PHENERGAN) injection 12.5 mg (12.5 mg Intravenous Given 08/22/15 1509)  ketorolac (TORADOL) 30 MG/ML injection 30 mg (30 mg Intravenous Given 08/22/15 1509)  opium-belladonna (B&O SUPPRETTES) 16.2-60 MG suppository 1 suppository (1 suppository Rectal Given 08/22/15 1519)  iohexol (OMNIPAQUE) 300 MG/ML solution 100 mL (100 mLs Intravenous Contrast Given 08/22/15 1446)    Labs Review Labs Reviewed  URINALYSIS, ROUTINE W REFLEX MICROSCOPIC (NOT AT [facility]) - Abnormal; Notable for the following:    Color, Urine RED (*)     APPearance TURBID (*)    Glucose, UA 250 (*)    Hgb urine dipstick LARGE (*)    Ketones, ur 15 (*)    Protein, ur 100 (*)    Leukocytes, UA MODERATE (*)    All other components within normal limits  CBC WITH DIFFERENTIAL/PLATELET - Abnormal; Notable for the following:    WBC 13.5 (*)    Neutro Abs 12.2 (*)    All other components within normal limits  COMPREHENSIVE METABOLIC PANEL - Abnormal; Notable for the following:    Potassium 3.2 (*)    Glucose, Bld 137 (*)    All other components within normal limits  LIPASE, BLOOD - Abnormal; Notable for the following:    Lipase 14 (*)    All other components within normal limits  URINE MICROSCOPIC-ADD ON    Imaging Review IMPRESSION: Diffuse thickening of the gallbladder wall with infectious inflammatory appearance.  Normal appearance of the kidneys and ureters.  No evidence of abnormalities within the solid abdominal organs.  Incidental note of aortic valve annulus calcifications.   I discussed the case with general surgery. Rosenbauer, who reviewed the CT scan and felt the gallbladder did not appear inflamed and appeared to be normal.  Patient was seen and evaluated by urology.  MDM   Final diagnoses:  Urinary retention        Leonard Schwartz, MD 08/26/15 [phone]

## 2015-08-22 NOTE — ED Provider Notes (Signed)
Urology has called back and states pt can be d/c home 5:49 PM   Noemi Chapel, MD 08/22/15 [phone]

## 2015-08-22 NOTE — ED Notes (Addendum)
RN spoke to Urologist who states that Pt Foley should be unclamped a this time.  Pt Foley unclamped when RN walked into room.  Pt moves a lot while in bed and probably loosened the clamp at some point.  Urologist updated on amount of urine in Foley bag.

## 2015-08-22 NOTE — ED Notes (Signed)
MD at bedside. 

## 2015-08-22 NOTE — ED Notes (Signed)
Patient transported to CT 

## 2015-08-22 NOTE — ED Notes (Signed)
Pt sleeping.  O2 at 93% on RA

## 2015-08-22 NOTE — ED Notes (Signed)
Pt and wife given Sprite to drink with ED Provider approval.

## 2015-08-22 NOTE — Consult Note (Signed)
Urology Consult   Physician requesting consult: Leonard Schwartz, MD  Reason for consult: Urinary retention  History of Present Illness: [name] is a 69 y.o. male with parkinson's disease and BPH s/p TURP and bladder stone removal on 08/13/15 with Dr. Karsten Ro. The patient had increasing suprapubic discomfort and came into the ED where he was bladder scanned for >900 cc's. A foley was placed and he had an immediate return of about 1 liter and then proceeded to produce about 2 more liters of urine. The patient complained of some persistent suprapubic discomfort. The patient denies any fevers at home, had intermittent gross hematuria after his TURP.   Past Medical History  Diagnosis Date  . Hyperlipidemia   . Vitamin B12 deficiency   . Occasional tremors     leg tremors  . History of CVA (cerebrovascular accident) without residual deficits     2011  . Other degenerative diseases of the basal ganglia     S/P CVA 2011--  CHRONIC LEFT BASAL GANGLIA LACUNAR INFARCT  PER CT  . History of nonmelanoma skin cancer     EXCISION OF NOSE  . Hypertension   . Schizophrenia     HX PSYCHIATRIC ADMISSION'S  AND ECT ([place]) [place]  . Bipolar affective disorder   . ED (erectile dysfunction) of organic origin   . Dementia   . Depression   . Generalized weakness     LOWER EXTREMITIES  . Chronic anxiety   . Urinary bladder stone   . History of bladder stone   . BPH (benign prostatic hyperplasia)   . Lower urinary tract symptoms (LUTS)   . Elevated PSA   . History of acute pyelonephritis     admission 07-19-2015  . Chronic constipation     Past Surgical History  Procedure Laterality Date  . Transanal excision anal and rectal polyps  11-02-2000  . Transthoracic echocardiogram  08-16-2014    Mild LVH/ grade I diastolic dysfunction/  ef 60-65%/  mild AV calification without stenosis/  mild RAE  . Cardiovascular stress test  06-18-2013  DR Jenkins Rouge    LOW RISK NO EXERCISE LEXISCAN STUDY/  NO  ISCHEMIA, CANNOT RULE OUT BASAL INFERIOR INFARCTION BUT POSSIBLE ARTIFACT GIVEN NORMAL WALL MOTION/  EF 73%  . Excision tumor upper left thigh  2012  . Transurethral incision of prostate N/A 10/03/2013    Procedure: TRANSURETHRAL INCISION OF THE PROSTATE (TUIP);  Surgeon: Claybon Jabs, MD;  Location: [facility];  Service: Urology;  Laterality: N/A;  . Cystoscopy with litholapaxy N/A 10/03/2013    Procedure: CYSTOSCOPY WITH LITHOLAPAXY;  Surgeon: Claybon Jabs, MD;  Location: [facility];  Service: Urology;  Laterality: N/A;  . Holmium laser application N/A 74/94/4967    Procedure: HOLMIUM LASER APPLICATION;  Surgeon: Claybon Jabs, MD;  Location: [facility];  Service: Urology;  Laterality: N/A;  . Cystoscopy with litholapaxy N/A 08/13/2015    Procedure: CYSTOSCOPY WITH LITHOLAPAXY;  Surgeon: Kathie Rhodes, MD;  Location: [facility];  Service: Urology;  Laterality: N/A;  . Transurethral resection of prostate N/A 08/13/2015    Procedure: TRANSURETHRAL RESECTION OF THE PROSTATE (TURP);  Surgeon: Kathie Rhodes, MD;  Location: [facility];  Service: Urology;  Laterality: N/A;  . Holmium laser application N/A 04/11/1637    Procedure: HOLMIUM LASER APPLICATION;  Surgeon: Kathie Rhodes, MD;  Location: [facility];  Service: Urology;  Laterality: N/A;    Current Hospital Medications:  Home Meds:    Medication List    ASK your doctor about these medications        ALPRAZolam 0.5 MG tablet  Commonly known as:  XANAX  TAKE 1 TABLET BY MOUTH THREE TIMES DAILY AS NEEDED FOR ANXIETY     amLODipine 10 MG tablet  Commonly known as:  NORVASC  Take 1 tablet (10 mg total) by mouth daily.     amLODipine 5 MG tablet  Commonly known as:  NORVASC  Take 1 tablet by mouth daily.     AZO BLADDER CONTROL/GO-LESS Caps  Take 1 capsule by mouth 3 (three) times daily.     carbidopa-levodopa 25-100 MG per tablet  Commonly  known as:  SINEMET IR  Take 1 tablet by mouth 3 (three) times daily.     cyanocobalamin 1000 MCG tablet  Take 1 tablet (1,000 mcg total) by mouth daily.     DULCOLAX PO  Take 3 tablets by mouth at bedtime as needed (const).     FLAXSEED OIL PO  Take 2 capsules by mouth daily.     HYDROcodone-acetaminophen 10-325 MG per tablet  Commonly known as:  NORCO  Take 1-2 tablets by mouth every 4 (four) hours as needed for moderate pain. Maximum dose per 24 hours -8 pills.     ibuprofen 200 MG tablet  Commonly known as:  ADVIL,MOTRIN  Take 200-400 mg by mouth every 6 (six) hours as needed for headache or moderate pain.     mirtazapine 15 MG tablet  Commonly known as:  REMERON  Take 1 tablet (15 mg total) by mouth at bedtime.     nitrofurantoin (macrocrystal-monohydrate) 100 MG capsule  Commonly known as:  MACROBID  Take 1 capsule by mouth at bedtime. Total 10, has 7 left     omega-3 acid ethyl esters 1 G capsule  Commonly known as:  LOVAZA  Take 3 g by mouth daily.     ondansetron 4 MG tablet  Commonly known as:  ZOFRAN  Take 1 tablet (4 mg total) by mouth every 6 (six) hours as needed for nausea.     oxybutynin 5 MG tablet  Commonly known as:  DITROPAN  Take 1 tablet by mouth every 8 (eight) hours as needed. Bladder spasms     pantoprazole 40 MG tablet  Commonly known as:  PROTONIX  Take 1 tablet (40 mg total) by mouth daily.        Scheduled Meds: . ketorolac  30 mg Intravenous Once  . opium-belladonna  1 suppository Rectal Once  . promethazine  12.5 mg Intravenous Once   Continuous Infusions:  PRN Meds:.iohexol  Allergies: No Known Allergies  Family History  Problem Relation Age of Onset  . Dementia Father   . Dementia Mother   . Colon cancer Neg Hx   . Colon polyps Neg Hx   . Rectal cancer Neg Hx   . Stomach cancer Neg Hx     Social History:  reports that he has been smoking Cigarettes.  He has a 4.8 pack-year smoking history. He has never used smokeless  tobacco. He reports that he does not drink alcohol or use illicit drugs.  ROS: A complete review of systems was performed.  All systems are negative except for pertinent findings as noted.  Physical Exam:  Vital signs in last 24 hours: Temp:  [98.5 F (36.9 C)] 98.5 F (36.9 C) (09/18 0948) Pulse Rate:  [95-101] 95 (09/18 1330) Resp:  [12-20] 15 (09/18 1330) BP: (  153-171)/(82-91) 153/82 mmHg (09/18 1330) SpO2:  [95 %-96 %] 95 % (09/18 1330) Constitutional:  Alert and oriented, No acute distress Cardiovascular: Regular rate and rhythm, No JVD Respiratory: Normal respiratory effort, Lungs clear bilaterally GI: Abdomen is soft, nontender, nondistended, no abdominal masses GU: No CVA tenderness, foley in place with clear urine Lymphatic: No lymphadenopathy Neurologic: Grossly intact, no focal deficits Psychiatric: Normal mood and affect  Laboratory Data:   Recent Labs  08/22/15 1209  WBC 13.5*  HGB 14.3  HCT 39.9  PLT 207     Recent Labs  08/22/15 1209  NA 136  K 3.2*  CL 101  GLUCOSE 137*  BUN 11  CALCIUM 8.9  CREATININE 0.83     Results for orders placed or performed during the hospital encounter of 08/22/15 (from the past 24 hour(s))  Urinalysis, Routine w reflex microscopic-may I&O cath if menses (not at [facility])     Status: Abnormal   Collection Time: 08/22/15 10:00 AM  Result Value Ref Range   Color, Urine RED (A) YELLOW   APPearance TURBID (A) CLEAR   Specific Gravity, Urine 1.016 1.005 - 1.030   pH 6.5 5.0 - 8.0   Glucose, UA 250 (A) NEGATIVE mg/dL   Hgb urine dipstick LARGE (A) NEGATIVE   Bilirubin Urine NEGATIVE NEGATIVE   Ketones, ur 15 (A) NEGATIVE mg/dL   Protein, ur 100 (A) NEGATIVE mg/dL   Urobilinogen, UA 1.0 0.0 - 1.0 mg/dL   Nitrite NEGATIVE NEGATIVE   Leukocytes, UA MODERATE (A) NEGATIVE  Urine microscopic-add on     Status: None   Collection Time: 08/22/15 10:00 AM  Result Value Ref Range   WBC, UA 3-6 <3 WBC/hpf   RBC / HPF TOO  NUMEROUS TO COUNT <3 RBC/hpf   Urine-Other FIELD OBSCURED BY RBC'S   CBC with Differential/Platelet     Status: Abnormal   Collection Time: 08/22/15 12:09 PM  Result Value Ref Range   WBC 13.5 (H) 4.0 - 10.5 K/uL   RBC 4.50 4.22 - 5.81 MIL/uL   Hemoglobin 14.3 13.0 - 17.0 g/dL   HCT 39.9 39.0 - 52.0 %   MCV 88.7 78.0 - 100.0 fL   MCH 31.8 26.0 - 34.0 pg   MCHC 35.8 30.0 - 36.0 g/dL   RDW 12.9 11.5 - 15.5 %   Platelets 207 150 - 400 K/uL   Neutrophils Relative % 90 %   Neutro Abs 12.2 (H) 1.7 - 7.7 K/uL   Lymphocytes Relative 7 %   Lymphs Abs 0.9 0.7 - 4.0 K/uL   Monocytes Relative 3 %   Monocytes Absolute 0.4 0.1 - 1.0 K/uL   Eosinophils Relative 0 %   Eosinophils Absolute 0.0 0.0 - 0.7 K/uL   Basophils Relative 0 %   Basophils Absolute 0.0 0.0 - 0.1 K/uL  Comprehensive metabolic panel     Status: Abnormal   Collection Time: 08/22/15 12:09 PM  Result Value Ref Range   Sodium 136 135 - 145 mmol/L   Potassium 3.2 (L) 3.5 - 5.1 mmol/L   Chloride 101 101 - 111 mmol/L   CO2 27 22 - 32 mmol/L   Glucose, Bld 137 (H) 65 - 99 mg/dL   BUN 11 6 - 20 mg/dL   Creatinine, Ser 0.83 0.61 - 1.24 mg/dL   Calcium 8.9 8.9 - 10.3 mg/dL   Total Protein 6.9 6.5 - 8.1 g/dL   Albumin 4.2 3.5 - 5.0 g/dL   AST 26 15 - 41 U/L   ALT  27 17 - 63 U/L   Alkaline Phosphatase 81 38 - 126 U/L   Total Bilirubin 0.6 0.3 - 1.2 mg/dL   GFR calc non Af Amer >60 >60 mL/min   GFR calc Af Amer >60 >60 mL/min   Anion gap 8 5 - 15  Lipase, blood     Status: Abnormal   Collection Time: 08/22/15 12:09 PM  Result Value Ref Range   Lipase 14 (L) 22 - 51 U/L   No results found for this or any previous visit (from the past 240 hour(s)).  Renal Function:  Recent Labs  08/22/15 1209  CREATININE 0.83   Estimated Creatinine Clearance: 93.6 mL/min (by C-G formula based on Cr of 0.83).  Radiologic Imaging: No results found.  I independently reviewed the above imaging studies.  Impression/Recommendation 69  y.o. male s/p TURP and litholapaxy on 08/13/15 with Dr. Karsten Ro  - Toradol and B&O suppositories for pain in ED - Send home with pain medications - leave foley in place - the patient should drink to thirst to prevent dehydration - follow up Friday with Dr. Harlon Ditty Vonna Kotyk 08/22/2015, 2:52 PM

## 2015-08-22 NOTE — ED Notes (Signed)
Urologist at bedside.

## 2015-08-22 NOTE — Discharge Instructions (Signed)
Acute Urinary Retention °Acute urinary retention is the temporary inability to urinate. °This is a common problem in older men. As men age their prostates become larger and block the flow of urine from the bladder. This is usually a problem that has come on gradually.  °HOME CARE INSTRUCTIONS °If you are sent home with a Foley catheter and a drainage system, you will need to discuss the best course of action with your health care provider. While the catheter is in, maintain a good intake of fluids. Keep the drainage bag emptied and lower than your catheter. This is so that contaminated urine will not flow back into your bladder, which could lead to a urinary tract infection. °There are two main types of drainage bags. One is a large bag that usually is used at night. It has a good capacity that will allow you to sleep through the night without having to empty it. The second type is called a leg bag. It has a smaller capacity, so it needs to be emptied more frequently. However, the main advantage is that it can be attached by a leg strap and can go underneath your clothing, allowing you the freedom to move about or leave your home. °Only take over-the-counter or prescription medicines for pain, discomfort, or fever as directed by your health care provider.  °SEEK MEDICAL CARE IF: °· You develop a low-grade fever. °· You experience spasms or leakage of urine with the spasms. °SEEK IMMEDIATE MEDICAL CARE IF:  °· You develop chills or fever. °· Your catheter stops draining urine. °· Your catheter falls out. °· You start to develop increased bleeding that does not respond to rest and increased fluid intake. °MAKE SURE YOU: °· Understand these instructions. °· Will watch your condition. °· Will get help right away if you are not doing well or get worse. °Document Released: 02/26/2001 Document Revised: 11/25/2013 Document Reviewed: 05/01/2013 °Exit[employer]® Patient Information ©2015 [employer]. This information is not  intended to replace advice given to you by your health care provider. Make sure you discuss any questions you have with your health care provider. ° °

## 2015-08-29 ENCOUNTER — Encounter (HOSPITAL_COMMUNITY): Payer: Self-pay | Admitting: Emergency Medicine

## 2015-08-29 ENCOUNTER — Emergency Department (HOSPITAL_COMMUNITY)
Admission: EM | Admit: 2015-08-29 | Discharge: 2015-08-29 | Disposition: A | Discharge status: Home / Self Care | Payer: Medicare Other | Source: Home / Self Care | Attending: Emergency Medicine | Admitting: Emergency Medicine

## 2015-08-29 ENCOUNTER — Emergency Department (HOSPITAL_COMMUNITY)
Admission: EM | Admit: 2015-08-29 | Discharge: 2015-08-29 | Disposition: A | Discharge status: Home / Self Care | Payer: Medicare Other | Attending: Emergency Medicine | Admitting: Emergency Medicine

## 2015-08-29 DIAGNOSIS — F329 Major depressive disorder, single episode, unspecified: Secondary | ICD-10-CM

## 2015-08-29 DIAGNOSIS — Z87448 Personal history of other diseases of urinary system: Secondary | ICD-10-CM | POA: Insufficient documentation

## 2015-08-29 DIAGNOSIS — Z8673 Personal history of transient ischemic attack (TIA), and cerebral infarction without residual deficits: Secondary | ICD-10-CM | POA: Insufficient documentation

## 2015-08-29 DIAGNOSIS — I10 Essential (primary) hypertension: Secondary | ICD-10-CM | POA: Insufficient documentation

## 2015-08-29 DIAGNOSIS — R339 Retention of urine, unspecified: Secondary | ICD-10-CM | POA: Insufficient documentation

## 2015-08-29 DIAGNOSIS — Z87440 Personal history of urinary (tract) infections: Secondary | ICD-10-CM | POA: Diagnosis not present

## 2015-08-29 DIAGNOSIS — F0390 Unspecified dementia without behavioral disturbance: Secondary | ICD-10-CM | POA: Insufficient documentation

## 2015-08-29 DIAGNOSIS — Z8719 Personal history of other diseases of the digestive system: Secondary | ICD-10-CM | POA: Diagnosis not present

## 2015-08-29 DIAGNOSIS — Z8669 Personal history of other diseases of the nervous system and sense organs: Secondary | ICD-10-CM | POA: Diagnosis not present

## 2015-08-29 DIAGNOSIS — Z85828 Personal history of other malignant neoplasm of skin: Secondary | ICD-10-CM | POA: Insufficient documentation

## 2015-08-29 DIAGNOSIS — K5900 Constipation, unspecified: Secondary | ICD-10-CM

## 2015-08-29 DIAGNOSIS — Z79899 Other long term (current) drug therapy: Secondary | ICD-10-CM | POA: Insufficient documentation

## 2015-08-29 DIAGNOSIS — T83098A Other mechanical complication of other indwelling urethral catheter, initial encounter: Secondary | ICD-10-CM | POA: Insufficient documentation

## 2015-08-29 DIAGNOSIS — Z720 Tobacco use: Secondary | ICD-10-CM | POA: Insufficient documentation

## 2015-08-29 DIAGNOSIS — R3919 Other difficulties with micturition: Secondary | ICD-10-CM

## 2015-08-29 DIAGNOSIS — Y828 Other medical devices associated with adverse incidents: Secondary | ICD-10-CM | POA: Insufficient documentation

## 2015-08-29 DIAGNOSIS — Z87438 Personal history of other diseases of male genital organs: Secondary | ICD-10-CM | POA: Diagnosis not present

## 2015-08-29 DIAGNOSIS — E785 Hyperlipidemia, unspecified: Secondary | ICD-10-CM | POA: Insufficient documentation

## 2015-08-29 DIAGNOSIS — N400 Enlarged prostate without lower urinary tract symptoms: Secondary | ICD-10-CM

## 2015-08-29 DIAGNOSIS — E538 Deficiency of other specified B group vitamins: Secondary | ICD-10-CM | POA: Insufficient documentation

## 2015-08-29 DIAGNOSIS — F419 Anxiety disorder, unspecified: Secondary | ICD-10-CM

## 2015-08-29 DIAGNOSIS — Z87442 Personal history of urinary calculi: Secondary | ICD-10-CM | POA: Insufficient documentation

## 2015-08-29 DIAGNOSIS — T839XXA Unspecified complication of genitourinary prosthetic device, implant and graft, initial encounter: Secondary | ICD-10-CM

## 2015-08-29 MED ORDER — LIDOCAINE HCL 2 % EX GEL
CUTANEOUS | Status: AC
  Administered 2015-08-29: 10
  Filled 2015-08-29: qty 10

## 2015-08-29 MED ORDER — LORAZEPAM 2 MG/ML IJ SOLN
1.0000 mg | Freq: Once | INTRAMUSCULAR | Status: AC
  Administered 2015-08-29: 1 mg via INTRAMUSCULAR
  Filled 2015-08-29: qty 1

## 2015-08-29 NOTE — ED Provider Notes (Signed)
CSN: [id number]     Arrival date & time 08/29/15  1746 History   First MD Initiated Contact with Patient 08/29/15 1845     Chief Complaint  Patient presents with  . Urinary Retention     (Consider location/radiation/quality/duration/timing/severity/associated sxs/prior Treatment) HPI Comments: Patient presents to the emergency department with chief complaint of urinary retention. He states that he was seen this morning and had his Foley catheter removed. He states that he has been using a fully catheter for the past couple of weeks after having a cystoscopy. He states that he began to have leaking around the Foley catheter this morning and came to the ER to have it removed. Patient states that since having the catheter removed he has been unable to urinate. He called his urologist, Dr. Karsten Ro, Who recommended that the patient return to the emergency department if he was unable to urinate by 6 PM. Patient had been unable to do so so he returned. Upon arrival, patient was able to urinate approximately 100 mL, but has a post void residual of greater than 300. He denies any other symptoms. Denies any fevers or chills. Denies any abdominal pain. He has follow-up with urology tomorrow morning.  The history is provided by the patient. No language interpreter was used.    Past Medical History  Diagnosis Date  . Hyperlipidemia   . Vitamin B12 deficiency   . Occasional tremors     leg tremors  . History of CVA (cerebrovascular accident) without residual deficits     2011  . Other degenerative diseases of the basal ganglia     S/P CVA 2011--  CHRONIC LEFT BASAL GANGLIA LACUNAR INFARCT  PER CT  . History of nonmelanoma skin cancer     EXCISION OF NOSE  . Hypertension   . Schizophrenia     HX PSYCHIATRIC ADMISSION'S  AND ECT ([place]) [place]  . Bipolar affective disorder   . ED (erectile dysfunction) of organic origin   . Dementia   . Depression   . Generalized weakness     LOWER EXTREMITIES  .  Chronic anxiety   . Urinary bladder stone   . History of bladder stone   . BPH (benign prostatic hyperplasia)   . Lower urinary tract symptoms (LUTS)   . Elevated PSA   . History of acute pyelonephritis     admission 07-19-2015  . Chronic constipation    Past Surgical History  Procedure Laterality Date  . Transanal excision anal and rectal polyps  11-02-2000  . Transthoracic echocardiogram  08-16-2014    Mild LVH/ grade I diastolic dysfunction/  ef 60-65%/  mild AV calification without stenosis/  mild RAE  . Cardiovascular stress test  06-18-2013  DR Jenkins Rouge    LOW RISK NO EXERCISE LEXISCAN STUDY/  NO ISCHEMIA, CANNOT RULE OUT BASAL INFERIOR INFARCTION BUT POSSIBLE ARTIFACT GIVEN NORMAL WALL MOTION/  EF 73%  . Excision tumor upper left thigh  2012  . Transurethral incision of prostate N/A 10/03/2013    Procedure: TRANSURETHRAL INCISION OF THE PROSTATE (TUIP);  Surgeon: Claybon Jabs, MD;  Location: [facility];  Service: Urology;  Laterality: N/A;  . Cystoscopy with litholapaxy N/A 10/03/2013    Procedure: CYSTOSCOPY WITH LITHOLAPAXY;  Surgeon: Claybon Jabs, MD;  Location: [facility];  Service: Urology;  Laterality: N/A;  . Holmium laser application N/A 40/98/1191    Procedure: HOLMIUM LASER APPLICATION;  Surgeon: Claybon Jabs, MD;  Location: [facility];  Service: Urology;  Laterality: N/A;  . Cystoscopy with litholapaxy N/A 08/13/2015    Procedure: CYSTOSCOPY WITH LITHOLAPAXY;  Surgeon: Kathie Rhodes, MD;  Location: [facility];  Service: Urology;  Laterality: N/A;  . Transurethral resection of prostate N/A 08/13/2015    Procedure: TRANSURETHRAL RESECTION OF THE PROSTATE (TURP);  Surgeon: Kathie Rhodes, MD;  Location: [facility];  Service: Urology;  Laterality: N/A;  . Holmium laser application N/A 01/08/3663    Procedure: HOLMIUM LASER APPLICATION;  Surgeon: Kathie Rhodes, MD;  Location: [facility];  Service: Urology;  Laterality: N/A;   Family History  Problem Relation Age of Onset  . Dementia Father   . Dementia Mother   . Colon cancer Neg Hx   . Colon polyps Neg Hx   . Rectal cancer Neg Hx   . Stomach cancer Neg Hx    Social History  Substance Use Topics  . Smoking status: Current Every Day Smoker -- 0.10 packs/day for 48 years    Types: Cigarettes  . Smokeless tobacco: Never Used  . Alcohol Use: No    Review of Systems  Constitutional: Negative for fever and chills.  Respiratory: Negative for shortness of breath.   Cardiovascular: Negative for chest pain.  Gastrointestinal: Negative for nausea, vomiting, diarrhea and constipation.  Genitourinary: Positive for difficulty urinating. Negative for dysuria.  All other systems reviewed and are negative.     Allergies  Review of patient's allergies indicates no known allergies.  Home Medications   Prior to Admission medications   Medication Sig Start Date End Date Taking? Authorizing Provider  ALPRAZolam Duanne Moron) 0.5 MG tablet TAKE 1 TABLET BY MOUTH THREE TIMES DAILY AS NEEDED FOR ANXIETY 06/02/15   Biagio Borg, MD  amLODipine (NORVASC) 10 MG tablet Take 1 tablet (10 mg total) by mouth daily. Patient not taking: Reported on 08/13/2015 11/25/14   Reyne Dumas, MD  Bisacodyl (DULCOLAX PO) Take 3 tablets by mouth at bedtime as needed (const).     Historical Provider, MD  carbidopa-levodopa (SINEMET IR) 25-100 MG per tablet Take 1 tablet by mouth 3 (three) times daily. 06/09/15   Biagio Borg, MD  Flaxseed, Linseed, (FLAXSEED OIL PO) Take 2 capsules by mouth daily.    Historical Provider, MD  HYDROcodone-acetaminophen (NORCO) 10-325 MG per tablet Take 1-2 tablets by mouth every 4 (four) hours as needed for moderate pain. Maximum dose per 24 hours -8 pills. 08/13/15   Kathie Rhodes, MD  ibuprofen (ADVIL,MOTRIN) 200 MG tablet Take 200-400 mg by mouth every 6 (six) hours as needed for headache or moderate pain.     Historical  Provider, MD  mirtazapine (REMERON) 15 MG tablet Take 1 tablet (15 mg total) by mouth at bedtime. 06/09/15   Biagio Borg, MD  omega-3 acid ethyl esters (LOVAZA) 1 G capsule Take 3 g by mouth daily.    Historical Provider, MD  ondansetron (ZOFRAN) 4 MG tablet Take 1 tablet (4 mg total) by mouth every 6 (six) hours as needed for nausea. 07/22/15   Nita Sells, MD  oxybutynin (DITROPAN) 5 MG tablet Take 1 tablet by mouth every 8 (eight) hours as needed. Bladder spasms 07/25/15   Historical Provider, MD  pantoprazole (PROTONIX) 40 MG tablet Take 1 tablet (40 mg total) by mouth daily. Patient taking differently: Take 40 mg by mouth every morning.  06/09/15   Biagio Borg, MD  vitamin B-12 1000 MCG tablet Take 1 tablet (1,000 mcg total) by mouth daily.  08/17/14   Janece Canterbury, MD   BP 148/83 mmHg  Pulse 80  Temp(Src) 97.8 F (36.6 C) (Oral)  Resp 16  SpO2 95% Physical Exam  Constitutional: He is oriented to person, place, and time. He appears well-developed and well-nourished.  HENT:  Head: Normocephalic and atraumatic.  Eyes: Conjunctivae and EOM are normal. Pupils are equal, round, and reactive to light. Right eye exhibits no discharge. Left eye exhibits no discharge. No scleral icterus.  Neck: Normal range of motion. Neck supple. No JVD present.  Cardiovascular: Normal rate, regular rhythm and normal heart sounds.  Exam reveals no gallop and no friction rub.   No murmur heard. Pulmonary/Chest: Effort normal and breath sounds normal. No respiratory distress. He has no wheezes. He has no rales. He exhibits no tenderness.  Abdominal: Soft. He exhibits no distension and no mass. There is no tenderness. There is no rebound and no guarding.  No focal abdominal tenderness, no RLQ tenderness or pain at McBurney's point, no RUQ tenderness or Murphy's sign, no left-sided abdominal tenderness, no fluid wave, or signs of peritonitis   Musculoskeletal: Normal range of motion. He exhibits no edema or  tenderness.  Neurological: He is alert and oriented to person, place, and time.  Skin: Skin is warm and dry.  Psychiatric: He has a normal mood and affect. His behavior is normal. Judgment and thought content normal.  Nursing note and vitals reviewed.   ED Course  Procedures (including critical care time)   MDM   Final diagnoses:  Urinary retention    Patient with urinary retention. Had Foley catheter removed this morning. States that he has been unable to urinate since.  Bladder scan reveals greater than 300 mL of urine. Patient is attempting to urinate. Patient able to produce 300 mL of urine, however postvoid residual now shows greater than 500 mL. Will reinsert Foley catheter. Patient instructed to follow-up with urology tomorrow. Patient has an appointment. He is feeling better. He is stable and ready for discharge.    Montine Circle, PA-C 08/29/15 [place], MD 08/31/15 [phone]

## 2015-08-29 NOTE — ED Notes (Signed)
Pt had prostate surgery one week ago, urinary catheter was placed at that time.  Last night patient noticed that urine was leaking out of his penis around the catheter.  Patient states that they have an appointment for 9/26 to have catheter removed, but are requesting catheter be removed today since it is leaking. Patient in no apparent distress at this time.

## 2015-08-29 NOTE — Discharge Instructions (Signed)
Acute Urinary Retention °Acute urinary retention is the temporary inability to urinate. °This is a common problem in older men. As men age their prostates become larger and block the flow of urine from the bladder. This is usually a problem that has come on gradually.  °HOME CARE INSTRUCTIONS °If you are sent home with a Foley catheter and a drainage system, you will need to discuss the best course of action with your health care provider. While the catheter is in, maintain a good intake of fluids. Keep the drainage bag emptied and lower than your catheter. This is so that contaminated urine will not flow back into your bladder, which could lead to a urinary tract infection. °There are two main types of drainage bags. One is a large bag that usually is used at night. It has a good capacity that will allow you to sleep through the night without having to empty it. The second type is called a leg bag. It has a smaller capacity, so it needs to be emptied more frequently. However, the main advantage is that it can be attached by a leg strap and can go underneath your clothing, allowing you the freedom to move about or leave your home. °Only take over-the-counter or prescription medicines for pain, discomfort, or fever as directed by your health care provider.  °SEEK MEDICAL CARE IF: °· You develop a low-grade fever. °· You experience spasms or leakage of urine with the spasms. °SEEK IMMEDIATE MEDICAL CARE IF:  °· You develop chills or fever. °· Your catheter stops draining urine. °· Your catheter falls out. °· You start to develop increased bleeding that does not respond to rest and increased fluid intake. °MAKE SURE YOU: °· Understand these instructions. °· Will watch your condition. °· Will get help right away if you are not doing well or get worse. °Document Released: 02/26/2001 Document Revised: 11/25/2013 Document Reviewed: 05/01/2013 °Exit[employer]® Patient Information ©2015 [employer]. This information is not  intended to replace advice given to you by your health care provider. Make sure you discuss any questions you have with your health care provider. ° °

## 2015-08-29 NOTE — ED Provider Notes (Signed)
CSN: [id number]     Arrival date & time 08/29/15  0831 History   First MD Initiated Contact with Patient 08/29/15 [phone]     Chief Complaint  Patient presents with  . Catheter Removal     post-op urinary catheter      (Consider location/radiation/quality/duration/timing/severity/associated sxs/prior Treatment) HPI  Pt presenting with c/o foley catheter leaking.  He is scheduled to have foley removed by urology tomorrow morning at 8:30am- he is requesting that we remove the catheter today.  There is urine draining into foley bag.  No discomfort or abdominal pain.  No fever/chills.  There are no other associated systemic symptoms, there are no other alleviating or modifying factors.   Past Medical History  Diagnosis Date  . Hyperlipidemia   . Vitamin B12 deficiency   . Occasional tremors     leg tremors  . History of CVA (cerebrovascular accident) without residual deficits     2011  . Other degenerative diseases of the basal ganglia     S/P CVA 2011--  CHRONIC LEFT BASAL GANGLIA LACUNAR INFARCT  PER CT  . History of nonmelanoma skin cancer     EXCISION OF NOSE  . Hypertension   . Schizophrenia     HX PSYCHIATRIC ADMISSION'S  AND ECT ([place]) [place]  . Bipolar affective disorder   . ED (erectile dysfunction) of organic origin   . Dementia   . Depression   . Generalized weakness     LOWER EXTREMITIES  . Chronic anxiety   . Urinary bladder stone   . History of bladder stone   . BPH (benign prostatic hyperplasia)   . Lower urinary tract symptoms (LUTS)   . Elevated PSA   . History of acute pyelonephritis     admission 07-19-2015  . Chronic constipation    Past Surgical History  Procedure Laterality Date  . Transanal excision anal and rectal polyps  11-02-2000  . Transthoracic echocardiogram  08-16-2014    Mild LVH/ grade I diastolic dysfunction/  ef 60-65%/  mild AV calification without stenosis/  mild RAE  . Cardiovascular stress test  06-18-2013  DR Jenkins [place]    LOW RISK NO  EXERCISE LEXISCAN STUDY/  NO ISCHEMIA, CANNOT RULE OUT BASAL INFERIOR INFARCTION BUT POSSIBLE ARTIFACT GIVEN NORMAL WALL MOTION/  EF 73%  . Excision tumor upper left thigh  2012  . Transurethral incision of prostate N/A 10/03/2013    Procedure: TRANSURETHRAL INCISION OF THE PROSTATE (TUIP);  Surgeon: Claybon Jabs, MD;  Location: [facility];  Service: Urology;  Laterality: N/A;  . Cystoscopy with litholapaxy N/A 10/03/2013    Procedure: CYSTOSCOPY WITH LITHOLAPAXY;  Surgeon: Claybon Jabs, MD;  Location: [facility];  Service: Urology;  Laterality: N/A;  . Holmium laser application N/A 94/80/1655    Procedure: HOLMIUM LASER APPLICATION;  Surgeon: Claybon Jabs, MD;  Location: [facility];  Service: Urology;  Laterality: N/A;  . Cystoscopy with litholapaxy N/A 08/13/2015    Procedure: CYSTOSCOPY WITH LITHOLAPAXY;  Surgeon: Kathie Rhodes, MD;  Location: [facility];  Service: Urology;  Laterality: N/A;  . Transurethral resection of prostate N/A 08/13/2015    Procedure: TRANSURETHRAL RESECTION OF THE PROSTATE (TURP);  Surgeon: Kathie Rhodes, MD;  Location: [facility];  Service: Urology;  Laterality: N/A;  . Holmium laser application N/A 02/07/4826    Procedure: HOLMIUM LASER APPLICATION;  Surgeon: Kathie Rhodes, MD;  Location: [facility];  Service: Urology;  Laterality:  N/A;   Family History  Problem Relation Age of Onset  . Dementia Father   . Dementia Mother   . Colon cancer Neg Hx   . Colon polyps Neg Hx   . Rectal cancer Neg Hx   . Stomach cancer Neg Hx    Social History  Substance Use Topics  . Smoking status: Current Every Day Smoker -- 0.10 packs/day for 48 years    Types: Cigarettes  . Smokeless tobacco: Never Used  . Alcohol Use: No    Review of Systems  ROS reviewed and all otherwise negative except for mentioned in HPI    Allergies  Review of patient's allergies indicates no known  allergies.  Home Medications   Prior to Admission medications   Medication Sig Start Date End Date Taking? Authorizing Provider  ALPRAZolam Duanne Moron) 0.5 MG tablet TAKE 1 TABLET BY MOUTH THREE TIMES DAILY AS NEEDED FOR ANXIETY 06/02/15  Yes Biagio Borg, MD  Bisacodyl (DULCOLAX PO) Take 3 tablets by mouth at bedtime as needed (const).    Yes Historical Provider, MD  carbidopa-levodopa (SINEMET IR) 25-100 MG per tablet Take 1 tablet by mouth 3 (three) times daily. 06/09/15  Yes Biagio Borg, MD  Flaxseed, Linseed, (FLAXSEED OIL PO) Take 2 capsules by mouth daily.   Yes Historical Provider, MD  HYDROcodone-acetaminophen (NORCO) 10-325 MG per tablet Take 1-2 tablets by mouth every 4 (four) hours as needed for moderate pain. Maximum dose per 24 hours -8 pills. 08/13/15  Yes Kathie Rhodes, MD  ibuprofen (ADVIL,MOTRIN) 200 MG tablet Take 200-400 mg by mouth every 6 (six) hours as needed for headache or moderate pain.    Yes Historical Provider, MD  mirtazapine (REMERON) 15 MG tablet Take 1 tablet (15 mg total) by mouth at bedtime. 06/09/15  Yes Biagio Borg, MD  omega-3 acid ethyl esters (LOVAZA) 1 G capsule Take 3 g by mouth daily.   Yes Historical Provider, MD  ondansetron (ZOFRAN) 4 MG tablet Take 1 tablet (4 mg total) by mouth every 6 (six) hours as needed for nausea. 07/22/15  Yes Nita Sells, MD  oxybutynin (DITROPAN) 5 MG tablet Take 1 tablet by mouth every 8 (eight) hours as needed. Bladder spasms 07/25/15  Yes Historical Provider, MD  pantoprazole (PROTONIX) 40 MG tablet Take 1 tablet (40 mg total) by mouth daily. Patient taking differently: Take 40 mg by mouth every morning.  06/09/15  Yes Biagio Borg, MD  vitamin B-12 1000 MCG tablet Take 1 tablet (1,000 mcg total) by mouth daily. 08/17/14  Yes Janece Canterbury, MD  amLODipine (NORVASC) 10 MG tablet Take 1 tablet (10 mg total) by mouth daily. Patient not taking: Reported on 08/13/2015 11/25/14   Reyne Dumas, MD   BP 157/83 mmHg  Pulse 76   Temp(Src) 98 F (36.7 C) (Oral)  Resp 17  SpO2 98%  Vitals reviewed Physical Exam  Physical Examination: General appearance - alert, well appearing, and in no distress Mental status - alert, oriented to person, place, and time Eyes - no conjunctival injection, no scleral icterus Chest - clear to auscultation, no wheezes, rales or rhonchi, symmetric air entry Heart - normal rate, regular rhythm, normal S1, S2, no murmurs, rubs, clicks or gallops Abdomen - soft, nontender, nondistended, no masses or organomegaly GU Male - no penile lesions or discharge, foley catheter in place Neurological - alert, oriented, normal speech,  Extremities - peripheral pulses normal, no pedal edema, no clubbing or cyanosis Skin - normal coloration and turgor,  no rashes  ED Course  Procedures (including critical care time) Labs Review Labs Reviewed - No data to display  Imaging Review No results found. I have personally reviewed and evaluated these images and lab results as part of my medical decision-making.   EKG Interpretation None      MDM   Final diagnoses:  Foley catheter problem, initial encounter    Pt presenting due to leaking foley catheter, he is requesting that this be removed- planning for removal tomorrow morning.  Foley was removed without incident.  Discussed risk of urinary retention and will need to f/u with urology as planned tomorrow - return to the ED if any problems sooner.  Discharged with strict return precautions.  Pt agreeable with plan.    Alfonzo Beers, MD 08/29/15 [phone]

## 2015-08-29 NOTE — Discharge Instructions (Signed)
Return to the ED with any concerns including diffficulty passing urine, abdominal pain, fever/chills, vomiting, decreased level of alertness/lethargy, or any other alarming symptoms

## 2015-08-29 NOTE — ED Notes (Signed)
Pt has had urinary catheters since urinary procedure in early September. Pt had a blocked foley this am, so catheter was removed since was scheduled to be removed in the near future. Pt has been unable to urinate since catheter removed at 0930.

## 2015-08-30 ENCOUNTER — Other Ambulatory Visit: Payer: Self-pay | Admitting: Internal Medicine

## 2015-08-31 NOTE — Telephone Encounter (Signed)
Done hardcopy to Dahlia  

## 2015-08-31 NOTE — Telephone Encounter (Signed)
Pt daughter called om

## 2015-08-31 NOTE — Telephone Encounter (Signed)
Rx faxed to pharmacy  

## 2015-09-20 ENCOUNTER — Telehealth: Payer: Self-pay | Admitting: Internal Medicine

## 2015-09-20 NOTE — Telephone Encounter (Signed)
FYI :Has diarrhea since Friday.  This is unusual for him.  He is going three to four times a day.  Patient does have persistent nausea.  Patient is not running a fever.

## 2015-10-06 DIAGNOSIS — B9689 Other specified bacterial agents as the cause of diseases classified elsewhere: Secondary | ICD-10-CM | POA: Diagnosis not present

## 2015-10-06 DIAGNOSIS — F319 Bipolar disorder, unspecified: Secondary | ICD-10-CM | POA: Diagnosis not present

## 2015-10-06 DIAGNOSIS — G8929 Other chronic pain: Secondary | ICD-10-CM | POA: Diagnosis not present

## 2015-10-06 DIAGNOSIS — N10 Acute tubulo-interstitial nephritis: Secondary | ICD-10-CM | POA: Diagnosis not present

## 2015-10-06 DIAGNOSIS — F329 Major depressive disorder, single episode, unspecified: Secondary | ICD-10-CM | POA: Diagnosis not present

## 2015-10-06 DIAGNOSIS — I10 Essential (primary) hypertension: Secondary | ICD-10-CM | POA: Diagnosis not present

## 2015-10-06 DIAGNOSIS — F0390 Unspecified dementia without behavioral disturbance: Secondary | ICD-10-CM | POA: Diagnosis not present

## 2015-10-06 DIAGNOSIS — G20 Parkinson's disease: Secondary | ICD-10-CM | POA: Diagnosis not present

## 2015-10-06 DIAGNOSIS — Z8673 Personal history of transient ischemic attack (TIA), and cerebral infarction without residual deficits: Secondary | ICD-10-CM | POA: Diagnosis not present

## 2015-10-13 DIAGNOSIS — Z8673 Personal history of transient ischemic attack (TIA), and cerebral infarction without residual deficits: Secondary | ICD-10-CM | POA: Diagnosis not present

## 2015-10-13 DIAGNOSIS — F319 Bipolar disorder, unspecified: Secondary | ICD-10-CM | POA: Diagnosis not present

## 2015-10-13 DIAGNOSIS — G8929 Other chronic pain: Secondary | ICD-10-CM | POA: Diagnosis not present

## 2015-10-13 DIAGNOSIS — I10 Essential (primary) hypertension: Secondary | ICD-10-CM | POA: Diagnosis not present

## 2015-10-13 DIAGNOSIS — F329 Major depressive disorder, single episode, unspecified: Secondary | ICD-10-CM | POA: Diagnosis not present

## 2015-10-13 DIAGNOSIS — B9689 Other specified bacterial agents as the cause of diseases classified elsewhere: Secondary | ICD-10-CM | POA: Diagnosis not present

## 2015-10-13 DIAGNOSIS — N10 Acute tubulo-interstitial nephritis: Secondary | ICD-10-CM | POA: Diagnosis not present

## 2015-10-13 DIAGNOSIS — F0390 Unspecified dementia without behavioral disturbance: Secondary | ICD-10-CM | POA: Diagnosis not present

## 2015-10-13 DIAGNOSIS — G20 Parkinson's disease: Secondary | ICD-10-CM | POA: Diagnosis not present

## 2015-10-20 ENCOUNTER — Telehealth: Payer: Self-pay | Admitting: Internal Medicine

## 2015-10-20 NOTE — Telephone Encounter (Signed)
Spoke with patient's wife. She states he wants Dr. Havery Moros. He has had diarrhea x 2 weeks. Has 2-3 diarrhea stools/day. No bleeding. Scheduled with Nicoletta Ba, PA on 10/25/15 at 1:30 PM.

## 2015-10-22 DIAGNOSIS — G20 Parkinson's disease: Secondary | ICD-10-CM | POA: Diagnosis not present

## 2015-10-22 DIAGNOSIS — N10 Acute tubulo-interstitial nephritis: Secondary | ICD-10-CM | POA: Diagnosis not present

## 2015-10-22 DIAGNOSIS — I10 Essential (primary) hypertension: Secondary | ICD-10-CM | POA: Diagnosis not present

## 2015-10-22 DIAGNOSIS — Z8673 Personal history of transient ischemic attack (TIA), and cerebral infarction without residual deficits: Secondary | ICD-10-CM | POA: Diagnosis not present

## 2015-10-22 DIAGNOSIS — F329 Major depressive disorder, single episode, unspecified: Secondary | ICD-10-CM | POA: Diagnosis not present

## 2015-10-22 DIAGNOSIS — G8929 Other chronic pain: Secondary | ICD-10-CM | POA: Diagnosis not present

## 2015-10-22 DIAGNOSIS — F319 Bipolar disorder, unspecified: Secondary | ICD-10-CM | POA: Diagnosis not present

## 2015-10-22 DIAGNOSIS — F0390 Unspecified dementia without behavioral disturbance: Secondary | ICD-10-CM | POA: Diagnosis not present

## 2015-10-22 DIAGNOSIS — B9689 Other specified bacterial agents as the cause of diseases classified elsewhere: Secondary | ICD-10-CM | POA: Diagnosis not present

## 2015-10-25 ENCOUNTER — Telehealth: Payer: Self-pay | Admitting: Physician Assistant

## 2015-10-25 ENCOUNTER — Ambulatory Visit (INDEPENDENT_AMBULATORY_CARE_PROVIDER_SITE_OTHER): Payer: Medicare Other | Admitting: Physician Assistant

## 2015-10-25 ENCOUNTER — Encounter: Payer: Self-pay | Admitting: Physician Assistant

## 2015-10-25 ENCOUNTER — Other Ambulatory Visit (INDEPENDENT_AMBULATORY_CARE_PROVIDER_SITE_OTHER): Payer: Medicare Other

## 2015-10-25 VITALS — BP 124/80 | HR 66 | Ht 72.0 in | Wt 193.2 lb

## 2015-10-25 DIAGNOSIS — R197 Diarrhea, unspecified: Secondary | ICD-10-CM

## 2015-10-25 DIAGNOSIS — A09 Infectious gastroenteritis and colitis, unspecified: Secondary | ICD-10-CM

## 2015-10-25 DIAGNOSIS — R110 Nausea: Secondary | ICD-10-CM

## 2015-10-25 DIAGNOSIS — R634 Abnormal weight loss: Secondary | ICD-10-CM

## 2015-10-25 LAB — BASIC METABOLIC PANEL
BUN: 14 mg/dL (ref 6–23)
CALCIUM: 9.1 mg/dL (ref 8.4–10.5)
CO2: 28 meq/L (ref 19–32)
Chloride: 105 mEq/L (ref 96–112)
Creatinine, Ser: 0.97 mg/dL (ref 0.40–1.50)
GFR: 81.25 mL/min (ref >60.00)
GLUCOSE: 110 mg/dL — AB (ref 70–99)
Potassium: 3.5 mEq/L (ref 3.5–5.1)
SODIUM: 140 meq/L (ref 135–145)

## 2015-10-25 MED ORDER — PROCHLORPERAZINE MALEATE 5 MG PO TABS: ORAL_TABLET | ORAL | Status: DC

## 2015-10-25 MED ORDER — SACCHAROMYCES BOULARDII 250 MG PO CAPS: 250.0000 mg | ORAL_CAPSULE | Freq: Two times a day (BID) | ORAL | Status: DC

## 2015-10-25 MED ORDER — METRONIDAZOLE 250 MG PO TABS: 250.0000 mg | ORAL_TABLET | Freq: Four times a day (QID) | ORAL | Status: DC

## 2015-10-25 NOTE — Progress Notes (Signed)
Patient ID: [name], male   DOB: [birth year], 69 y.o.   MRN: [id number]   Subjective:    Patient ID: [name], male    DOB: 03-05-1946, 69 y.o.   MRN: [id number]  HPI   69 yo WM known previously to Dr. Olevia Perches who was last seen in 2013 when he had colonoscopy. He had one 3-5 mm sessile polyp removed and biopsy proved a tubular adenoma. Exam was otherwise negative and he was recommended 5 year interval follow-up.  Patient comes in today with an acute diarrheal illness over the past 3 weeks. His wife says she's having 4-5 liquid bowel movements per day and on occasion has had an accident due to urgency. She says the stool is "horrible " smelling. They have not noted any blood but the stool has been very dark intermittently. He has not been taking any Pepto-Bismol. He has been weak nauseated and not eating much over the past week or so. No fever or chills no vomiting. He denies any abdominal pain or cramping. He is having to get up at night at least once every night with diarrhea.  Patient had taken recent antibiotics. He has had urologic workup recently due to a bladder stone which she had removed in early November per Dr. Karsten Ro , and also has just had a Foley catheter removed because of significant problems with urinary retention. Patient's wife states that he had at least 2 courses of antibiotics between the end of October and the beginning of November. Other medical problems include history of CVA, schizophrenia, bipolar disorder and mild dementia.     Review of Systems Pertinent positive and negative review of systems were noted in the above HPI section.  All other review of systems was otherwise negative.  Outpatient Encounter Prescriptions as of 10/25/2015  Medication Sig  . ALPRAZolam (XANAX) 0.5 MG tablet TAKE 1 TABLET BY MOUTH THREE TIMES DAILY AS NEEDED FOR ANXIETY  . amLODipine (NORVASC) 10 MG tablet Take 1 tablet (10 mg total) by mouth daily.  . Bisacodyl (DULCOLAX PO) Take 3  tablets by mouth at bedtime as needed (const).   . carbidopa-levodopa (SINEMET IR) 25-100 MG per tablet Take 1 tablet by mouth 3 (three) times daily.  . Flaxseed, Linseed, (FLAXSEED OIL PO) Take 2 capsules by mouth daily.  Marland Kitchen ibuprofen (ADVIL,MOTRIN) 200 MG tablet Take 200-400 mg by mouth every 6 (six) hours as needed for headache or moderate pain.   . mirtazapine (REMERON) 15 MG tablet Take 1 tablet (15 mg total) by mouth at bedtime.  Marland Kitchen omega-3 acid ethyl esters (LOVAZA) 1 G capsule Take 3 g by mouth daily.  . ondansetron (ZOFRAN) 4 MG tablet Take 1 tablet (4 mg total) by mouth every 6 (six) hours as needed for nausea.  . pantoprazole (PROTONIX) 40 MG tablet Take 1 tablet (40 mg total) by mouth daily. (Patient taking differently: Take 40 mg by mouth every morning. )  . vitamin B-12 1000 MCG tablet Take 1 tablet (1,000 mcg total) by mouth daily.  . metroNIDAZOLE (FLAGYL) 250 MG tablet Take 1 tablet (250 mg total) by mouth 4 (four) times daily.  . prochlorperazine (COMPAZINE) 5 MG tablet Take 1 tab every 6-8 hours as needed for nausea.  Marland Kitchen saccharomyces boulardii (FLORASTOR) 250 MG capsule Take 1 capsule (250 mg total) by mouth 2 (two) times daily.  . [DISCONTINUED] HYDROcodone-acetaminophen (NORCO) 10-325 MG per tablet Take 1-2 tablets by mouth every 4 (four) hours as needed for moderate pain. Maximum  dose per 24 hours -8 pills. (Patient not taking: Reported on 10/25/2015)  . [DISCONTINUED] oxybutynin (DITROPAN) 5 MG tablet Take 1 tablet by mouth every 8 (eight) hours as needed. Bladder spasms   No facility-administered encounter medications on file as of 10/25/2015.   No Known Allergies Patient Active Problem List   Diagnosis Date Noted  . BPH with urinary obstruction 08/13/2015  . Acute pyelonephritis-culture negative s/p multiple rounds Abx 07/21/2015  . Cystitis 07/20/2015  . Obstructed, uropathy 07/20/2015  . History of CVA (cerebrovascular accident) 07/20/2015  . NSVT (nonsustained  ventricular tachycardia) ([place]) 07/20/2015  . Pain in the chest   . Chronic pain syndrome 06/10/2015  . Nausea without vomiting 06/09/2015  . Abnormal breath sounds 12/15/2014  . Parkinson's disease ([place]) 11/25/2014  . Vitamin B 12 deficiency 11/25/2014  . Ataxia 11/23/2014  . Chronic diastolic CHF (congestive heart failure) ([place]) 08/17/2014  . Hypokalemia 08/16/2014  . Weakness generalized 08/15/2014  . Urinary bladder stone 08/12/2014  . Gait disorder 08/12/2014  . Chronic nausea 12/02/2013  . Urinary incontinence 12/02/2013  . Calculus of bladder 10/03/2013  . Chest pain 06/10/2013  . Peripheral edema 06/10/2013  . Dyspepsia 12/24/2012  . Bladder neck obstruction 12/24/2012  . Glucose intolerance (impaired glucose tolerance) 06/27/2012  . Bipolar affective disorder ([place]) 06/27/2012  . Tremor 06/27/2012  . Skin lesion 06/27/2012  . Erectile dysfunction 06/27/2012  . OAB (overactive bladder) 06/27/2012  . Preventative health care 06/22/2012  . Depression 06/22/2012  . GERD (gastroesophageal reflux disease) 06/22/2012  . Fatty liver 06/22/2012  . Chronic headaches 06/22/2012  . Stroke ([place])   . Anxiety   . Skin cancer of nose   . Rectal polyp   . Chest pain 02/01/2012  . BPH (benign prostatic hyperplasia) 02/01/2012  . Dementia   . HTN (hypertension)   . Vitamin B12 deficiency   . Schizophrenia ([place])   . Hyperlipidemia    Social History   Social History  . Marital Status: Married    Spouse Name: N/A  . Number of Children: N/A  . Years of Education: N/A   Occupational History  . Not on file.   Social History Main Topics  . Smoking status: Current Every Day Smoker -- 0.10 packs/day for 48 years    Types: Cigarettes  . Smokeless tobacco: Never Used  . Alcohol Use: No  . Drug Use: No  . Sexual Activity: Not on file   Other Topics Concern  . Not on file   Social History Narrative   Divorced, remarried.  Disabled secondary to psych reasons.    Mr.  [name] family history includes Dementia in his father and mother. There is no history of Colon cancer, Colon polyps, Rectal cancer, or Stomach cancer.      Objective:    Filed Vitals:   10/25/15 1321  BP: 124/80  Pulse: 66    Physical Exam   Well-developed older white male in no acute distress, accompanied by his wife. Blood pressure 124/80 pulse 66 height 6 foot weight 193. HEENT; nontraumatic normocephalic EOMI PERRLA sclera anicteric, Cardiovascular ;regular rate and rhythm with S1-S2 no murmur or gallop, Pulmonary clear bilaterally, Abdomen ;soft, bowel sounds are present no palpable mass or hepatosplenomegaly and no focal tenderness , Rectal ;exam no external lesions noted stool is brown and Hemoccult negative , Extremities; no clubbing cyanosis or edema skin warm and dry, Neuropsych; mood and affect appropriate, responses are slow       Assessment & Plan:    #  1 69 yo male with acute diarrheal illness x 3 weeks associated with poor appetite, nausea, weakness, and weight loss R/O CDiff- especially given recent antibiotic use #2 Hx of Adenomatous polyp- due for F/U colonoscopy 08/2017 #3 recent bladder surgery/bladder stone #4 Dementia #5 Bipolar/schizophrenia #6 hx CVA  Plan; CBC, BMET today  push fluid and bland diet at home  Stool culture and  CDiff PCR Compazine 5 mg po q 6-8 hours prn nausea(Zoframn and phenergan not helpful in the past) Start empiric metronidazole 250 mg po  QID x 14 days Start Florastor BID x one month  Pt and wife asked to call for any worsening of sxs over next several days, and if he does not improve with Flagyl.  Pt  wishes to establish with Dr Havery Moros.    Kalven Ganim Genia Harold PA-C 10/25/2015   Cc: Biagio Borg, MD

## 2015-10-25 NOTE — Patient Instructions (Signed)
Please go to the basement level to have your labs drawn and stool studies. We sent prescriptions to [facility]. 1. Flagyl 250 mg 2. Florastor 3. Compazine 5 mg  Push fluids. Eat a bland diet. Make yourself eat 3-4 times daily.

## 2015-10-25 NOTE — Progress Notes (Signed)
Agree with assessment and plan, would await C Diff testing prior to starting flagyl to ensure we don't get a false negative. Follow up if symptoms persist.

## 2015-10-25 NOTE — Telephone Encounter (Signed)
Spoke to patient's wife, She said they could not afford the Florastor, it is $49.00.  I told her to look at the price of Culturelle, or Align.  I told her the Align may be close to the $ 49.00 but I wasn't sure of the cost.  I told her to look at the different probiotics and choose one she could afford.

## 2015-10-26 LAB — CBC WITH DIFFERENTIAL/PLATELET
Basophils Absolute: 0 10*3/uL (ref 0.0–0.1)
Basophils Relative: 0.1 % (ref 0.0–3.0)
EOS PCT: 0.8 % (ref 0.0–5.0)
Eosinophils Absolute: 0.1 10*3/uL (ref 0.0–0.7)
HEMATOCRIT: 46.3 % (ref 39.0–52.0)
HEMOGLOBIN: 15.6 g/dL (ref 13.0–17.0)
LYMPHS PCT: 12.4 % (ref 12.0–46.0)
Lymphs Abs: 1.1 10*3/uL (ref 0.7–4.0)
MCHC: 33.6 g/dL (ref 30.0–36.0)
MCV: 91.8 fl (ref 78.0–100.0)
MONO ABS: 0.5 10*3/uL (ref 0.1–1.0)
MONOS PCT: 5.4 % (ref 3.0–12.0)
Neutro Abs: 7.1 10*3/uL (ref 1.4–7.7)
Neutrophils Relative %: 81.3 % — ABNORMAL HIGH (ref 43.0–77.0)
Platelets: 273 10*3/uL (ref 150.0–400.0)
RBC: 5.05 Mil/uL (ref 4.22–5.81)
RDW: 13.4 % (ref 11.5–15.5)
WBC: 8.8 10*3/uL (ref 4.0–10.5)

## 2015-11-02 ENCOUNTER — Ambulatory Visit: Payer: Medicare Other

## 2015-11-16 ENCOUNTER — Other Ambulatory Visit: Payer: Self-pay | Admitting: Internal Medicine

## 2015-11-16 NOTE — Telephone Encounter (Signed)
Was refilled on 08/31/15 for 3 month supply. Should not be out yet, will defer to PCP who will return before he runs out.

## 2015-11-16 NOTE — Telephone Encounter (Signed)
Please advise in PCP's absence, thanks! 

## 2015-11-19 ENCOUNTER — Telehealth: Payer: Self-pay | Admitting: Internal Medicine

## 2015-11-19 ENCOUNTER — Other Ambulatory Visit: Payer: Self-pay | Admitting: Internal Medicine

## 2015-11-19 MED ORDER — ALPRAZOLAM 0.5 MG PO TABS: 0.5000 mg | ORAL_TABLET | Freq: Three times a day (TID) | ORAL | Status: DC | PRN

## 2015-11-19 NOTE — Telephone Encounter (Signed)
Patient called in regarding xnax script. He got his last refill on 10/22/2015 and is out as of this weekend. Our side shows a script date of 08/31/2015. They are asking that we call it in sooner than 012/27/2016

## 2015-11-19 NOTE — Telephone Encounter (Signed)
RX faxed to [facility] 

## 2015-11-19 NOTE — Telephone Encounter (Signed)
Ok printed

## 2015-11-19 NOTE — Telephone Encounter (Signed)
Called pharmacy to check refills status on pt alprazolam. Pt brought Sept script in had fill, Oct was filled 10/18, Nov filled 11/18. Pt is due for refill Dec 18th. Which would be Sunday. MD is out of office is it ok to refill...Johny Chess

## 2015-11-26 DIAGNOSIS — G20 Parkinson's disease: Secondary | ICD-10-CM | POA: Diagnosis not present

## 2015-11-26 DIAGNOSIS — F0390 Unspecified dementia without behavioral disturbance: Secondary | ICD-10-CM | POA: Diagnosis not present

## 2015-11-26 DIAGNOSIS — B9689 Other specified bacterial agents as the cause of diseases classified elsewhere: Secondary | ICD-10-CM | POA: Diagnosis not present

## 2015-11-26 DIAGNOSIS — N10 Acute tubulo-interstitial nephritis: Secondary | ICD-10-CM | POA: Diagnosis not present

## 2015-11-28 DIAGNOSIS — R531 Weakness: Secondary | ICD-10-CM | POA: Diagnosis not present

## 2015-11-28 DIAGNOSIS — R404 Transient alteration of awareness: Secondary | ICD-10-CM | POA: Diagnosis not present

## 2015-12-01 ENCOUNTER — Encounter: Payer: Self-pay | Admitting: Internal Medicine

## 2015-12-01 ENCOUNTER — Ambulatory Visit (INDEPENDENT_AMBULATORY_CARE_PROVIDER_SITE_OTHER): Payer: Medicare Other | Admitting: Internal Medicine

## 2015-12-01 VITALS — BP 134/82 | HR 87 | Temp 98.2°F

## 2015-12-01 DIAGNOSIS — R350 Frequency of micturition: Secondary | ICD-10-CM | POA: Diagnosis not present

## 2015-12-01 DIAGNOSIS — R4182 Altered mental status, unspecified: Secondary | ICD-10-CM

## 2015-12-01 DIAGNOSIS — R29898 Other symptoms and signs involving the musculoskeletal system: Secondary | ICD-10-CM

## 2015-12-01 NOTE — Patient Instructions (Signed)
Please go to ER now for symptoms of weakness, worsening mental functioning, and urinary frequency.  Please continue all other medications as before, and refills have been done if requested.  Please have the pharmacy call with any other refills you may need.  Please keep your appointments with your specialists as you may have planned

## 2015-12-01 NOTE — Assessment & Plan Note (Signed)
Suggestive of possible UTI - would help explain AMS and weakness as well if true, would suggest check UA as well

## 2015-12-01 NOTE — Progress Notes (Signed)
Subjective:    Patient ID: [name], male    DOB: [birth year], 69 y.o.   MRN: [id number]  HPI  Here to f/u with acute concern of onset dec 25 unusual for him symptoms 5 mins of difficulty with speech and coming up with words, seemed to look through the wife as she tried to get him to stand, and weakness such that fell back on the sofa.  EMS called, exam improved before they arrived and was alert, oriented, and conversational, found to have stable VS and EKG and EMS left.  Still with significant weakness to both legs, pastor next door had to come help get him up to the bed and BR that night, tends to want to fall backwards.  Prior to dec 25 was able to help with getting OOB, but since has had to have help even transfers from bed to chair or BR. May have mild slurred speech per wife and some drooling.  Last night said he felt hot and did clammy sweats.  Cannot stand well but is able to stand from wheelchiair position today with pushing off and no assist, can only stand les sthan 1 minute. Overall good compliance with meds. Denies overt fever, has had some urinary difficulty with small volumes and high frequency and incresaed nocturia. Past Medical History  Diagnosis Date  . Hyperlipidemia   . Vitamin B12 deficiency   . Occasional tremors     leg tremors  . History of CVA (cerebrovascular accident) without residual deficits     2011  . Other degenerative diseases of the basal ganglia     S/P CVA 2011--  CHRONIC LEFT BASAL GANGLIA LACUNAR INFARCT  PER CT  . History of nonmelanoma skin cancer     EXCISION OF NOSE  . Hypertension   . Schizophrenia ([place])     HX PSYCHIATRIC ADMISSION'S  AND ECT ([place]) [place]  . Bipolar affective disorder ([place])   . ED (erectile dysfunction) of organic origin   . Dementia   . Depression   . Generalized weakness     LOWER EXTREMITIES  . Chronic anxiety   . Urinary bladder stone   . History of bladder stone   . BPH (benign prostatic hyperplasia)   . Lower  urinary tract symptoms (LUTS)   . Elevated PSA   . History of acute pyelonephritis     admission 07-19-2015  . Chronic constipation    Past Surgical History  Procedure Laterality Date  . Transanal excision anal and rectal polyps  11-02-2000  . Transthoracic echocardiogram  08-16-2014    Mild LVH/ grade I diastolic dysfunction/  ef 60-65%/  mild AV calification without stenosis/  mild RAE  . Cardiovascular stress test  06-18-2013  DR Jenkins [place]    LOW RISK NO EXERCISE LEXISCAN STUDY/  NO ISCHEMIA, CANNOT RULE OUT BASAL INFERIOR INFARCTION BUT POSSIBLE ARTIFACT GIVEN NORMAL WALL MOTION/  EF 73%  . Excision tumor upper left thigh  2012  . Transurethral incision of prostate N/A 10/03/2013    Procedure: TRANSURETHRAL INCISION OF THE PROSTATE (TUIP);  Surgeon: Claybon Jabs, MD;  Location: [facility];  Service: Urology;  Laterality: N/A;  . Cystoscopy with litholapaxy N/A 10/03/2013    Procedure: CYSTOSCOPY WITH LITHOLAPAXY;  Surgeon: Claybon Jabs, MD;  Location: [facility];  Service: Urology;  Laterality: N/A;  . Holmium laser application N/A [id number]    Procedure: HOLMIUM LASER APPLICATION;  Surgeon: Claybon Jabs, MD;  Location: [facility];  Service: Urology;  Laterality: N/A;  . Cystoscopy with litholapaxy N/A 08/13/2015    Procedure: CYSTOSCOPY WITH LITHOLAPAXY;  Surgeon: Kathie Rhodes, MD;  Location: [facility];  Service: Urology;  Laterality: N/A;  . Transurethral resection of prostate N/A 08/13/2015    Procedure: TRANSURETHRAL RESECTION OF THE PROSTATE (TURP);  Surgeon: Kathie Rhodes, MD;  Location: [facility];  Service: Urology;  Laterality: N/A;  . Holmium laser application N/A [id number]    Procedure: HOLMIUM LASER APPLICATION;  Surgeon: Kathie Rhodes, MD;  Location: [facility];  Service: Urology;  Laterality: N/A;    reports that he has been smoking Cigarettes.  He has a 4.8 pack-year  smoking history. He has never used smokeless tobacco. He reports that he does not drink alcohol or use illicit drugs. family history includes Dementia in his father and mother. There is no history of Colon cancer, Colon polyps, Rectal cancer, or Stomach cancer. No Known Allergies Current Outpatient Prescriptions on File Prior to Visit  Medication Sig Dispense Refill  . ALPRAZolam (XANAX) 0.5 MG tablet Take 1 tablet (0.5 mg total) by mouth 3 (three) times daily as needed. for anxiety 90 tablet 0  . amLODipine (NORVASC) 10 MG tablet Take 1 tablet (10 mg total) by mouth daily. 30 tablet 3  . Bisacodyl (DULCOLAX PO) Take 3 tablets by mouth at bedtime as needed (const).     . carbidopa-levodopa (SINEMET IR) 25-100 MG per tablet Take 1 tablet by mouth 3 (three) times daily. 90 tablet 5  . Flaxseed, Linseed, (FLAXSEED OIL PO) Take 2 capsules by mouth daily.    Marland Kitchen ibuprofen (ADVIL,MOTRIN) 200 MG tablet Take 200-400 mg by mouth every 6 (six) hours as needed for headache or moderate pain.     . metroNIDAZOLE (FLAGYL) 250 MG tablet Take 1 tablet (250 mg total) by mouth 4 (four) times daily. 56 tablet 0  . mirtazapine (REMERON) 15 MG tablet Take 1 tablet (15 mg total) by mouth at bedtime. 90 tablet 3  . omega-3 acid ethyl esters (LOVAZA) 1 G capsule Take 3 g by mouth daily.    . ondansetron (ZOFRAN) 4 MG tablet Take 1 tablet (4 mg total) by mouth every 6 (six) hours as needed for nausea. 20 tablet 0  . pantoprazole (PROTONIX) 40 MG tablet Take 1 tablet (40 mg total) by mouth daily. (Patient taking differently: Take 40 mg by mouth every morning. ) 90 tablet 3  . prochlorperazine (COMPAZINE) 5 MG tablet Take 1 tab every 6-8 hours as needed for nausea. 50 tablet 0  . saccharomyces boulardii (FLORASTOR) 250 MG capsule Take 1 capsule (250 mg total) by mouth 2 (two) times daily. 60 capsule 0  . vitamin B-12 1000 MCG tablet Take 1 tablet (1,000 mcg total) by mouth daily. 30 tablet 0   No current  facility-administered medications on file prior to visit.     Review of Systems  Constitutional: Negative for unusual diaphoresis or night sweats HENT: Negative for ringing in ear or discharge Eyes: Negative for double vision or worsening visual disturbance.  Respiratory: Negative for choking and stridor.   Gastrointestinal: Negative for vomiting or other signifcant bowel change Genitourinary: Negative for hematuria or change in urine volume.  Musculoskeletal: Negative for other MSK pain or swelling Skin: Negative for color change and worsening wound.  Neurological: Negative for tremors and numbness other than noted  Psychiatric/Behavioral: Negative for decreased concentration or agitation other than above  Objective:   Physical Exam BP 134/82 mmHg  Pulse 87  Temp(Src) 98.2 F (36.8 C) (Oral)  SpO2 97% VS noted,  Constitutional: Pt appears in no significant distress HENT: Head: NCAT.  Right Ear: External ear normal.  Left Ear: External ear normal.  Eyes: . Pupils are equal, round, and reactive to light. Conjunctivae and EOM are normal Neck: Normal range of motion. Neck supple.  Cardiovascular: Normal rate and regular rhythm.   Pulmonary/Chest: Effort normal and breath sounds without rales or wheezing.  Abd:  Soft, NT, ND, + BS Neurological: Pt is alert. + confused ,weak, hard to understand speech, motor grossly intact however, alert and follow commands Skin: Skin is warm. No rash, no LE edema Psychiatric: Pt behavior is normal. No agitation.     Assessment & Plan:

## 2015-12-01 NOTE — Assessment & Plan Note (Addendum)
With hx of parkinsons bipolar and dementia, but now worse last few days acutely for unclear reason, has hx of cva and symptoms recent somewhat suggestive of TIA; I believe pt should be eval at ER and consider CNS imaging and /or neurology consult

## 2015-12-01 NOTE — Progress Notes (Signed)
Pre visit review using our clinic review tool, if applicable. No additional management support is needed unless otherwise documented below in the visit note. 

## 2015-12-01 NOTE — Assessment & Plan Note (Signed)
Acute, etiology unclear, also for ER eval

## 2015-12-02 ENCOUNTER — Emergency Department (HOSPITAL_COMMUNITY): Payer: Medicare Other

## 2015-12-02 ENCOUNTER — Observation Stay (HOSPITAL_COMMUNITY)
Admission: EM | Admit: 2015-12-02 | Discharge: 2015-12-03 | Disposition: A | Discharge status: Home / Self Care | Payer: Medicare Other | Attending: Internal Medicine | Admitting: Internal Medicine

## 2015-12-02 ENCOUNTER — Observation Stay (HOSPITAL_COMMUNITY): Payer: Medicare Other

## 2015-12-02 ENCOUNTER — Encounter (HOSPITAL_COMMUNITY): Payer: Self-pay | Admitting: Emergency Medicine

## 2015-12-02 DIAGNOSIS — G20 Parkinson's disease: Secondary | ICD-10-CM | POA: Diagnosis not present

## 2015-12-02 DIAGNOSIS — I10 Essential (primary) hypertension: Secondary | ICD-10-CM | POA: Insufficient documentation

## 2015-12-02 DIAGNOSIS — E538 Deficiency of other specified B group vitamins: Secondary | ICD-10-CM | POA: Diagnosis not present

## 2015-12-02 DIAGNOSIS — Z8673 Personal history of transient ischemic attack (TIA), and cerebral infarction without residual deficits: Secondary | ICD-10-CM | POA: Diagnosis not present

## 2015-12-02 DIAGNOSIS — R112 Nausea with vomiting, unspecified: Secondary | ICD-10-CM | POA: Diagnosis not present

## 2015-12-02 DIAGNOSIS — E785 Hyperlipidemia, unspecified: Secondary | ICD-10-CM | POA: Diagnosis not present

## 2015-12-02 DIAGNOSIS — G459 Transient cerebral ischemic attack, unspecified: Secondary | ICD-10-CM | POA: Diagnosis present

## 2015-12-02 DIAGNOSIS — N400 Enlarged prostate without lower urinary tract symptoms: Secondary | ICD-10-CM | POA: Insufficient documentation

## 2015-12-02 DIAGNOSIS — F209 Schizophrenia, unspecified: Secondary | ICD-10-CM | POA: Insufficient documentation

## 2015-12-02 DIAGNOSIS — R531 Weakness: Secondary | ICD-10-CM

## 2015-12-02 DIAGNOSIS — R2681 Unsteadiness on feet: Secondary | ICD-10-CM | POA: Diagnosis not present

## 2015-12-02 DIAGNOSIS — Z85828 Personal history of other malignant neoplasm of skin: Secondary | ICD-10-CM | POA: Diagnosis not present

## 2015-12-02 DIAGNOSIS — G458 Other transient cerebral ischemic attacks and related syndromes: Secondary | ICD-10-CM

## 2015-12-02 DIAGNOSIS — R55 Syncope and collapse: Secondary | ICD-10-CM | POA: Insufficient documentation

## 2015-12-02 DIAGNOSIS — R4182 Altered mental status, unspecified: Secondary | ICD-10-CM | POA: Insufficient documentation

## 2015-12-02 DIAGNOSIS — E876 Hypokalemia: Secondary | ICD-10-CM | POA: Diagnosis not present

## 2015-12-02 DIAGNOSIS — K219 Gastro-esophageal reflux disease without esophagitis: Secondary | ICD-10-CM | POA: Diagnosis not present

## 2015-12-02 DIAGNOSIS — R270 Ataxia, unspecified: Secondary | ICD-10-CM | POA: Diagnosis not present

## 2015-12-02 DIAGNOSIS — I471 Supraventricular tachycardia: Secondary | ICD-10-CM | POA: Diagnosis not present

## 2015-12-02 DIAGNOSIS — R4789 Other speech disturbances: Secondary | ICD-10-CM | POA: Diagnosis not present

## 2015-12-02 DIAGNOSIS — G8929 Other chronic pain: Secondary | ICD-10-CM | POA: Insufficient documentation

## 2015-12-02 DIAGNOSIS — N528 Other male erectile dysfunction: Secondary | ICD-10-CM | POA: Insufficient documentation

## 2015-12-02 DIAGNOSIS — F0390 Unspecified dementia without behavioral disturbance: Secondary | ICD-10-CM | POA: Diagnosis not present

## 2015-12-02 DIAGNOSIS — F319 Bipolar disorder, unspecified: Secondary | ICD-10-CM | POA: Diagnosis not present

## 2015-12-02 DIAGNOSIS — I6523 Occlusion and stenosis of bilateral carotid arteries: Secondary | ICD-10-CM | POA: Diagnosis not present

## 2015-12-02 DIAGNOSIS — F419 Anxiety disorder, unspecified: Secondary | ICD-10-CM | POA: Insufficient documentation

## 2015-12-02 DIAGNOSIS — I6782 Cerebral ischemia: Secondary | ICD-10-CM | POA: Diagnosis not present

## 2015-12-02 DIAGNOSIS — R29810 Facial weakness: Secondary | ICD-10-CM | POA: Diagnosis not present

## 2015-12-02 LAB — COMPREHENSIVE METABOLIC PANEL
ALK PHOS: 92 U/L (ref 38–126)
ALT: 18 U/L (ref 17–63)
ANION GAP: 10 (ref 5–15)
AST: 20 U/L (ref 15–41)
Albumin: 4 g/dL (ref 3.5–5.0)
BILIRUBIN TOTAL: 0.6 mg/dL (ref 0.3–1.2)
BUN: 16 mg/dL (ref 6–20)
CALCIUM: 9.1 mg/dL (ref 8.9–10.3)
CO2: 28 mmol/L (ref 22–32)
CREATININE: 0.8 mg/dL (ref 0.61–1.24)
Chloride: 106 mmol/L (ref 101–111)
Glucose, Bld: 100 mg/dL — ABNORMAL HIGH (ref 65–99)
Potassium: 3.2 mmol/L — ABNORMAL LOW (ref 3.5–5.1)
Sodium: 144 mmol/L (ref 135–145)
TOTAL PROTEIN: 6.8 g/dL (ref 6.5–8.1)

## 2015-12-02 LAB — URINE MICROSCOPIC-ADD ON

## 2015-12-02 LAB — TROPONIN I: Troponin I: <0.03 ng/mL (ref <0.031)

## 2015-12-02 LAB — CBC
HEMATOCRIT: 46 % (ref 39.0–52.0)
HEMOGLOBIN: 15.8 g/dL (ref 13.0–17.0)
MCH: 31.2 pg (ref 26.0–34.0)
MCHC: 34.3 g/dL (ref 30.0–36.0)
MCV: 90.7 fL (ref 78.0–100.0)
Platelets: 220 10*3/uL (ref 150–400)
RBC: 5.07 MIL/uL (ref 4.22–5.81)
RDW: 13 % (ref 11.5–15.5)
WBC: 7.4 10*3/uL (ref 4.0–10.5)

## 2015-12-02 LAB — URINALYSIS, ROUTINE W REFLEX MICROSCOPIC
BILIRUBIN URINE: NEGATIVE
Glucose, UA: NEGATIVE mg/dL
Hgb urine dipstick: NEGATIVE
KETONES UR: NEGATIVE mg/dL
NITRITE: NEGATIVE
PROTEIN: NEGATIVE mg/dL
Specific Gravity, Urine: 1.017 (ref 1.005–1.030)
pH: 6.5 (ref 5.0–8.0)

## 2015-12-02 LAB — MRSA PCR SCREENING: MRSA by PCR: NEGATIVE

## 2015-12-02 MED ORDER — AMLODIPINE BESYLATE 5 MG PO TABS
5.0000 mg | ORAL_TABLET | Freq: Every day | ORAL | Status: DC
  Administered 2015-12-02 – 2015-12-03 (×2): 5 mg via ORAL
  Filled 2015-12-02 (×2): qty 1

## 2015-12-02 MED ORDER — SENNOSIDES-DOCUSATE SODIUM 8.6-50 MG PO TABS
1.0000 | ORAL_TABLET | Freq: Every evening | ORAL | Status: DC | PRN
Start: 2015-12-02 — End: 2015-12-03

## 2015-12-02 MED ORDER — SACCHAROMYCES BOULARDII 250 MG PO CAPS
250.0000 mg | ORAL_CAPSULE | Freq: Two times a day (BID) | ORAL | Status: DC
  Administered 2015-12-02 – 2015-12-03 (×2): 250 mg via ORAL
  Filled 2015-12-02 (×2): qty 1

## 2015-12-02 MED ORDER — AMLODIPINE BESYLATE 10 MG PO TABS
10.0000 mg | ORAL_TABLET | Freq: Every day | ORAL | Status: DC
  Administered 2015-12-02: 10 mg via ORAL
  Filled 2015-12-02: qty 1

## 2015-12-02 MED ORDER — OMEGA-3-ACID ETHYL ESTERS 1 G PO CAPS
3.0000 g | ORAL_CAPSULE | Freq: Every day | ORAL | Status: DC
  Administered 2015-12-02 – 2015-12-03 (×2): 3 g via ORAL
  Filled 2015-12-02 (×2): qty 3

## 2015-12-02 MED ORDER — LORAZEPAM 2 MG/ML IJ SOLN
1.0000 mg | Freq: Once | INTRAMUSCULAR | Status: AC
  Administered 2015-12-02: 1 mg via INTRAVENOUS
  Filled 2015-12-02: qty 1

## 2015-12-02 MED ORDER — ALPRAZOLAM 0.5 MG PO TABS
0.5000 mg | ORAL_TABLET | Freq: Three times a day (TID) | ORAL | Status: DC | PRN
Start: 2015-12-02 — End: 2015-12-03
  Administered 2015-12-02: 0.5 mg via ORAL
  Filled 2015-12-02: qty 1

## 2015-12-02 MED ORDER — CARBIDOPA-LEVODOPA 25-100 MG PO TABS
1.0000 | ORAL_TABLET | Freq: Every day | ORAL | Status: DC
  Administered 2015-12-02 – 2015-12-03 (×4): 1 via ORAL
  Filled 2015-12-02 (×9): qty 1

## 2015-12-02 MED ORDER — SODIUM CHLORIDE 0.9 % IV SOLN
INTRAVENOUS | Status: DC
  Administered 2015-12-02: 10:00:00 via INTRAVENOUS

## 2015-12-02 MED ORDER — TRAMADOL HCL 50 MG PO TABS
50.0000 mg | ORAL_TABLET | Freq: Four times a day (QID) | ORAL | Status: DC | PRN
  Administered 2015-12-02 – 2015-12-03 (×2): 50 mg via ORAL
  Filled 2015-12-02 (×2): qty 1

## 2015-12-02 MED ORDER — ASPIRIN 300 MG RE SUPP: 300.0000 mg | Freq: Every day | RECTAL | Status: DC

## 2015-12-02 MED ORDER — ENOXAPARIN SODIUM 40 MG/0.4ML ~~LOC~~ SOLN
40.0000 mg | SUBCUTANEOUS | Status: DC
  Administered 2015-12-02: 40 mg via SUBCUTANEOUS
  Filled 2015-12-02: qty 0.4

## 2015-12-02 MED ORDER — STROKE: EARLY STAGES OF RECOVERY BOOK
Freq: Once | Status: AC
  Administered 2015-12-02: 17:00:00
  Filled 2015-12-02: qty 1

## 2015-12-02 MED ORDER — POTASSIUM CHLORIDE CRYS ER 20 MEQ PO TBCR
40.0000 meq | EXTENDED_RELEASE_TABLET | Freq: Once | ORAL | Status: AC
  Administered 2015-12-02: 40 meq via ORAL
  Filled 2015-12-02: qty 2

## 2015-12-02 MED ORDER — ONDANSETRON HCL 4 MG PO TABS
4.0000 mg | ORAL_TABLET | Freq: Four times a day (QID) | ORAL | Status: DC | PRN
  Administered 2015-12-02: 4 mg via ORAL
  Filled 2015-12-02: qty 1

## 2015-12-02 MED ORDER — MIRTAZAPINE 15 MG PO TABS
15.0000 mg | ORAL_TABLET | Freq: Every day | ORAL | Status: DC
  Administered 2015-12-02: 15 mg via ORAL
  Filled 2015-12-02: qty 1

## 2015-12-02 MED ORDER — ASPIRIN 325 MG PO TABS
325.0000 mg | ORAL_TABLET | Freq: Every day | ORAL | Status: DC
  Administered 2015-12-02 – 2015-12-03 (×2): 325 mg via ORAL
  Filled 2015-12-02 (×2): qty 1

## 2015-12-02 MED ORDER — CARBIDOPA-LEVODOPA 25-100 MG PO TABS
1.0000 | ORAL_TABLET | Freq: Three times a day (TID) | ORAL | Status: DC
  Administered 2015-12-02: 1 via ORAL
  Filled 2015-12-02 (×2): qty 1

## 2015-12-02 MED ORDER — VITAMIN B-12 1000 MCG PO TABS
1000.0000 ug | ORAL_TABLET | Freq: Every day | ORAL | Status: DC
  Administered 2015-12-02 – 2015-12-03 (×2): 1000 ug via ORAL
  Filled 2015-12-02 (×2): qty 1

## 2015-12-02 MED ORDER — PANTOPRAZOLE SODIUM 40 MG PO TBEC
40.0000 mg | DELAYED_RELEASE_TABLET | Freq: Every day | ORAL | Status: DC
  Administered 2015-12-02 – 2015-12-03 (×2): 40 mg via ORAL
  Filled 2015-12-02 (×2): qty 1

## 2015-12-02 NOTE — Progress Notes (Signed)
Cm briefly reviewed EDP and pcp notes Cm assessed pt for baseline mobility and DME Wife at bedside Pt with slow, slurred speech but very pleasant and joking with Cm throughout assessment No DME at home At baseline pt was walking without any DME until christmas eve evening Wife interested in hospital bed and w/c  Wife and pt states previous use of [facility] and this would be their choice of home health agency to follow pt if needed at d/c  Cm discussed consulting [facility] and having them to follow pt for d/c needs if admitted Pt agreed to this

## 2015-12-02 NOTE — ED Notes (Signed)
Patient transported to MRI 

## 2015-12-02 NOTE — ED Provider Notes (Addendum)
CSN: [id number]     Arrival date & time 12/02/15  0900 History   First MD Initiated Contact with Patient 12/02/15 0902     Chief Complaint  Patient presents with  . Gait Problem     (Consider location/radiation/quality/duration/timing/severity/associated sxs/prior Treatment) The history is provided by the patient and the spouse.  Patient with hx Parkinsons, prior cva, presents w acute gait disturbance the past 4-5 days. Spouse notes on xmas eve, pt w ?facial/eyelid droop, and weakness, inability to walk on own.  States prior to that patient was ambulatory without assistance. Gait disturbance, requiring assistance to stand/walk has persisted since then.  No trauma/injury.  Pt denies change in vision or speech, although spouse had noted difficulty finding words at time of onset symptoms. Pt denies unilateral numbness or weakness, states feels weak all over. Has intermittently c/o diffuse back and bilateral lower leg pain in the past few days. No radicular pain. No saddle anesthesia. No problems w urinary retention, or incontinence. Compliant w normal meds. No recent change in meds or doses. No fever or chills. No chest pain or discomfort. Denies cough or uri c/o. No nvd. No dysuria.      Past Medical History  Diagnosis Date  . Hyperlipidemia   . Vitamin B12 deficiency   . Occasional tremors     leg tremors  . History of CVA (cerebrovascular accident) without residual deficits     2011  . Other degenerative diseases of the basal ganglia     S/P CVA 2011--  CHRONIC LEFT BASAL GANGLIA LACUNAR INFARCT  PER CT  . History of nonmelanoma skin cancer     EXCISION OF NOSE  . Hypertension   . Schizophrenia ([place])     HX PSYCHIATRIC ADMISSION'S  AND ECT ([place]) [place]  . Bipolar affective disorder ([place])   . ED (erectile dysfunction) of organic origin   . Dementia   . Depression   . Generalized weakness     LOWER EXTREMITIES  . Chronic anxiety   . Urinary bladder stone   . History of bladder  stone   . BPH (benign prostatic hyperplasia)   . Lower urinary tract symptoms (LUTS)   . Elevated PSA   . History of acute pyelonephritis     admission 07-19-2015  . Chronic constipation    Past Surgical History  Procedure Laterality Date  . Transanal excision anal and rectal polyps  11-02-2000  . Transthoracic echocardiogram  08-16-2014    Mild LVH/ grade I diastolic dysfunction/  ef 60-65%/  mild AV calification without stenosis/  mild RAE  . Cardiovascular stress test  06-18-2013  DR Jenkins [place]    LOW RISK NO EXERCISE LEXISCAN STUDY/  NO ISCHEMIA, CANNOT RULE OUT BASAL INFERIOR INFARCTION BUT POSSIBLE ARTIFACT GIVEN NORMAL WALL MOTION/  EF 73%  . Excision tumor upper left thigh  2012  . Transurethral incision of prostate N/A 10/03/2013    Procedure: TRANSURETHRAL INCISION OF THE PROSTATE (TUIP);  Surgeon: Claybon Jabs, MD;  Location: [facility];  Service: Urology;  Laterality: N/A;  . Cystoscopy with litholapaxy N/A 10/03/2013    Procedure: CYSTOSCOPY WITH LITHOLAPAXY;  Surgeon: Claybon Jabs, MD;  Location: [facility];  Service: Urology;  Laterality: N/A;  . Holmium laser application N/A [id number]    Procedure: HOLMIUM LASER APPLICATION;  Surgeon: Claybon Jabs, MD;  Location: [facility];  Service: Urology;  Laterality: N/A;  . Cystoscopy with litholapaxy N/A 08/13/2015    Procedure:  CYSTOSCOPY WITH LITHOLAPAXY;  Surgeon: Kathie Rhodes, MD;  Location: [facility];  Service: Urology;  Laterality: N/A;  . Transurethral resection of prostate N/A 08/13/2015    Procedure: TRANSURETHRAL RESECTION OF THE PROSTATE (TURP);  Surgeon: Kathie Rhodes, MD;  Location: [facility]  Service: Urology;  Laterality: N/A;  . Holmium laser application N/A [id number]    Procedure: HOLMIUM LASER APPLICATION;  Surgeon: Kathie Rhodes, MD;  Location: [facility];  Service: Urology;  Laterality: N/A;   Family History   Problem Relation Age of Onset  . Dementia Father   . Dementia Mother   . Colon cancer Neg Hx   . Colon polyps Neg Hx   . Rectal cancer Neg Hx   . Stomach cancer Neg Hx    Social History  Substance Use Topics  . Smoking status: Current Every Day Smoker -- 0.10 packs/day for 48 years    Types: Cigarettes  . Smokeless tobacco: Never Used  . Alcohol Use: No    Review of Systems  Constitutional: Negative for fever and chills.  HENT: Negative for sore throat.   Eyes: Negative for pain and visual disturbance.  Respiratory: Negative for shortness of breath.   Cardiovascular: Negative for chest pain and leg swelling.  Gastrointestinal: Negative for vomiting, abdominal pain and diarrhea.  Genitourinary: Negative for dysuria and flank pain.  Musculoskeletal: Positive for back pain and gait problem. Negative for neck pain.  Skin: Negative for rash.  Neurological: Negative for numbness and headaches.  Hematological: Does not bruise/bleed easily.  Psychiatric/Behavioral: Negative for confusion.      Allergies  Review of patient's allergies indicates no known allergies.  Home Medications   Prior to Admission medications   Medication Sig Start Date End Date Taking? Authorizing Provider  ALPRAZolam Duanne Moron) 0.5 MG tablet Take 1 tablet (0.5 mg total) by mouth 3 (three) times daily as needed. for anxiety 11/19/15   Binnie Rail, MD  amLODipine (NORVASC) 10 MG tablet Take 1 tablet (10 mg total) by mouth daily. 11/25/14   Reyne Dumas, MD  Bisacodyl (DULCOLAX PO) Take 3 tablets by mouth at bedtime as needed (const).     Historical Provider, MD  carbidopa-levodopa (SINEMET IR) 25-100 MG per tablet Take 1 tablet by mouth 3 (three) times daily. 06/09/15   Biagio Borg, MD  Flaxseed, Linseed, (FLAXSEED OIL PO) Take 2 capsules by mouth daily.    Historical Provider, MD  ibuprofen (ADVIL,MOTRIN) 200 MG tablet Take 200-400 mg by mouth every 6 (six) hours as needed for headache or moderate pain.      Historical Provider, MD  metroNIDAZOLE (FLAGYL) 250 MG tablet Take 1 tablet (250 mg total) by mouth 4 (four) times daily. 10/25/15   Amy S Esterwood, PA-C  mirtazapine (REMERON) 15 MG tablet Take 1 tablet (15 mg total) by mouth at bedtime. 06/09/15   Biagio Borg, MD  omega-3 acid ethyl esters (LOVAZA) 1 G capsule Take 3 g by mouth daily.    Historical Provider, MD  ondansetron (ZOFRAN) 4 MG tablet Take 1 tablet (4 mg total) by mouth every 6 (six) hours as needed for nausea. 07/22/15   Nita Sells, MD  pantoprazole (PROTONIX) 40 MG tablet Take 1 tablet (40 mg total) by mouth daily. Patient taking differently: Take 40 mg by mouth every morning.  06/09/15   Biagio Borg, MD  prochlorperazine (COMPAZINE) 5 MG tablet Take 1 tab every 6-8 hours as needed for nausea. 10/25/15   Amy S  Esterwood, PA-C  saccharomyces boulardii (FLORASTOR) 250 MG capsule Take 1 capsule (250 mg total) by mouth 2 (two) times daily. 10/25/15   Amy S Esterwood, PA-C  vitamin B-12 1000 MCG tablet Take 1 tablet (1,000 mcg total) by mouth daily. 08/17/14   Janece Canterbury, MD   BP 134/86 mmHg  Pulse 86  Resp 17  Ht 6\' 2"  (1.88 m)  Wt 90.266 kg  BMI 25.54 kg/m2  SpO2 99% Physical Exam  Constitutional: He is oriented to person, place, and time. He appears well-developed and well-nourished. No distress.  HENT:  Head: Atraumatic.  Mouth/Throat: Oropharynx is clear and moist.  Eyes: Conjunctivae are normal. Pupils are equal, round, and reactive to light.  Neck: Neck supple. No tracheal deviation present. No thyromegaly present.  No bruits  Cardiovascular: Normal rate, regular rhythm, normal heart sounds and intact distal pulses.  Exam reveals no gallop and no friction rub.   No murmur heard. Pulmonary/Chest: Effort normal and breath sounds normal. No accessory muscle usage. No respiratory distress.  Abdominal: Soft. Bowel sounds are normal. He exhibits no distension. There is no tenderness.  Genitourinary:  No cva  tenderness  Musculoskeletal: Normal range of motion.  CTLS spine, non tender, aligned, no step off.   Neurological: He is alert and oriented to person, place, and time. No cranial nerve deficit.  No facial asymmetry or weakness. Motor intact bil. stre 5/5. No pronator drift. sens grossly intact bil.   Skin: Skin is warm and dry. No rash noted. He is not diaphoretic.  Psychiatric: He has a normal mood and affect.  Nursing note and vitals reviewed.   ED Course  Procedures (including critical care time) Labs Review  Results for orders placed or performed during the hospital encounter of 12/02/15  CBC  Result Value Ref Range   WBC 7.4 4.0 - 10.5 K/uL   RBC 5.07 4.22 - 5.81 MIL/uL   Hemoglobin 15.8 13.0 - 17.0 g/dL   HCT 46.0 39.0 - 52.0 %   MCV 90.7 78.0 - 100.0 fL   MCH 31.2 26.0 - 34.0 pg   MCHC 34.3 30.0 - 36.0 g/dL   RDW 13.0 11.5 - 15.5 %   Platelets 220 150 - 400 K/uL  Comprehensive metabolic panel  Result Value Ref Range   Sodium 144 135 - 145 mmol/L   Potassium 3.2 (L) 3.5 - 5.1 mmol/L   Chloride 106 101 - 111 mmol/L   CO2 28 22 - 32 mmol/L   Glucose, Bld 100 (H) 65 - 99 mg/dL   BUN 16 6 - 20 mg/dL   Creatinine, Ser 0.80 0.61 - 1.24 mg/dL   Calcium 9.1 8.9 - 10.3 mg/dL   Total Protein 6.8 6.5 - 8.1 g/dL   Albumin 4.0 3.5 - 5.0 g/dL   AST 20 15 - 41 U/L   ALT 18 17 - 63 U/L   Alkaline Phosphatase 92 38 - 126 U/L   Total Bilirubin 0.6 0.3 - 1.2 mg/dL   GFR calc non Af Amer >60 >60 mL/min   GFR calc Af Amer >60 >60 mL/min   Anion gap 10 5 - 15  Urinalysis, Routine w reflex microscopic (not at [facility])  Result Value Ref Range   Color, Urine YELLOW YELLOW   APPearance CLOUDY (A) CLEAR   Specific Gravity, Urine 1.017 1.005 - 1.030   pH 6.5 5.0 - 8.0   Glucose, UA NEGATIVE NEGATIVE mg/dL   Hgb urine dipstick NEGATIVE NEGATIVE   Bilirubin Urine NEGATIVE NEGATIVE  Ketones, ur NEGATIVE NEGATIVE mg/dL   Protein, ur NEGATIVE NEGATIVE mg/dL   Nitrite NEGATIVE NEGATIVE    Leukocytes, UA SMALL (A) NEGATIVE  Troponin I  Result Value Ref Range   Troponin I <0.03 <0.031 ng/mL  Urine microscopic-add on  Result Value Ref Range   Squamous Epithelial / LPF 0-5 (A) NONE SEEN   WBC, UA 0-5 0 - 5 WBC/hpf   RBC / HPF 0-5 0 - 5 RBC/hpf   Bacteria, UA FEW (A) NONE SEEN   Ct Head Wo Contrast  12/02/2015  CLINICAL DATA:  Right facial droop, ataxia, slow speech. EXAM: CT HEAD WITHOUT CONTRAST TECHNIQUE: Contiguous axial images were obtained from the base of the skull through the vertex without intravenous contrast. COMPARISON:  CT and MRI scans of November 23, 2014. FINDINGS: Bony calvarium appears intact. Mild chronic ischemic white matter disease is noted. Mild diffuse cortical atrophy is noted. No mass effect or midline shift is noted. Ventricular size is within normal limits. There is no evidence of mass lesion, hemorrhage or acute infarction. IMPRESSION: Mild chronic ischemic white matter disease. Mild diffuse cortical atrophy. No acute intracranial abnormality seen. Electronically Signed   By: Marijo Conception, M.D.   On: 12/02/2015 10:14   Mr Brain Wo Contrast  12/02/2015  CLINICAL DATA:  69 year old male with abnormal speech, ataxia, cognitive difficulties since 11/27/2015. Syncope and fall on Christmas day. Initial encounter. EXAM: MRI HEAD WITHOUT CONTRAST TECHNIQUE: Multiplanar, multiecho pulse sequences of the brain and surrounding structures were obtained without intravenous contrast. COMPARISON:  Head CT without contrast 0946 hours today. Brain MRI 11/23/2014. FINDINGS: Major intracranial vascular flow voids are stable. Dominant and tortuous distal left vertebral artery re- demonstrated. Stable cerebral volume since 2015. Noisy diffusion-weighted imaging. Questionable mild linear trace diffusion abnormality in the left paracentral pons seen on series 4, image 18. However, this is not correlated on coronal images, and there is no associated T2 or FLAIR hyperintensity.  Elsewhere no convincing restricted diffusion or evidence of acute infarction. No midline shift, mass effect, evidence of mass lesion, ventriculomegaly, extra-axial collection or acute intracranial hemorrhage. Cervicomedullary junction and pituitary are within normal limits. Negative visualized cervical spine. [place] Cables and white matter signal is stable and normal for age. No cortical encephalomalacia or chronic cerebral blood products. Visible internal auditory structures appear normal. Mastoids are clear. Paranasal sinuses are clear. Negative orbit and scalp soft tissues; right scalp probable sebaceous cyst unchanged. Normal bone marrow signal. IMPRESSION: 1. Questionable mild linear diffusion abnormality in the left paracentral pons seen only on axial diffusion and not correlated on other sequences. Favor artifact unless the patient has new right side weakness. 2. Otherwise stable and negative for age noncontrast MRI appearance of the brain. Electronically Signed   By: Genevie Ann M.D.   On: 12/02/2015 11:43      I have personally reviewed and evaluated these images and lab results as part of my medical decision-making.   EKG Interpretation   Date/Time:  Thursday December 02 2015 13:47:24 EST Ventricular Rate:  66 PR Interval:  178 QRS Duration: 91 QT Interval:  400 QTC Calculation: 419 R Axis:   45 Text Interpretation:  Sinus rhythm No significant change since last  tracing Confirmed by Ashok Cordia  MD, Lennette Bihari ([id number]) on 12/02/2015 1:58:04 PM      MDM   Iv ns. Labs. Ct.  Reviewed nursing notes and prior charts for additional history.   Ct/mri noted.  Pt unable to ambulate on own, requires 2 person assist  or falls back.  Neurology consulted, Dr Aram Beecham, discussed pt and mri - he indicates admit to med service, they will consult.  Recheck pt, no change.   Hospitalists paged for admission.  k mildly low, kcl po.     Lajean Saver, MD 12/02/15 [phone]

## 2015-12-02 NOTE — ED Notes (Signed)
MD at bedside. 

## 2015-12-02 NOTE — ED Notes (Signed)
Patient transported to CT 

## 2015-12-02 NOTE — ED Notes (Signed)
Patient was a full 2 person assist when attempting to ambulate. Patient was able to stand with assistance and take a few small steps, but was still unsteady and required both assists to successfully get back in the bed.

## 2015-12-02 NOTE — Progress Notes (Signed)
CM spoke with Santiago Glad of [facility] to discuss following pt for d/c needs  Discussed pt/wife interest in hospital bed and w/c and if home services seeing if [facility] [place] ? O[place] would be able to see him Pt saw this

## 2015-12-02 NOTE — Consult Note (Signed)
NEURO HOSPITALIST CONSULT NOTE   Referring physician: [facility] Reason for Consult: unable to ambulate, new onset  HPI:                                                                                                                                          [name] is an 69 y.o. male with a past medical history significant for HLD, HTN, bipolar disorder, stroke in 2011 without residual deficits, schizophrenia, B12 deficiency,  and Parkinson disease, admitted to the hospital due to new onset inability to walk. Wife is at the bedside, helps to supplement the history, and indicated that [name] was diagnosed with PD at least 1 year ago, started on Levodopa TID " but we never saw a great benefit of that pill". She said that her husband was able to able without help but has been experiencing a progressive decline in her motor skills that had worsened in the past week to the point that since 12/24 hasn't been able to stand up and walk without support. Patient reports that he can not function anymore and his gait had drmatically deteriorated. He and his wife concur that getting up from a low chair, rolling over in bed or turning around when he is walking are daunting tasks.  He said that his instability and pain in his lower back and legs have also increased lately. Speech has also worsened. They expressed no concerns with short term memory, behavioral, mood, or personality changes.  Regarding his gait, wife said that the main issue is that " he gets up, walks with very short steps and then stops, remains in the same spot and can not go forward despite making a great effort, and starts falling backwards". Had a fall on christmas day. Patient said that he get " a little bit dizzy" shortly after standing up. No HA, double vision, difficulty swallowing solids or liquids, focal weakness or numbness. Importantly, wife said that she stopped taking Levodopa for approximately 1 months and  resumed treatment just 2 or 3 weeks ago. He had home PT during the summer which they report as being really helpful. Has been on Levodopa 25/100 mg TID without noticeable side effects or recent dose adjustment. MRI brain was personally reviewed and showed questionable mild linear diffusion abnormality in the left paracentral pons seen only on axial diffusion and not correlated on other sequences. Labs reviewed and reveal no significant metabolic derangements or infection.   Past Medical History  Diagnosis Date  . Hyperlipidemia   . Vitamin B12 deficiency   . Occasional tremors     leg tremors  . History of CVA (cerebrovascular accident) without residual deficits     2011  . Other degenerative diseases of the basal ganglia     S/P  CVA 2011--  CHRONIC LEFT BASAL GANGLIA LACUNAR INFARCT  PER CT  . History of nonmelanoma skin cancer     EXCISION OF NOSE  . Hypertension   . Schizophrenia ([place])     HX PSYCHIATRIC ADMISSION'S  AND ECT ([place]) [place]  . Bipolar affective disorder ([place])   . ED (erectile dysfunction) of organic origin   . Dementia   . Depression   . Generalized weakness     LOWER EXTREMITIES  . Chronic anxiety   . Urinary bladder stone   . History of bladder stone   . BPH (benign prostatic hyperplasia)   . Lower urinary tract symptoms (LUTS)   . Elevated PSA   . History of acute pyelonephritis     admission 07-19-2015  . Chronic constipation     Past Surgical History  Procedure Laterality Date  . Transanal excision anal and rectal polyps  11-02-2000  . Transthoracic echocardiogram  08-16-2014    Mild LVH/ grade I diastolic dysfunction/  ef 60-65%/  mild AV calification without stenosis/  mild RAE  . Cardiovascular stress test  06-18-2013  DR Jenkins [place]    LOW RISK NO EXERCISE LEXISCAN STUDY/  NO ISCHEMIA, CANNOT RULE OUT BASAL INFERIOR INFARCTION BUT POSSIBLE ARTIFACT GIVEN NORMAL WALL MOTION/  EF 73%  . Excision tumor upper left thigh  2012  . Transurethral  incision of prostate N/A 10/03/2013    Procedure: TRANSURETHRAL INCISION OF THE PROSTATE (TUIP);  Surgeon: Claybon Jabs, MD;  Location: [facility];  Service: Urology;  Laterality: N/A;  . Cystoscopy with litholapaxy N/A 10/03/2013    Procedure: CYSTOSCOPY WITH LITHOLAPAXY;  Surgeon: Claybon Jabs, MD;  Location: [facility];  Service: Urology;  Laterality: N/A;  . Holmium laser application N/A 46/27/0350    Procedure: HOLMIUM LASER APPLICATION;  Surgeon: Claybon Jabs, MD;  Location: [facility];  Service: Urology;  Laterality: N/A;  . Cystoscopy with litholapaxy N/A 08/13/2015    Procedure: CYSTOSCOPY WITH LITHOLAPAXY;  Surgeon: Kathie Rhodes, MD;  Location: [facility];  Service: Urology;  Laterality: N/A;  . Transurethral resection of prostate N/A 08/13/2015    Procedure: TRANSURETHRAL RESECTION OF THE PROSTATE (TURP);  Surgeon: Kathie Rhodes, MD;  Location: [facility];  Service: Urology;  Laterality: N/A;  . Holmium laser application N/A 0/08/3817    Procedure: HOLMIUM LASER APPLICATION;  Surgeon: Kathie Rhodes, MD;  Location: [facility];  Service: Urology;  Laterality: N/A;    Family History  Problem Relation Age of Onset  . Dementia Father   . Dementia Mother   . Colon cancer Neg Hx   . Colon polyps Neg Hx   . Rectal cancer Neg Hx   . Stomach cancer Neg Hx     Family History: no brain tumor, MS, or epilepsy  Social History:  reports that he has been smoking Cigarettes.  He has a 4.8 pack-year smoking history. He has never used smokeless tobacco. He reports that he does not drink alcohol or use illicit drugs.  No Known Allergies  MEDICATIONS:  Scheduled: .  stroke: mapping our early stages of recovery book   Does not apply Once  . amLODipine  5 mg Oral Daily  . aspirin  300 mg  Rectal Daily   Or  . aspirin  325 mg Oral Daily  . carbidopa-levodopa  1 tablet Oral TID  . enoxaparin (LOVENOX) injection  40 mg Subcutaneous Q24H  . mirtazapine  15 mg Oral QHS  . omega-3 acid ethyl esters  3 g Oral Daily  . pantoprazole  40 mg Oral Daily  . saccharomyces boulardii  250 mg Oral BID  . vitamin B-12  1,000 mcg Oral Daily     ROS:                                                                                                                                       History obtained from chart review and the patient  General ROS: negative for - chills, fever, night sweats, weight gain or weight loss Psychological ROS: negative for - behavioral disorder, hallucinations, memory difficulties, mood swings or suicidal ideation Ophthalmic ROS: negative for - double vision, eye pain or loss of vision ENT ROS: negative for - epistaxis, nasal discharge, oral lesions, sore throat, tinnitus or vertigo Allergy and Immunology ROS: negative for - hives or itchy/watery eyes Hematological and Lymphatic ROS: negative for - bleeding problems, bruising or swollen lymph nodes Endocrine ROS: negative for - galactorrhea, hair pattern changes, polydipsia/polyuria or temperature intolerance Respiratory ROS: negative for - cough, hemoptysis, shortness of breath or wheezing Cardiovascular ROS: negative for - chest pain, dyspnea on exertion, edema or irregular heartbeat Gastrointestinal ROS: negative for - abdominal pain, diarrhea, hematemesis, nausea/vomiting or stool incontinence Genito-Urinary ROS: negative for - dysuria, hematuria, or incontinence. Reports increased urinary frequency Musculoskeletal ROS: negative for - joint swelling Neurological ROS: as noted in HPI Dermatological ROS: negative for rash and skin lesion changes  Physical exam:  Constitutional: well developed, pleasant male in no apparent distress. Blood pressure 175/103, pulse 73, temperature 97.6 F (36.4 C), temperature  source Oral, resp. rate 14, height 6' 2"  (1.88 m), weight 90.266 kg (199 lb), SpO2 96 %. Eyes: no jaundice or exophthalmos.  Head: normocephalic. Neck: supple, no bruits, no JVD. Cardiac: no murmurs. Lungs: clear. Abdomen: soft, no tender, no mass. Extremities: no edema, clubbing, or cyanosis.  Skin: no rash  Neurologic Examination:  General: NAD Mental Status: Alert, oriented, thought content appropriate. Hypophonia without frank dysarthria or aphasia . Able to follow 3 step commands without difficulty. Cranial Nerves: II: Discs flat bilaterally; Visual fields grossly normal, pupils equal, round, reactive to light and accommodation III,IV, VI: ptosis not present, extra-ocular motions intact bilaterally V,VII: smile symmetric, facial light touch sensation normal bilaterally VIII: hearing normal bilaterally IX,X: uvula rises symmetrically XI: bilateral shoulder shrug XII: midline tongue extension without atrophy or fasciculations Motor: Moves all limbs symmetrically Tone: significant body rigidity with cogwheeling Sensory: Pinprick and light touch intact throughout, bilaterally Deep Tendon Reflexes:  Brisk all over Plantars: Right: downgoing   Left: downgoing Cerebellar: normal finger-to-nose,  normal heel-to-shin test. Resting tremor right leg. Gait:  No ataxia but marked gait impairment resulting from bradykinesia with postural instability. Did not demonstrate freezing of gait during my assessment.    Lab Results  Component Value Date/Time   CHOL 177 06/10/2015 01:38 PM    Results for orders placed or performed during the hospital encounter of 12/02/15 (from the past 48 hour(s))  CBC     Status: None   Collection Time: 12/02/15  9:20 AM  Result Value Ref Range   WBC 7.4 4.0 - 10.5 K/uL   RBC 5.07 4.22 - 5.81 MIL/uL   Hemoglobin 15.8 13.0 - 17.0 g/dL   HCT 46.0 39.0 -  52.0 %   MCV 90.7 78.0 - 100.0 fL   MCH 31.2 26.0 - 34.0 pg   MCHC 34.3 30.0 - 36.0 g/dL   RDW 13.0 11.5 - 15.5 %   Platelets 220 150 - 400 K/uL  Comprehensive metabolic panel     Status: Abnormal   Collection Time: 12/02/15  9:20 AM  Result Value Ref Range   Sodium 144 135 - 145 mmol/L   Potassium 3.2 (L) 3.5 - 5.1 mmol/L   Chloride 106 101 - 111 mmol/L   CO2 28 22 - 32 mmol/L   Glucose, Bld 100 (H) 65 - 99 mg/dL   BUN 16 6 - 20 mg/dL   Creatinine, Ser 0.80 0.61 - 1.24 mg/dL   Calcium 9.1 8.9 - 10.3 mg/dL   Total Protein 6.8 6.5 - 8.1 g/dL   Albumin 4.0 3.5 - 5.0 g/dL   AST 20 15 - 41 U/L   ALT 18 17 - 63 U/L   Alkaline Phosphatase 92 38 - 126 U/L   Total Bilirubin 0.6 0.3 - 1.2 mg/dL   GFR calc non Af Amer >60 >60 mL/min   GFR calc Af Amer >60 >60 mL/min    Comment: (NOTE) The eGFR has been calculated using the CKD EPI equation. This calculation has not been validated in all clinical situations. eGFR's persistently <60 mL/min signify possible Chronic Kidney Disease.    Anion gap 10 5 - 15  Troponin I     Status: None   Collection Time: 12/02/15  9:20 AM  Result Value Ref Range   Troponin I <0.03 <0.031 ng/mL    Comment:        NO INDICATION OF MYOCARDIAL INJURY.   Urinalysis, Routine w reflex microscopic (not at [facility])     Status: Abnormal   Collection Time: 12/02/15 10:54 AM  Result Value Ref Range   Color, Urine YELLOW YELLOW   APPearance CLOUDY (A) CLEAR   Specific Gravity, Urine 1.017 1.005 - 1.030   pH 6.5 5.0 - 8.0   Glucose, UA NEGATIVE NEGATIVE mg/dL   Hgb urine dipstick NEGATIVE NEGATIVE   Bilirubin Urine  NEGATIVE NEGATIVE   Ketones, ur NEGATIVE NEGATIVE mg/dL   Protein, ur NEGATIVE NEGATIVE mg/dL   Nitrite NEGATIVE NEGATIVE   Leukocytes, UA SMALL (A) NEGATIVE  Urine microscopic-add on     Status: Abnormal   Collection Time: 12/02/15 10:54 AM  Result Value Ref Range   Squamous Epithelial / LPF 0-5 (A) NONE SEEN   WBC, UA 0-5 0 - 5 WBC/hpf   RBC /  HPF 0-5 0 - 5 RBC/hpf   Bacteria, UA FEW (A) NONE SEEN    Ct Head Wo Contrast  12/02/2015  CLINICAL DATA:  Right facial droop, ataxia, slow speech. EXAM: CT HEAD WITHOUT CONTRAST TECHNIQUE: Contiguous axial images were obtained from the base of the skull through the vertex without intravenous contrast. COMPARISON:  CT and MRI scans of November 23, 2014. FINDINGS: Bony calvarium appears intact. Mild chronic ischemic white matter disease is noted. Mild diffuse cortical atrophy is noted. No mass effect or midline shift is noted. Ventricular size is within normal limits. There is no evidence of mass lesion, hemorrhage or acute infarction. IMPRESSION: Mild chronic ischemic white matter disease. Mild diffuse cortical atrophy. No acute intracranial abnormality seen. Electronically Signed   By: Marijo Conception, M.D.   On: 12/02/2015 10:14   Dennis Brain Wo Contrast  12/02/2015  CLINICAL DATA:  69 year old male with abnormal speech, ataxia, cognitive difficulties since 11/27/2015. Syncope and fall on Christmas day. Initial encounter. EXAM: MRI HEAD WITHOUT CONTRAST TECHNIQUE: Multiplanar, multiecho pulse sequences of the brain and surrounding structures were obtained without intravenous contrast. COMPARISON:  Head CT without contrast 0946 hours today. Brain MRI 11/23/2014. FINDINGS: Major intracranial vascular flow voids are stable. Dominant and tortuous distal left vertebral artery re- demonstrated. Stable cerebral volume since 2015. Noisy diffusion-weighted imaging. Questionable mild linear trace diffusion abnormality in the left paracentral pons seen on series 4, image 18. However, this is not correlated on coronal images, and there is no associated T2 or FLAIR hyperintensity. Elsewhere no convincing restricted diffusion or evidence of acute infarction. No midline shift, mass effect, evidence of mass lesion, ventriculomegaly, extra-axial collection or acute intracranial hemorrhage. Cervicomedullary junction and  pituitary are within normal limits. Negative visualized cervical spine. [place] Cables and white matter signal is stable and normal for age. No cortical encephalomalacia or chronic cerebral blood products. Visible internal auditory structures appear normal. Mastoids are clear. Paranasal sinuses are clear. Negative orbit and scalp soft tissues; right scalp probable sebaceous cyst unchanged. Normal bone marrow signal. IMPRESSION: 1. Questionable mild linear diffusion abnormality in the left paracentral pons seen only on axial diffusion and not correlated on other sequences. Favor artifact unless the patient has new right side weakness. 2. Otherwise stable and negative for age noncontrast MRI appearance of the brain. Electronically Signed   By: Genevie Ann M.D.   On: 12/02/2015 11:43   Assessment/Plan: 69 y/o with multiple medical problems including PD with progressive gait impairment, admitted due to new onset inability to walk. Marked bradykinesia, increased body rigidity, with profound postural instability on exam.  Suspect freezing of gait is a major contributor to patient falls. No obvious infection or metabolic derangements. MRI reveals questionable mild linear diffusion abnormality in the left paracentral pons that I don't think explains patient current presentation. Patient seems to have progression of underlying PD and his current dose of Levodopa is obviously suboptimal at this moment..  Recommend: 1) Start adjusting Levodopa (25/100 mg every 4 hours) with subsequent adjustments as outpatient. 2) Agressive PT with gait training 3) Follow  up with movement disorder specialist at [facility].   Dorian Pod, MD 12/02/2015, 4:21 PM  [facility]

## 2015-12-02 NOTE — ED Notes (Signed)
Pt c/o ataxia and slow speech and slow cognition onset 11-27-15, right eye droop followed by syncope and fall on 11-28-15. No anticoagulants, no confusion, no new symptoms since 11/28/15. A & O x 4.

## 2015-12-02 NOTE — H&P (Signed)
[facility] History and Physical  [name] [id number] DOB: [birth year] DOA: 12/02/2015  69 y/o ?  Recent diarrheal illnesses rx flagyll Known h/o Parkinson's on sinemet since 11/2014 Bipolar/dementia/depression Chr pain on tramadol Htn Prior CVA 2011 GERd Prior NSVT BPH with Bladder stones s/p TURP + Cystolithalopexy 08/2015   he is a poor historian and tangential so wife supplements h/o  Wife relates was doing fair until 12/24 when he seemed to have more diffculty walking He seemed to "fall backwards"and was more unsteady on his feet and had a fall at home He did not black out  Wife out of concern for CVa took him to PCP who directed him to ED for work-up and evaluation   No cp No n No dark stool  No tarry stool No ,chill,rigor No cough No cold No dysuria   Past Medical History  Diagnosis Date  . Hyperlipidemia   . Vitamin B12 deficiency   . Occasional tremors     leg tremors  . History of CVA (cerebrovascular accident) without residual deficits     2011  . Other degenerative diseases of the basal ganglia     S/P CVA 2011--  CHRONIC LEFT BASAL GANGLIA LACUNAR INFARCT  PER CT  . History of nonmelanoma skin cancer     EXCISION OF NOSE  . Hypertension   . Schizophrenia ([place])     HX PSYCHIATRIC ADMISSION'S  AND ECT ([place]) [place]  . Bipolar affective disorder ([place])   . ED (erectile dysfunction) of organic origin   . Dementia   . Depression   . Generalized weakness     LOWER EXTREMITIES  . Chronic anxiety   . Urinary bladder stone   . History of bladder stone   . BPH (benign prostatic hyperplasia)   . Lower urinary tract symptoms (LUTS)   . Elevated PSA   . History of acute pyelonephritis     admission 07-19-2015  . Chronic constipation    Past Surgical History  Procedure Laterality Date  . Transanal excision anal and rectal polyps  11-02-2000  . Transthoracic echocardiogram  08-16-2014    Mild LVH/ grade I diastolic dysfunction/  ef  60-65%/  mild AV calification without stenosis/  mild RAE  . Cardiovascular stress test  06-18-2013  DR Jenkins [place]    LOW RISK NO EXERCISE LEXISCAN STUDY/  NO ISCHEMIA, CANNOT RULE OUT BASAL INFERIOR INFARCTION BUT POSSIBLE ARTIFACT GIVEN NORMAL WALL MOTION/  EF 73%  . Excision tumor upper left thigh  2012  . Transurethral incision of prostate N/A 10/03/2013    Procedure: TRANSURETHRAL INCISION OF THE PROSTATE (TUIP);  Surgeon: Claybon Jabs, MD;  Location: [facility];  Service: Urology;  Laterality: N/A;  . Cystoscopy with litholapaxy N/A 10/03/2013    Procedure: CYSTOSCOPY WITH LITHOLAPAXY;  Surgeon: Claybon Jabs, MD;  Location[facility];  Service: Urology;  Laterality: N/A;  . Holmium laser application N/A [id number]    Procedure: HOLMIUM LASER APPLICATION;  Surgeon: Claybon Jabs, MD;  Location: [facility];  Service: Urology;  Laterality: N/A;  . Cystoscopy with litholapaxy N/A 08/13/2015    Procedure: CYSTOSCOPY WITH LITHOLAPAXY;  Surgeon: Kathie Rhodes, MD;  Location: [facility];  Service: Urology;  Laterality: N/A;  . Transurethral resection of prostate N/A 08/13/2015    Procedure: TRANSURETHRAL RESECTION OF THE PROSTATE (TURP);  Surgeon: Kathie Rhodes, MD;  Location: [facility];  Service: Urology;  Laterality: N/A;  .  Holmium laser application N/A [id number]    Procedure: HOLMIUM LASER APPLICATION;  Surgeon: Kathie Rhodes, MD;  Location: [facility];  Service: Urology;  Laterality: N/A;   Social History:  Social History   Social History Narrative   Divorced, remarried.  Disabled secondary to psych reasons.    No Known Allergies  Family History  Problem Relation Age of Onset  . Dementia Father   . Dementia Mother   . Colon cancer Neg Hx   . Colon polyps Neg Hx   . Rectal cancer Neg Hx   . Stomach cancer Neg Hx     Prior to Admission medications   Medication Sig Start Date End Date  Taking? Authorizing Provider  ALPRAZolam Duanne Moron) 0.5 MG tablet Take 1 tablet (0.5 mg total) by mouth 3 (three) times daily as needed. for anxiety 11/19/15  Yes Binnie Rail, MD  amLODipine (NORVASC) 5 MG tablet Take 5 mg by mouth daily.   Yes Historical Provider, MD  Bisacodyl (DULCOLAX PO) Take 3 tablets by mouth at bedtime as needed (const).    Yes Historical Provider, MD  carbidopa-levodopa (SINEMET IR) 25-100 MG per tablet Take 1 tablet by mouth 3 (three) times daily. 06/09/15  Yes Biagio Borg, MD  Flaxseed, Linseed, (FLAXSEED OIL PO) Take 2 capsules by mouth daily.   Yes Historical Provider, MD  ibuprofen (ADVIL,MOTRIN) 200 MG tablet Take 200-400 mg by mouth every 6 (six) hours as needed for headache or moderate pain.    Yes Historical Provider, MD  mirtazapine (REMERON) 15 MG tablet Take 1 tablet (15 mg total) by mouth at bedtime. 06/09/15  Yes Biagio Borg, MD  omega-3 acid ethyl esters (LOVAZA) 1 G capsule Take 3 g by mouth daily.   Yes Historical Provider, MD  pantoprazole (PROTONIX) 40 MG tablet Take 1 tablet (40 mg total) by mouth daily. Patient taking differently: Take 40 mg by mouth every morning.  06/09/15  Yes Biagio Borg, MD  prochlorperazine (COMPAZINE) 5 MG tablet Take 1 tab every 6-8 hours as needed for nausea. 10/25/15  Yes Amy S Esterwood, PA-C  saccharomyces boulardii (FLORASTOR) 250 MG capsule Take 1 capsule (250 mg total) by mouth 2 (two) times daily. 10/25/15  Yes Amy S Esterwood, PA-C  traMADol (ULTRAM) 50 MG tablet Take 50 mg by mouth every 6 (six) hours as needed for moderate pain.   Yes Historical Provider, MD  vitamin B-12 1000 MCG tablet Take 1 tablet (1,000 mcg total) by mouth daily. 08/17/14  Yes Janece Canterbury, MD  amLODipine (NORVASC) 10 MG tablet Take 1 tablet (10 mg total) by mouth daily. Patient not taking: Reported on 12/02/2015 11/25/14   Reyne Dumas, MD  metroNIDAZOLE (FLAGYL) 250 MG tablet Take 1 tablet (250 mg total) by mouth 4 (four) times daily. Patient  not taking: Reported on 12/02/2015 10/25/15   Amy S Esterwood, PA-C  ondansetron (ZOFRAN) 4 MG tablet Take 1 tablet (4 mg total) by mouth every 6 (six) hours as needed for nausea. Patient not taking: Reported on 12/02/2015 07/22/15   Nita Sells, MD   Physical Exam: Filed Vitals:   12/02/15 1200 12/02/15 1212 12/02/15 1351 12/02/15 1504  BP: 141/85 150/94 151/106 188/98  Pulse: 66 67 68 69  Temp:  98.4 F (36.9 C) 98.8 F (37.1 C) 97.6 F (36.4 C)  TempSrc:  Oral Oral Oral  Resp: 16 12 14 16   Height:      Weight:      SpO2: 96% 97% 97%  97%    eomi ncat  s1s2 no m/r/g Clear no added sound Abd soft nt nd no rebound No le edema Finger-n-finger slow but intact Power 5/5, sensory grossly intact Speech slow but clear No facial droop Slight tremor   Labs on Admission:  Basic Metabolic Panel:  Recent Labs Lab 12/02/15 0920  NA 144  K 3.2*  CL 106  CO2 28  GLUCOSE 100*  BUN 16  CREATININE 0.80  CALCIUM 9.1   Liver Function Tests:  Recent Labs Lab 12/02/15 0920  AST 20  ALT 18  ALKPHOS 92  BILITOT 0.6  PROT 6.8  ALBUMIN 4.0   No results for input(s): LIPASE, AMYLASE in the last 168 hours. No results for input(s): AMMONIA in the last 168 hours. CBC:  Recent Labs Lab 12/02/15 0920  WBC 7.4  HGB 15.8  HCT 46.0  MCV 90.7  PLT 220   Cardiac Enzymes:  Recent Labs Lab 12/02/15 0920  TROPONINI <0.03    BNP (last 3 results)  Recent Labs  07/19/15 2138  BNP 42.1    ProBNP (last 3 results) No results for input(s): PROBNP in the last 8760 hours.  CBG: No results for input(s): GLUCAP in the last 168 hours.  Radiological Exams on Admission: Ct Head Wo Contrast  12/02/2015  CLINICAL DATA:  Right facial droop, ataxia, slow speech. EXAM: CT HEAD WITHOUT CONTRAST TECHNIQUE: Contiguous axial images were obtained from the base of the skull through the vertex without intravenous contrast. COMPARISON:  CT and MRI scans of November 23, 2014.  FINDINGS: Bony calvarium appears intact. Mild chronic ischemic white matter disease is noted. Mild diffuse cortical atrophy is noted. No mass effect or midline shift is noted. Ventricular size is within normal limits. There is no evidence of mass lesion, hemorrhage or acute infarction. IMPRESSION: Mild chronic ischemic white matter disease. Mild diffuse cortical atrophy. No acute intracranial abnormality seen. Electronically Signed   By: Marijo Conception, M.D.   On: 12/02/2015 10:14   Mr Brain Wo Contrast  12/02/2015  CLINICAL DATA:  69 year old male with abnormal speech, ataxia, cognitive difficulties since 11/27/2015. Syncope and fall on Christmas day. Initial encounter. EXAM: MRI HEAD WITHOUT CONTRAST TECHNIQUE: Multiplanar, multiecho pulse sequences of the brain and surrounding structures were obtained without intravenous contrast. COMPARISON:  Head CT without contrast 0946 hours today. Brain MRI 11/23/2014. FINDINGS: Major intracranial vascular flow voids are stable. Dominant and tortuous distal left vertebral artery re- demonstrated. Stable cerebral volume since 2015. Noisy diffusion-weighted imaging. Questionable mild linear trace diffusion abnormality in the left paracentral pons seen on series 4, image 18. However, this is not correlated on coronal images, and there is no associated T2 or FLAIR hyperintensity. Elsewhere no convincing restricted diffusion or evidence of acute infarction. No midline shift, mass effect, evidence of mass lesion, ventriculomegaly, extra-axial collection or acute intracranial hemorrhage. Cervicomedullary junction and pituitary are within normal limits. Negative visualized cervical spine. Pear[place] Cables and white matter signal is stable and normal for age. No cortical encephalomalacia or chronic cerebral blood products. Visible internal auditory structures appear normal. Mastoids are clear. Paranasal sinuses are clear. Negative orbit and scalp soft tissues; right scalp probable  sebaceous cyst unchanged. Normal bone marrow signal. IMPRESSION: 1. Questionable mild linear diffusion abnormality in the left paracentral pons seen only on axial diffusion and not correlated on other sequences. Favor artifact unless the patient has new right side weakness. 2. Otherwise stable and negative for age noncontrast MRI appearance of the brain. Electronically Signed  By: Genevie Ann M.D.   On: 12/02/2015 11:43    EKG: Independently reviewed. No acute findings  Assessment/Plan  Rule out CVA -Admit under stroke pathway complete workup with carotid Dopplers neurology input pending No focal deficit per my exam Home health physical therapy occupational therapy to see the patient and make recommendations Family does not wish skilled nursing placement and will take him home on discharge  Parkinson's disease Wife says he's been taking his meds regularly and has been doing fair and that's why they never got follow-up. I still believe that patient should be seen by neurologist up titrate meds versus not as I believe this may be symptom of his Parkinson's. 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 Outside of any window for stroke so he'll resume a to restart the same  Depression/anxiety-continue alprazolam 1 tab 3 times a day when necessary  Nausea vomiting continue Compazine 5 mg every 6-8 as needed  Discussed with family at bedside wife Full CODE STATUS Admit to telemetry under status     Verneita Griffes, MD [facility] [phone]

## 2015-12-02 NOTE — ED Notes (Signed)
Per wife, pt has slurred speech, increased weakness-generalized, headache, urinary symptoms,confusion. This is not his normal. EDP made aware

## 2015-12-03 ENCOUNTER — Observation Stay (HOSPITAL_BASED_OUTPATIENT_CLINIC_OR_DEPARTMENT_OTHER): Payer: Medicare Other

## 2015-12-03 ENCOUNTER — Ambulatory Visit (HOSPITAL_BASED_OUTPATIENT_CLINIC_OR_DEPARTMENT_OTHER): Payer: Medicare Other

## 2015-12-03 DIAGNOSIS — R269 Unspecified abnormalities of gait and mobility: Secondary | ICD-10-CM

## 2015-12-03 DIAGNOSIS — G20 Parkinson's disease: Secondary | ICD-10-CM | POA: Diagnosis not present

## 2015-12-03 DIAGNOSIS — G20A1 Parkinson's disease without dyskinesia, without mention of fluctuations: Secondary | ICD-10-CM | POA: Insufficient documentation

## 2015-12-03 DIAGNOSIS — G459 Transient cerebral ischemic attack, unspecified: Secondary | ICD-10-CM | POA: Diagnosis not present

## 2015-12-03 DIAGNOSIS — R2681 Unsteadiness on feet: Secondary | ICD-10-CM | POA: Diagnosis not present

## 2015-12-03 DIAGNOSIS — E876 Hypokalemia: Secondary | ICD-10-CM | POA: Diagnosis not present

## 2015-12-03 LAB — LIPID PANEL
CHOL/HDL RATIO: 4.7 ratio
CHOLESTEROL: 159 mg/dL (ref 0–200)
HDL: 34 mg/dL — AB (ref >40)
LDL Cholesterol: 107 mg/dL — ABNORMAL HIGH (ref 0–99)
TRIGLYCERIDES: 90 mg/dL (ref <150)
VLDL: 18 mg/dL (ref 0–40)

## 2015-12-03 LAB — URINE CULTURE: Culture: NO GROWTH

## 2015-12-03 MED ORDER — POTASSIUM CHLORIDE CRYS ER 20 MEQ PO TBCR
40.0000 meq | EXTENDED_RELEASE_TABLET | Freq: Once | ORAL | Status: AC
  Administered 2015-12-03: 40 meq via ORAL
  Filled 2015-12-03: qty 2

## 2015-12-03 MED ORDER — ENSURE ENLIVE PO LIQD: 237.0000 mL | Freq: Two times a day (BID) | ORAL | Status: DC

## 2015-12-03 MED ORDER — CARBIDOPA-LEVODOPA 25-100 MG PO TABS: 1.0000 | ORAL_TABLET | Freq: Four times a day (QID) | ORAL | Status: DC

## 2015-12-03 NOTE — Progress Notes (Signed)
  Echocardiogram 2D Echocardiogram has been performed.  Jennette Dubin 12/03/2015, 10:29 AM

## 2015-12-03 NOTE — Discharge Instructions (Signed)
Parkinson Disease Parkinson disease is a disorder of the central nervous system, which includes the brain and spinal cord. A person with this disease slowly loses the ability to completely control body movements. Within the brain, there is a group of nerve cells (basal ganglia) that help control movement. The basal ganglia are damaged and do not work properly in a person with Parkinson disease. In addition, the basal ganglia produce and use a brain chemical called dopamine. The dopamine chemical sends messages to other parts of the body to control and coordinate body movements. Dopamine levels are low in a person with Parkinson disease. If the dopamine levels are low, then the body does not receive the correct messages it needs to move normally.  CAUSES  The exact reason why the basal ganglia get damaged is not known. Some medical researchers have thought that infection, genes, environment, and certain medicines may contribute to the cause.  SYMPTOMS   An early symptom of Parkinson disease is often an uncontrolled shaking (tremor) of the hands. The tremor will often disappear when the affected hand is consciously used.  As the disease progresses, walking, talking, getting out of a chair, and new movements become more difficult.  Muscles get stiff and movements become slower.  Balance and coordination become harder.  Depression, trouble swallowing, urinary problems, constipation, and sleep problems can occur.  Later in the disease, memory and thought processes may deteriorate. DIAGNOSIS  There are no specific tests to diagnose Parkinson disease. You may be referred to a neurologist for evaluation. Your caregiver will ask about your medical history, symptoms, and perform a physical exam. Blood tests and imaging tests of your brain may be performed to rule out other diseases. The imaging tests may include an MRI or a CT scan. TREATMENT  The goal of treatment is to relieve symptoms. Medicines may be  prescribed once the symptoms become troublesome. Medicine will not stop the progression of the disease, but medicine can make movement and balance better and help control tremors. Speech and occupational therapy may also be prescribed. Sometimes, surgical treatment of the brain can be done in young people. HOME CARE INSTRUCTIONS  Get regular exercise and rest periods during the day to help prevent exhaustion and depression.  If getting dressed becomes difficult, replace buttons and zippers with Velcro and elastic on your clothing.  Take all medicine as directed by your caregiver.  Install grab bars or railings in your home to prevent falls.  Go to speech or occupational therapy as directed.  Keep all follow-up visits as directed by your caregiver. SEEK MEDICAL CARE IF:  Your symptoms are not controlled with your medicine.  You fall.  You have trouble swallowing or choke on your food. MAKE SURE YOU:  Understand these instructions.  Will watch your condition.  Will get help right away if you are not doing well or get worse.   This information is not intended to replace advice given to you by your health care provider. Make sure you discuss any questions you have with your health care provider.   Document Released: 11/17/2000 Document Revised: 03/17/2013 Document Reviewed: 12/20/2011 Elsevier Interactive Patient Education 2016 Elsevier Inc.  

## 2015-12-03 NOTE — Progress Notes (Signed)
VASCULAR LAB PRELIMINARY  PRELIMINARY  PRELIMINARY  PRELIMINARY  Carotid duplex  completed.    Preliminary report:  Bilateral:  1-39% ICA stenosis.  Vertebral artery flow is antegrade.      Taliyah Watrous, RVT 12/03/2015, 9:16 AM

## 2015-12-03 NOTE — Evaluation (Signed)
SLP Cancellation Note  Patient Details Name: [name] MRN: [id number] DOB: Nov 07, 1946   Cancelled treatment:       Reason Eval/Treat Not Completed: SLP screened, no needs identified, will sign off (pt near baseline speech level per spouse, speech was mildly dysarthric but intelligible, advised pt and spouse to have pt speak loudly to keep laryngeal/pharyngeal/diaphragmatic muscles strong)   Janett Labella [place], [place] [facility] SLP [phone]

## 2015-12-03 NOTE — Evaluation (Signed)
Occupational Therapy Evaluation Patient Details Name: [name] MRN: [id number] DOB: 05/06/1946 Today's Date: 12/03/2015    History of Present Illness 69 y.o. male with a past medical history significant for HLD, HTN, bipolar disorder, stroke in 2011 without residual deficits, schizophrenia, B12 deficiency, and Parkinson disease, admitted to the hospital 12/02/15 due to new onset inability to walk.  Pt seen by Neurologist and adjustment of Sinemet recommended.   Clinical Impression   Pt was admitted for the above.  At baseline, wife assists pt as needed; he can sometimes perform ADLs and sometimes not.  Will follow in acute with min guard level goals for bathroom transfers.  He currently needs min to mod A.    Follow Up Recommendations  Supervision/Assistance - 24 hour    Equipment Recommendations   (wife will get a shower seat; she doesn't want 3:1)    Recommendations for Other Services       Precautions / Restrictions Precautions Precautions: Fall Restrictions Weight Bearing Restrictions: No      Mobility Bed Mobility Overal bed mobility: Needs Assistance Bed Mobility: Supine to Sit     Supine to sit: Min assist;HOB elevated Sit to supine: Min assist   General bed mobility comments: assist for LE's  Transfers Overall transfer level: Needs assistance Equipment used: 1 person hand held assist Transfers: Sit to/from Stand Sit to Stand: Min assist         General transfer comment: cues for safety; pt initially with posterior lean    Balance Overall balance assessment: Needs assistance;History of Falls         Standing balance support: Single extremity supported Standing balance-Leahy Scale: Poor Standing balance comment: requires at least one UE support                            ADL Overall ADL's : Needs assistance/impaired     Grooming: Wash/dry hands;Minimal assistance;Standing   Upper Body Bathing: Set up;Sitting   Lower Body  Bathing: Moderate assistance;Sit to/from stand   Upper Body Dressing : Minimal assistance;Sitting   Lower Body Dressing: Moderate assistance;Sit to/from stand   Toilet Transfer: Minimal assistance;Ambulation;Comfort height toilet;Grab bars       Tub/ Shower Transfer: Walk-in shower;Moderate assistance     General ADL Comments: simulated shower ledge as ours was 3 tiles thick.  Wife assists pt with all ADLs whenever he needs assistance:  his participation varies.  Talked to wife about 3:1 commode over toilet.  She states that pt has not had any diffiiculty getting up from standard commode, and she is not interested in this DME at the present time.  Pt has been standing in the shower, and he is unsteady.  Talked to her about a shower seat; she has a friend from whom she can borrow this.       Vision     Perception     Praxis      Pertinent Vitals/Pain Pain Assessment: No/denies pain     Hand Dominance     Extremity/Trunk Assessment Upper Extremity Assessment Upper Extremity Assessment: Generalized weakness          Communication Communication Communication:  (difficult to understand at times)   Cognition Arousal/Alertness: Awake/alert Behavior During Therapy: Flat affect Overall Cognitive Status: Within Functional Limits for tasks assessed                     General Comments  Exercises       Shoulder Instructions      Home Living Family/patient expects to be discharged to:: Private residence Living Arrangements: Spouse/significant other Available Help at Discharge: Family;Available 24 hours/day Type of Home: House Home Access: Stairs to enter [employer] of Steps: 2 Entrance Stairs-Rails: Right;Left;Can reach both Home Layout: One level     Bathroom Shower/Tub: Occupational psychologist: Standard     Home Equipment: Environmental consultant - 2 wheels          Prior Functioning/Environment Level of Independence: Needs assistance   Gait / Transfers Assistance Needed: ambulates with spouse, has required increased assist prior to admission          OT Diagnosis: Generalized weakness   OT Problem List: Decreased strength;Impaired balance (sitting and/or standing);Decreased activity tolerance;Decreased knowledge of use of DME or AE;Decreased safety awareness   OT Treatment/Interventions: Self-care/ADL training;DME and/or AE instruction;Balance training;Patient/family education    OT Goals(Current goals can be found in the care plan section) Acute Rehab OT Goals Patient Stated Goal: home as soon as possible OT Goal Formulation: With patient/family Time For Goal Achievement: 12/10/15 Potential to Achieve Goals: Good ADL Goals Pt Will Perform Grooming: with min guard assist;standing Pt Will Transfer to Toilet: with min guard assist;regular height toilet;ambulating Pt Will Perform Tub/Shower Transfer: Shower transfer;with min assist;ambulating;shower seat  OT Frequency: Min 2X/week   Barriers to D/C:            Co-evaluation              End of Session    Activity Tolerance: Patient tolerated treatment well Patient left: in bed;with call bell/phone within reach;with bed alarm set;with family/visitor present   Time: [id number] OT Time Calculation (min): 18 min Charges:  OT General Charges $OT Visit: 1 Procedure OT Evaluation $Initial OT Evaluation Tier I: 1 Procedure G-Codes: OT G-codes **NOT FOR INPATIENT CLASS** Functional Assessment Tool Used: clinical observation/judgment Functional Limitation: Self care Self Care Current Status [id number]): At least 40 percent but less than 60 percent impaired, limited or restricted Self Care Goal Status [id number]): At least 20 percent but less than 40 percent impaired, limited or restricted  [facility] 12/03/2015, 2:38 PM   Lesle Chris, OTR/L [phone] 12/03/2015

## 2015-12-03 NOTE — Progress Notes (Signed)
Initial Nutrition Assessment  DOCUMENTATION CODES:   Not applicable  INTERVENTION:  -Ensure Enlive po BID, each supplement provides 350 kcal and 20 grams of protein  NUTRITION DIAGNOSIS:   Inadequate oral intake related to inability to eat, chronic illness as evidenced by per patient/family report.  GOAL:   Patient will meet greater than or equal to 90% of their needs  MONITOR:   PO intake, I & O's, Supplement acceptance, Labs  REASON FOR ASSESSMENT:   Malnutrition Screening Tool    ASSESSMENT:   [name] is a 69 yo male The history is provided by the patient and the spouse. Patient with hx Parkinsons, prior cva, presents w acute gait disturbance the past 4-5 days. Spouse notes on xmas eve, pt w ?facial/eyelid droop, and weakness, inability to walk on own. States prior to that patient was ambulatory without assistance. Gait disturbance, requiring assistance to stand/walk has persisted since then. No trauma/injury. Pt denies change in vision or speech, although spouse had noted difficulty finding words at time of onset symptoms. Pt denies unilateral numbness or weakness, states feels weak all over. Has intermittently c/o diffuse back and bilateral lower leg pain in the past few days. No radicular pain. No saddle anesthesia. No problems w urinary retention, or incontinence. Compliant w normal meds. No recent change in meds or doses. No fever or chills. No chest pain or discomfort. Denies cough or uri c/o. No nvd. No dysuria.   Spoke with pt and wife at bedside. Pt has hx of service in [place] thanked him for his service - they were a wonderful couple to speak to. Also has Parkinson's similar to my Grandfather who was a Catering manager.  Wife spoke for pt mostly, pt has trouble speaking but did speak from time to time. She reports he was doing ok, has an ok appetite, but she has to force him to eat sometimes. Said that pt ate most of the food he has had thus far during  stay.   I encouraged him to eat and her to help him eat in order to maintain weight and quality of life.  Went and got ensure for pt to try, he enjoyed it- will continue to send more.   Labs and Medications reviewed Diet Order:  Diet regular Room service appropriate?: Yes; Fluid consistency:: Thin Diet - low sodium heart healthy  Skin:  Reviewed, no issues  Last BM:  PTA  Height:   Ht Readings from Last 1 Encounters:  12/02/15 6\' 2"  (1.88 m)    Weight:   Wt Readings from Last 1 Encounters:  12/02/15 199 lb (90.266 kg)    Ideal Body Weight:  91.81 kg  BMI:  Body mass index is 25.54 kg/(m^2).  Estimated Nutritional Needs:   Kcal:  2250 - 2700  Protein:  90-110 grams  Fluid:  >/= 2.25L  EDUCATION NEEDS:   No education needs identified at this time  Satira Anis. Chelsye Suhre, MS, RD LDN After Hours/Weekend Pager [phone]

## 2015-12-03 NOTE — Evaluation (Signed)
Physical Therapy Evaluation Patient Details Name: [name] MRN: [id number] DOB: 1946-03-26 Today's Date: 12/03/2015   History of Present Illness  69 y.o. male with a past medical history significant for HLD, HTN, bipolar disorder, stroke in 2011 without residual deficits, schizophrenia, B12 deficiency, and Parkinson disease, admitted to the hospital 12/02/15 due to new onset inability to walk.  Pt seen by Neurologist and adjustment of Sinemet recommended.  Clinical Impression  Pt admitted with above diagnosis. Pt currently with functional limitations due to the deficits listed below (see PT Problem List).  Pt will benefit from skilled PT to increase their independence and safety with mobility to allow discharge to the venue listed below.  Pt assisted with ambulating in hallway and required less assist as distance improved (as did pt's confidence).  Spouse reports she feels she can care for him at home if she has equipment; spouse politely refuses SNF for rehab.  Will continue to work with pt if remains in acute setting however recommend f/u HHPT.     Follow Up Recommendations Home health PT;Supervision/Assistance - 24 hour    Equipment Recommendations  Wheelchair (measurements PT);Hospital bed    Recommendations for Other Services       Precautions / Restrictions Precautions Precautions: Fall      Mobility  Bed Mobility Overal bed mobility: Needs Assistance Bed Mobility: Supine to Sit     Supine to sit: Min assist;HOB elevated     General bed mobility comments: assist for completing scoot to EOB, increased time and effort however encouraged pt to compete as independently as possible  Transfers Overall transfer level: Needs assistance Equipment used: 1 person hand held assist Transfers: Sit to/from Stand Sit to Stand: Mod assist         General transfer comment: verbal cues for technique and finding COG upon standing - present with increased posterior lean against  bed to self assist rise and steady, performance improved upon second time from recliner using armrests  Ambulation/Gait Ambulation/Gait assistance: Mod assist;Min assist;+2 safety/equipment (spouse followed with recliner) Ambulation Distance (Feet): 180 Feet Assistive device: 1 person hand held assist (Iv pole) Gait Pattern/deviations: Step-through pattern;Shuffle     General Gait Details: pt with increased anterior lean at times upon initial gait however improved with verbal cues to keep weight centered, pt required less assist with increased distance and cues for exaggerated large steps and heel to toe, maintained HHA with one UE and improved with other UE support from IV Pole, to rail to no second UE support  Stairs            Wheelchair Mobility    Modified Rankin (Stroke Patients Only)       Balance Overall balance assessment: Needs assistance;History of Falls         Standing balance support: Single extremity supported Standing balance-Leahy Scale: Poor Standing balance comment: requires at least one UE support                             Pertinent Vitals/Pain Pain Assessment: No/denies pain    Home Living Family/patient expects to be discharged to:: Private residence Living Arrangements: Spouse/significant other Available Help at Discharge: Family;Available 24 hours/day Type of Home: House Home Access: Stairs to enter Entrance Stairs-Rails: Right;Left;Can reach both Entrance Stairs-Number of Steps: 2 Home Layout: One level Home Equipment: Walker - 2 wheels      Prior Function Level of Independence: Needs assistance   Gait /  Transfers Assistance Needed: ambulates with spouse, has required increased assist prior to admission           Hand Dominance        Extremity/Trunk Assessment   Upper Extremity Assessment: Defer to OT evaluation           Lower Extremity Assessment: Generalized weakness;RLE deficits/detail;LLE  deficits/detail RLE Deficits / Details: bradykinesia throughout, stiff LEs, increased knee flexion at rest LLE Deficits / Details: bradykinesia throughout, stiff LEs, increased knee flexion at rest     Communication   Communication: Expressive difficulties (dysarthria)  Cognition Arousal/Alertness: Awake/alert Behavior During Therapy: WFL for tasks assessed/performed Overall Cognitive Status: Within Functional Limits for tasks assessed                      General Comments      Exercises        Assessment/Plan    PT Assessment Patient needs continued PT services  PT Diagnosis Abnormality of gait   PT Problem List Decreased balance;Decreased mobility;Impaired tone;Decreased coordination  PT Treatment Interventions DME instruction;Gait training;Functional mobility training;Patient/family education;Wheelchair mobility training;Therapeutic activities;Therapeutic exercise;Stair training;Balance training   PT Goals (Current goals can be found in the Care Plan section) Acute Rehab PT Goals Patient Stated Goal: home as soon as possible PT Goal Formulation: With patient/family Time For Goal Achievement: 12/10/15 Potential to Achieve Goals: Good    Frequency Min 5X/week   Barriers to discharge        Co-evaluation               End of Session Equipment Utilized During Treatment: Gait belt Activity Tolerance: Patient tolerated treatment well Patient left: in chair;with call bell/phone within reach;with chair alarm set;with family/visitor present      Functional Assessment Tool Used: clinical judgement Functional Limitation: Mobility: Walking and moving around Mobility: Walking and Moving Around Current Status [phone]): At least 40 percent but less than 60 percent impaired, limited or restricted Mobility: Walking and Moving Around Goal Status [phone]): At least 1 percent but less than 20 percent impaired, limited or restricted    Time: [id number] PT Time  Calculation (min) (ACUTE ONLY): 29 min   Charges:   PT Evaluation $Initial PT Evaluation Tier I: 1 Procedure PT Treatments $Gait Training: 8-22 mins   PT G Codes:   PT G-Codes **NOT FOR INPATIENT CLASS** Functional Assessment Tool Used: clinical judgement Functional Limitation: Mobility: Walking and moving around Mobility: Walking and Moving Around Current Status [id number]): At least 40 percent but less than 60 percent impaired, limited or restricted Mobility: Walking and Moving Around Goal Status [id number]): At least 1 percent but less than 20 percent impaired, limited or restricted    Marylynn Rigdon,KATHrine E 12/03/2015, 1:21 PM Carmelia Bake, PT, DPT 12/03/2015 Pager: [phone]

## 2015-12-03 NOTE — Discharge Summary (Signed)
Physician Discharge Summary  [name] [id number] DOB: 05-May-1946 DOA: 12/02/2015  PCP: Dennis Cower, MD  Admit date: 12/02/2015 Discharge date: 12/03/2015  Recommendations for Outpatient Follow-up:  1. Pt will need to follow up with PCP in 2-3 weeks post discharge 2. Please obtain BMP to evaluate electrolytes and kidney function 3. Please also check CBC to evaluate Hg and Hct levels  Discharge Diagnoses:  Active Problems: Progressive decline due to Parkinson's disease   Discharge Condition: Stable  Diet recommendation: Heart healthy diet discussed in details   History of present illness:  69 y.o. male with a past medical history significant for HLD, HTN, bipolar disorder, stroke in 2011 without residual deficits, schizophrenia, B12 deficiency, and Parkinson disease, admitted to the hospital due to new onset inability to walk.Per wife, pt has demonstrated progressive decline in his motor skills in the past week to the point that since 12/24 hasn't been able to stand up and walk without support. Patient reported that he can not function anymore and his gait had drmatically deteriorated.   Hospital Course:  Active Problems: LE weakness and difficulty with ambulation  - No obvious infection or metabolic derangements.  - MRI reveals questionable mild linear diffusion abnormality in the left paracentral pons but per neurology would not explain patient current presentation. - Patient seems to have progression of underlying PD and his current dose of Levodopa is obviously suboptimal at this moment..  - Neurology team recommended  1) Start adjusting Levodopa (25/100 mg every 4 hours) with subsequent adjustments as outpatient. 2) Agressive PT with gait training 3) Follow up with movement disorder specialist at [facility].  Procedures/Studies: Ct Head Wo Contrast  12/02/2015  CLINICAL DATA:  Right facial droop, ataxia, slow speech. EXAM: CT HEAD WITHOUT CONTRAST TECHNIQUE:  Contiguous axial images were obtained from the base of the skull through the vertex without intravenous contrast. COMPARISON:  CT and MRI scans of November 23, 2014. FINDINGS: Bony calvarium appears intact. Mild chronic ischemic white matter disease is noted. Mild diffuse cortical atrophy is noted. No mass effect or midline shift is noted. Ventricular size is within normal limits. There is no evidence of mass lesion, hemorrhage or acute infarction. IMPRESSION: Mild chronic ischemic white matter disease. Mild diffuse cortical atrophy. No acute intracranial abnormality seen. Electronically Signed   By: Marijo Conception, M.D.   On: 12/02/2015 10:14   Mr Brain Wo Contrast  12/02/2015  CLINICAL DATA:  69 year old male with abnormal speech, ataxia, cognitive difficulties since 11/27/2015. Syncope and fall on Christmas day. Initial encounter. EXAM: MRI HEAD WITHOUT CONTRAST TECHNIQUE: Multiplanar, multiecho pulse sequences of the brain and surrounding structures were obtained without intravenous contrast. COMPARISON:  Head CT without contrast 0946 hours today. Brain MRI 11/23/2014. FINDINGS: Major intracranial vascular flow voids are stable. Dominant and tortuous distal left vertebral artery re- demonstrated. Stable cerebral volume since 2015. Noisy diffusion-weighted imaging. Questionable mild linear trace diffusion abnormality in the left paracentral pons seen on series 4, image 18. However, this is not correlated on coronal images, and there is no associated T2 or FLAIR hyperintensity. Elsewhere no convincing restricted diffusion or evidence of acute infarction. No midline shift, mass effect, evidence of mass lesion, ventriculomegaly, extra-axial collection or acute intracranial hemorrhage. Cervicomedullary junction and pituitary are within normal limits. Negative visualized cervical spine. Pearline Cables and white matter signal is stable and normal for age. No cortical encephalomalacia or chronic cerebral blood products.  Visible internal auditory structures appear normal. Mastoids are clear. Paranasal sinuses are clear.  Negative orbit and scalp soft tissues; right scalp probable sebaceous cyst unchanged. Normal bone marrow signal. IMPRESSION: 1. Questionable mild linear diffusion abnormality in the left paracentral pons seen only on axial diffusion and not correlated on other sequences. Favor artifact unless the patient has new right side weakness. 2. Otherwise stable and negative for age noncontrast MRI appearance of the brain. Electronically Signed   By: Genevie Ann M.D.   On: 12/02/2015 11:43   Dg Chest Port 1 View  12/02/2015  CLINICAL DATA:  Altered mental status EXAM: PORTABLE CHEST 1 VIEW COMPARISON:  July 19, 2015 FINDINGS: There is no edema or consolidation. The heart size and pulmonary vascularity are normal. No adenopathy. Aorta is somewhat tortuous but stable. No bone lesions. IMPRESSION: No edema or consolidation. Electronically Signed   By: Lowella Grip III M.D.   On: 12/02/2015 19:49   Discharge Exam: Filed Vitals:   12/03/15 0730 12/03/15 0800  BP:    Pulse: 59   Temp:  97.2 F (36.2 C)  Resp: 12    Filed Vitals:   12/03/15 0600 12/03/15 0700 12/03/15 0730 12/03/15 0800  BP: 129/75     Pulse: 60 57 59   Temp:    97.2 F (36.2 C)  TempSrc:    Oral  Resp: 14 11 12    Height:      Weight:      SpO2: 96% 94% 97%     General: Pt is alert, follows commands appropriately, not in acute distress Cardiovascular: Regular rate and rhythm, no rubs, no gallops Respiratory: Clear to auscultation bilaterally, no wheezing, no crackles, no rhonchi Abdominal: Soft, non tender, non distended, bowel sounds +, no guarding  Discharge Instructions  Discharge Instructions    Diet - low sodium heart healthy    Complete by:  As directed      Increase activity slowly    Complete by:  As directed             Medication List    STOP taking these medications        metroNIDAZOLE 250 MG tablet   Commonly known as:  FLAGYL      TAKE these medications        ALPRAZolam 0.5 MG tablet  Commonly known as:  XANAX  Take 1 tablet (0.5 mg total) by mouth 3 (three) times daily as needed. for anxiety     amLODipine 5 MG tablet  Commonly known as:  NORVASC  Take 5 mg by mouth daily.     carbidopa-levodopa 25-100 MG tablet  Commonly known as:  SINEMET IR  Take 1 tablet by mouth 4 (four) times daily.     cyanocobalamin 1000 MCG tablet  Take 1 tablet (1,000 mcg total) by mouth daily.     DULCOLAX PO  Take 3 tablets by mouth at bedtime as needed (const).     FLAXSEED OIL PO  Take 2 capsules by mouth daily.     ibuprofen 200 MG tablet  Commonly known as:  ADVIL,MOTRIN  Take 200-400 mg by mouth every 6 (six) hours as needed for headache or moderate pain.     mirtazapine 15 MG tablet  Commonly known as:  REMERON  Take 1 tablet (15 mg total) by mouth at bedtime.     omega-3 acid ethyl esters 1 g capsule  Commonly known as:  LOVAZA  Take 3 g by mouth daily.     ondansetron 4 MG tablet  Commonly known as:  ZOFRAN  Take  1 tablet (4 mg total) by mouth every 6 (six) hours as needed for nausea.     pantoprazole 40 MG tablet  Commonly known as:  PROTONIX  Take 1 tablet (40 mg total) by mouth daily.     prochlorperazine 5 MG tablet  Commonly known as:  COMPAZINE  Take 1 tab every 6-8 hours as needed for nausea.     saccharomyces boulardii 250 MG capsule  Commonly known as:  FLORASTOR  Take 1 capsule (250 mg total) by mouth 2 (two) times daily.     traMADol 50 MG tablet  Commonly known as:  ULTRAM  Take 50 mg by mouth every 6 (six) hours as needed for moderate pain.            Follow-up Information    Follow up with Dennis Cower, MD.   Specialties:  Internal Medicine, Radiology   Contact information:   [place] [address] [phone]        The results of significant diagnostics from this hospitalization (including imaging, microbiology,  ancillary and laboratory) are listed below for reference.     Microbiology: Recent Results (from the past 240 hour(s))  MRSA PCR Screening     Status: None   Collection Time: 12/02/15  4:07 PM  Result Value Ref Range Status   MRSA by PCR NEGATIVE NEGATIVE Final    Comment:        The GeneXpert MRSA Assay (FDA approved for NASAL specimens only), is one component of a comprehensive MRSA colonization surveillance program. It is not intended to diagnose MRSA infection nor to guide or monitor treatment for MRSA infections.      Labs: Basic Metabolic Panel:  Recent Labs Lab 12/02/15 0920  NA 144  K 3.2*  CL 106  CO2 28  GLUCOSE 100*  BUN 16  CREATININE 0.80  CALCIUM 9.1   Liver Function Tests:  Recent Labs Lab 12/02/15 0920  AST 20  ALT 18  ALKPHOS 92  BILITOT 0.6  PROT 6.8  ALBUMIN 4.0   CBC:  Recent Labs Lab 12/02/15 0920  WBC 7.4  HGB 15.8  HCT 46.0  MCV 90.7  PLT 220   Cardiac Enzymes:  Recent Labs Lab 12/02/15 0920  TROPONINI <0.03   BNP: BNP (last 3 results)  Recent Labs  07/19/15 2138  BNP 42.1     SIGNED: Time coordinating discharge: 30 minutes  Faye Ramsay, MD  [facility] 12/03/2015, 9:18 AM Pager [phone]  If 7PM-7AM, please contact night-coverage [email or web address] Password [email or web address]

## 2015-12-04 LAB — HEMOGLOBIN A1C
Hgb A1c MFr Bld: 5.5 % (ref 4.8–5.6)
Mean Plasma Glucose: 111 mg/dL

## 2015-12-07 ENCOUNTER — Other Ambulatory Visit: Payer: Self-pay | Admitting: Physician Assistant

## 2015-12-09 DIAGNOSIS — K219 Gastro-esophageal reflux disease without esophagitis: Secondary | ICD-10-CM | POA: Diagnosis not present

## 2015-12-09 DIAGNOSIS — N400 Enlarged prostate without lower urinary tract symptoms: Secondary | ICD-10-CM | POA: Diagnosis not present

## 2015-12-09 DIAGNOSIS — E785 Hyperlipidemia, unspecified: Secondary | ICD-10-CM | POA: Diagnosis not present

## 2015-12-09 DIAGNOSIS — D510 Vitamin B12 deficiency anemia due to intrinsic factor deficiency: Secondary | ICD-10-CM | POA: Diagnosis not present

## 2015-12-09 DIAGNOSIS — G8929 Other chronic pain: Secondary | ICD-10-CM | POA: Diagnosis not present

## 2015-12-09 DIAGNOSIS — I10 Essential (primary) hypertension: Secondary | ICD-10-CM | POA: Diagnosis not present

## 2015-12-09 DIAGNOSIS — G20 Parkinson's disease: Secondary | ICD-10-CM | POA: Diagnosis not present

## 2015-12-09 DIAGNOSIS — Z8673 Personal history of transient ischemic attack (TIA), and cerebral infarction without residual deficits: Secondary | ICD-10-CM | POA: Diagnosis not present

## 2015-12-10 DIAGNOSIS — I10 Essential (primary) hypertension: Secondary | ICD-10-CM | POA: Diagnosis not present

## 2015-12-10 DIAGNOSIS — Z8673 Personal history of transient ischemic attack (TIA), and cerebral infarction without residual deficits: Secondary | ICD-10-CM | POA: Diagnosis not present

## 2015-12-10 DIAGNOSIS — N400 Enlarged prostate without lower urinary tract symptoms: Secondary | ICD-10-CM | POA: Diagnosis not present

## 2015-12-10 DIAGNOSIS — G8929 Other chronic pain: Secondary | ICD-10-CM | POA: Diagnosis not present

## 2015-12-10 DIAGNOSIS — E785 Hyperlipidemia, unspecified: Secondary | ICD-10-CM | POA: Diagnosis not present

## 2015-12-10 DIAGNOSIS — D510 Vitamin B12 deficiency anemia due to intrinsic factor deficiency: Secondary | ICD-10-CM | POA: Diagnosis not present

## 2015-12-10 DIAGNOSIS — K219 Gastro-esophageal reflux disease without esophagitis: Secondary | ICD-10-CM | POA: Diagnosis not present

## 2015-12-10 DIAGNOSIS — G20 Parkinson's disease: Secondary | ICD-10-CM | POA: Diagnosis not present

## 2015-12-13 DIAGNOSIS — Z8673 Personal history of transient ischemic attack (TIA), and cerebral infarction without residual deficits: Secondary | ICD-10-CM | POA: Diagnosis not present

## 2015-12-13 DIAGNOSIS — G20 Parkinson's disease: Secondary | ICD-10-CM | POA: Diagnosis not present

## 2015-12-13 DIAGNOSIS — I10 Essential (primary) hypertension: Secondary | ICD-10-CM | POA: Diagnosis not present

## 2015-12-13 DIAGNOSIS — E785 Hyperlipidemia, unspecified: Secondary | ICD-10-CM | POA: Diagnosis not present

## 2015-12-13 DIAGNOSIS — D510 Vitamin B12 deficiency anemia due to intrinsic factor deficiency: Secondary | ICD-10-CM | POA: Diagnosis not present

## 2015-12-13 DIAGNOSIS — K219 Gastro-esophageal reflux disease without esophagitis: Secondary | ICD-10-CM | POA: Diagnosis not present

## 2015-12-13 DIAGNOSIS — G8929 Other chronic pain: Secondary | ICD-10-CM | POA: Diagnosis not present

## 2015-12-13 DIAGNOSIS — N400 Enlarged prostate without lower urinary tract symptoms: Secondary | ICD-10-CM | POA: Diagnosis not present

## 2015-12-14 DIAGNOSIS — G8929 Other chronic pain: Secondary | ICD-10-CM | POA: Diagnosis not present

## 2015-12-14 DIAGNOSIS — D510 Vitamin B12 deficiency anemia due to intrinsic factor deficiency: Secondary | ICD-10-CM | POA: Diagnosis not present

## 2015-12-14 DIAGNOSIS — Z8673 Personal history of transient ischemic attack (TIA), and cerebral infarction without residual deficits: Secondary | ICD-10-CM | POA: Diagnosis not present

## 2015-12-14 DIAGNOSIS — E785 Hyperlipidemia, unspecified: Secondary | ICD-10-CM | POA: Diagnosis not present

## 2015-12-14 DIAGNOSIS — K219 Gastro-esophageal reflux disease without esophagitis: Secondary | ICD-10-CM | POA: Diagnosis not present

## 2015-12-14 DIAGNOSIS — I10 Essential (primary) hypertension: Secondary | ICD-10-CM | POA: Diagnosis not present

## 2015-12-14 DIAGNOSIS — G20 Parkinson's disease: Secondary | ICD-10-CM | POA: Diagnosis not present

## 2015-12-14 DIAGNOSIS — N400 Enlarged prostate without lower urinary tract symptoms: Secondary | ICD-10-CM | POA: Diagnosis not present

## 2015-12-17 DIAGNOSIS — G8929 Other chronic pain: Secondary | ICD-10-CM | POA: Diagnosis not present

## 2015-12-17 DIAGNOSIS — E785 Hyperlipidemia, unspecified: Secondary | ICD-10-CM | POA: Diagnosis not present

## 2015-12-17 DIAGNOSIS — K219 Gastro-esophageal reflux disease without esophagitis: Secondary | ICD-10-CM | POA: Diagnosis not present

## 2015-12-17 DIAGNOSIS — D510 Vitamin B12 deficiency anemia due to intrinsic factor deficiency: Secondary | ICD-10-CM | POA: Diagnosis not present

## 2015-12-17 DIAGNOSIS — Z8673 Personal history of transient ischemic attack (TIA), and cerebral infarction without residual deficits: Secondary | ICD-10-CM | POA: Diagnosis not present

## 2015-12-17 DIAGNOSIS — I10 Essential (primary) hypertension: Secondary | ICD-10-CM | POA: Diagnosis not present

## 2015-12-17 DIAGNOSIS — N400 Enlarged prostate without lower urinary tract symptoms: Secondary | ICD-10-CM | POA: Diagnosis not present

## 2015-12-17 DIAGNOSIS — G20 Parkinson's disease: Secondary | ICD-10-CM | POA: Diagnosis not present

## 2015-12-21 ENCOUNTER — Other Ambulatory Visit: Payer: Self-pay | Admitting: Internal Medicine

## 2015-12-21 DIAGNOSIS — G20 Parkinson's disease: Secondary | ICD-10-CM | POA: Diagnosis not present

## 2015-12-21 DIAGNOSIS — D510 Vitamin B12 deficiency anemia due to intrinsic factor deficiency: Secondary | ICD-10-CM | POA: Diagnosis not present

## 2015-12-21 DIAGNOSIS — E785 Hyperlipidemia, unspecified: Secondary | ICD-10-CM | POA: Diagnosis not present

## 2015-12-21 DIAGNOSIS — N400 Enlarged prostate without lower urinary tract symptoms: Secondary | ICD-10-CM | POA: Diagnosis not present

## 2015-12-21 DIAGNOSIS — K219 Gastro-esophageal reflux disease without esophagitis: Secondary | ICD-10-CM | POA: Diagnosis not present

## 2015-12-21 DIAGNOSIS — G8929 Other chronic pain: Secondary | ICD-10-CM | POA: Diagnosis not present

## 2015-12-21 DIAGNOSIS — Z8673 Personal history of transient ischemic attack (TIA), and cerebral infarction without residual deficits: Secondary | ICD-10-CM | POA: Diagnosis not present

## 2015-12-21 DIAGNOSIS — I10 Essential (primary) hypertension: Secondary | ICD-10-CM | POA: Diagnosis not present

## 2015-12-21 NOTE — Telephone Encounter (Signed)
Pt wife called in and said that pt needs the refill on Alprazolam

## 2015-12-22 NOTE — Telephone Encounter (Signed)
Done Done hardcopy to Heather/dahlia

## 2015-12-22 NOTE — Telephone Encounter (Signed)
Ok to refill 

## 2015-12-23 ENCOUNTER — Telehealth: Payer: Self-pay | Admitting: Physician Assistant

## 2015-12-23 NOTE — Telephone Encounter (Signed)
Rx faxed to pharmacy  

## 2015-12-23 NOTE — Telephone Encounter (Signed)
Can you please ask them to come to the lab for C Diff testing? If positive he will need therapy. He was given flagyl at his last clinic visit but never had C diff done, but his symptoms warrant testing to clarify if he has this or not. We will provide further recommendations once we have this result. Thanks

## 2015-12-23 NOTE — Telephone Encounter (Signed)
Explained the plan for the patient to Mrs. Petri. She will pick up from the lab, the containers she will need. It will be easier for the patient to collect the stool at home. She states understanding the stool must be unformed for testing. She will not give him any laxatives or anti-diarrheal medication.

## 2015-12-23 NOTE — Telephone Encounter (Signed)
Doc of the Day Patient with Parkinson's has his care giver (wife) call with complaints of "clear diarrhea." He has had 3 stools today that were liquid. He reports multiple liquid stools yesterday. Up to this point he had done well after completing a course of Flagyl in [place]. He drinks Ensure, but has little appetite for solids. Denies fever, nausea or abdominal pain. He is on a probiotic. No recent medication changes or antibiotics.

## 2015-12-24 ENCOUNTER — Other Ambulatory Visit: Payer: Medicare Other

## 2015-12-24 DIAGNOSIS — D510 Vitamin B12 deficiency anemia due to intrinsic factor deficiency: Secondary | ICD-10-CM | POA: Diagnosis not present

## 2015-12-24 DIAGNOSIS — I10 Essential (primary) hypertension: Secondary | ICD-10-CM | POA: Diagnosis not present

## 2015-12-24 DIAGNOSIS — Z8673 Personal history of transient ischemic attack (TIA), and cerebral infarction without residual deficits: Secondary | ICD-10-CM | POA: Diagnosis not present

## 2015-12-24 DIAGNOSIS — E785 Hyperlipidemia, unspecified: Secondary | ICD-10-CM | POA: Diagnosis not present

## 2015-12-24 DIAGNOSIS — G20 Parkinson's disease: Secondary | ICD-10-CM | POA: Diagnosis not present

## 2015-12-24 DIAGNOSIS — K219 Gastro-esophageal reflux disease without esophagitis: Secondary | ICD-10-CM | POA: Diagnosis not present

## 2015-12-24 DIAGNOSIS — N400 Enlarged prostate without lower urinary tract symptoms: Secondary | ICD-10-CM | POA: Diagnosis not present

## 2015-12-24 DIAGNOSIS — G8929 Other chronic pain: Secondary | ICD-10-CM | POA: Diagnosis not present

## 2016-01-03 DIAGNOSIS — G459 Transient cerebral ischemic attack, unspecified: Secondary | ICD-10-CM | POA: Diagnosis not present

## 2016-01-05 DIAGNOSIS — G20 Parkinson's disease: Secondary | ICD-10-CM | POA: Diagnosis not present

## 2016-01-05 DIAGNOSIS — F329 Major depressive disorder, single episode, unspecified: Secondary | ICD-10-CM | POA: Diagnosis not present

## 2016-01-05 DIAGNOSIS — F419 Anxiety disorder, unspecified: Secondary | ICD-10-CM | POA: Diagnosis not present

## 2016-01-05 DIAGNOSIS — F0390 Unspecified dementia without behavioral disturbance: Secondary | ICD-10-CM | POA: Diagnosis not present

## 2016-01-14 ENCOUNTER — Other Ambulatory Visit: Payer: Self-pay | Admitting: Internal Medicine

## 2016-01-14 NOTE — Telephone Encounter (Signed)
Done hardcopy to Corinne  

## 2016-01-14 NOTE — Telephone Encounter (Signed)
rx faxed to [facility] 

## 2016-01-20 ENCOUNTER — Other Ambulatory Visit: Payer: Self-pay | Admitting: Physician Assistant

## 2016-01-20 NOTE — Telephone Encounter (Signed)
Pt last seen on 10-25-15.

## 2016-01-24 ENCOUNTER — Telehealth: Payer: Self-pay

## 2016-01-24 DIAGNOSIS — G20 Parkinson's disease: Secondary | ICD-10-CM | POA: Diagnosis not present

## 2016-01-24 NOTE — Telephone Encounter (Signed)
Home Health Cert/Plan of Care received (12/09/2015 - 02/06/2016) and placed on MD's desk for signature

## 2016-01-25 NOTE — Telephone Encounter (Signed)
Paperwork signed, faxed, copy sent to scan 

## 2016-02-01 DIAGNOSIS — G459 Transient cerebral ischemic attack, unspecified: Secondary | ICD-10-CM | POA: Diagnosis not present

## 2016-03-01 ENCOUNTER — Other Ambulatory Visit: Payer: Self-pay | Admitting: Physician Assistant

## 2016-03-02 DIAGNOSIS — G459 Transient cerebral ischemic attack, unspecified: Secondary | ICD-10-CM | POA: Diagnosis not present

## 2016-03-03 NOTE — Telephone Encounter (Signed)
Ok to refill x 3 

## 2016-03-03 NOTE — Telephone Encounter (Signed)
Pt requesting refills on Compazine 5mg  po q 6-8 QID as needed for nausea. Last seen on 10-25-2015. Please advise.

## 2016-03-06 NOTE — Telephone Encounter (Signed)
Refilled as directed.  

## 2016-03-15 ENCOUNTER — Other Ambulatory Visit: Payer: Self-pay

## 2016-03-15 MED ORDER — PROCHLORPERAZINE MALEATE 5 MG PO TABS: ORAL_TABLET | ORAL | Status: DC

## 2016-03-21 ENCOUNTER — Telehealth: Payer: Self-pay | Admitting: Internal Medicine

## 2016-03-21 NOTE — Telephone Encounter (Signed)
Done hardcopy to Corinne  

## 2016-03-21 NOTE — Telephone Encounter (Signed)
Please advise can we refill medication

## 2016-03-22 ENCOUNTER — Telehealth: Payer: Self-pay

## 2016-03-22 NOTE — Telephone Encounter (Signed)
Medication sent.

## 2016-03-22 NOTE — Telephone Encounter (Signed)
Patient says the pharmacy has yet to get the med refill. Please send again.

## 2016-03-22 NOTE — Telephone Encounter (Signed)
Medication sent again

## 2016-03-22 NOTE — Telephone Encounter (Signed)
Pt called stated that [facility] do not have to Rx. Please resend.

## 2016-04-02 DIAGNOSIS — G459 Transient cerebral ischemic attack, unspecified: Secondary | ICD-10-CM | POA: Diagnosis not present

## 2016-04-21 ENCOUNTER — Other Ambulatory Visit (INDEPENDENT_AMBULATORY_CARE_PROVIDER_SITE_OTHER): Payer: Medicare Other

## 2016-04-21 ENCOUNTER — Encounter: Payer: Self-pay | Admitting: Internal Medicine

## 2016-04-21 ENCOUNTER — Other Ambulatory Visit: Payer: Self-pay | Admitting: Internal Medicine

## 2016-04-21 ENCOUNTER — Ambulatory Visit (INDEPENDENT_AMBULATORY_CARE_PROVIDER_SITE_OTHER): Payer: Medicare Other | Admitting: Internal Medicine

## 2016-04-21 VITALS — BP 122/84 | HR 91 | Temp 98.5°F | Resp 20 | Wt 193.0 lb

## 2016-04-21 DIAGNOSIS — I10 Essential (primary) hypertension: Secondary | ICD-10-CM | POA: Diagnosis not present

## 2016-04-21 DIAGNOSIS — Z1159 Encounter for screening for other viral diseases: Secondary | ICD-10-CM

## 2016-04-21 DIAGNOSIS — G20 Parkinson's disease: Secondary | ICD-10-CM

## 2016-04-21 DIAGNOSIS — R6889 Other general symptoms and signs: Secondary | ICD-10-CM

## 2016-04-21 DIAGNOSIS — G20A1 Parkinson's disease without dyskinesia, without mention of fluctuations: Secondary | ICD-10-CM

## 2016-04-21 DIAGNOSIS — Z0001 Encounter for general adult medical examination with abnormal findings: Secondary | ICD-10-CM

## 2016-04-21 DIAGNOSIS — F32A Depression, unspecified: Secondary | ICD-10-CM

## 2016-04-21 DIAGNOSIS — E785 Hyperlipidemia, unspecified: Secondary | ICD-10-CM

## 2016-04-21 DIAGNOSIS — F329 Major depressive disorder, single episode, unspecified: Secondary | ICD-10-CM | POA: Diagnosis not present

## 2016-04-21 DIAGNOSIS — R7302 Impaired glucose tolerance (oral): Secondary | ICD-10-CM

## 2016-04-21 DIAGNOSIS — Z23 Encounter for immunization: Secondary | ICD-10-CM | POA: Diagnosis not present

## 2016-04-21 DIAGNOSIS — L989 Disorder of the skin and subcutaneous tissue, unspecified: Secondary | ICD-10-CM

## 2016-04-21 LAB — LIPID PANEL
CHOL/HDL RATIO: 6
Cholesterol: 217 mg/dL — ABNORMAL HIGH (ref 0–200)
HDL: 39.2 mg/dL (ref >39.00)
LDL Cholesterol: 162 mg/dL — ABNORMAL HIGH (ref 0–99)
NONHDL: 178.13
Triglycerides: 83 mg/dL (ref 0.0–149.0)
VLDL: 16.6 mg/dL (ref 0.0–40.0)

## 2016-04-21 LAB — BASIC METABOLIC PANEL
BUN: 12 mg/dL (ref 6–23)
CALCIUM: 9.5 mg/dL (ref 8.4–10.5)
CO2: 27 meq/L (ref 19–32)
CREATININE: 1.01 mg/dL (ref 0.40–1.50)
Chloride: 105 mEq/L (ref 96–112)
GFR: 77.44 mL/min (ref >60.00)
GLUCOSE: 113 mg/dL — AB (ref 70–99)
Potassium: 3.8 mEq/L (ref 3.5–5.1)
Sodium: 142 mEq/L (ref 135–145)

## 2016-04-21 LAB — URINALYSIS, ROUTINE W REFLEX MICROSCOPIC
Hgb urine dipstick: NEGATIVE
NITRITE: NEGATIVE
SPECIFIC GRAVITY, URINE: 1.02 (ref 1.000–1.030)
Total Protein, Urine: 30 — AB
URINE GLUCOSE: NEGATIVE
UROBILINOGEN UA: 2 — AB (ref 0.0–1.0)
pH: 6 (ref 5.0–8.0)

## 2016-04-21 LAB — CBC WITH DIFFERENTIAL/PLATELET
BASOS ABS: 0 10*3/uL (ref 0.0–0.1)
Basophils Relative: 0.2 % (ref 0.0–3.0)
EOS PCT: 0.3 % (ref 0.0–5.0)
Eosinophils Absolute: 0 10*3/uL (ref 0.0–0.7)
HCT: 45.1 % (ref 39.0–52.0)
HEMOGLOBIN: 15.7 g/dL (ref 13.0–17.0)
LYMPHS ABS: 1.4 10*3/uL (ref 0.7–4.0)
LYMPHS PCT: 14.8 % (ref 12.0–46.0)
MCHC: 34.8 g/dL (ref 30.0–36.0)
MCV: 90.1 fl (ref 78.0–100.0)
MONOS PCT: 5.8 % (ref 3.0–12.0)
Monocytes Absolute: 0.5 10*3/uL (ref 0.1–1.0)
NEUTROS PCT: 78.9 % — AB (ref 43.0–77.0)
Neutro Abs: 7.2 10*3/uL (ref 1.4–7.7)
Platelets: 270 10*3/uL (ref 150.0–400.0)
RBC: 5.01 Mil/uL (ref 4.22–5.81)
RDW: 13.1 % (ref 11.5–15.5)
WBC: 9.2 10*3/uL (ref 4.0–10.5)

## 2016-04-21 LAB — HEPATIC FUNCTION PANEL
ALBUMIN: 4.4 g/dL (ref 3.5–5.2)
ALK PHOS: 94 U/L (ref 39–117)
ALT: 14 U/L (ref 0–53)
AST: 15 U/L (ref 0–37)
Bilirubin, Direct: 0.2 mg/dL (ref 0.0–0.3)
Total Bilirubin: 0.9 mg/dL (ref 0.2–1.2)
Total Protein: 6.6 g/dL (ref 6.0–8.3)

## 2016-04-21 LAB — PSA: PSA: 3.7 ng/mL (ref 0.10–4.00)

## 2016-04-21 LAB — HEMOGLOBIN A1C: Hgb A1c MFr Bld: 5.3 % (ref 4.6–6.5)

## 2016-04-21 LAB — TSH: TSH: 1.45 u[IU]/mL (ref 0.35–4.50)

## 2016-04-21 MED ORDER — ATORVASTATIN CALCIUM 10 MG PO TABS: 10.0000 mg | ORAL_TABLET | Freq: Every day | ORAL | Status: AC

## 2016-04-21 NOTE — Patient Instructions (Addendum)
You had the Pneumovax pneumonia shot today, since this is due once after 70yo, and you have only had the Prevnar pneumonia shot last year  Please continue all other medications as before, and refills have been done if requested.  OK to take the flomax in the AM  Please have the pharmacy call with any other refills you may need.  Please continue your efforts at being more active, low cholesterol diet, and weight control.  You are otherwise up to date with prevention measures today.  You will be contacted regarding the referral for: Neurology, and Dermatology  Please keep your appointments with your specialists as you may have planned  Please go to the LAB in the Basement (turn left off the elevator) for the tests to be done today  You will be contacted by phone if any changes need to be made immediately.  Otherwise, you will receive a letter about your results with an explanation, but please check with MyChart first.  Please remember to sign up for MyChart if you have not done so, as this will be important to you in the future with finding out test results, communicating by private email, and scheduling acute appointments online when needed.  Please return in 6 months, or sooner if needed, with Lab testing done 3-5 days before

## 2016-04-21 NOTE — Progress Notes (Signed)
Subjective:    Patient ID: [name], male    DOB: 09/07/1946, 70 y.o.   MRN: [id number]  HPI  Here for wellness and f/u;  Overall doing ok;  Pt denies Chest pain, worsening SOB, DOE, wheezing, orthopnea, PND, worsening LE edema, palpitations, dizziness or syncope.  Pt denies neurological change such as new headache, facial or extremity weakness.  Pt denies polydipsia, polyuria, or low sugar symptoms. Pt states overall good compliance with treatment and medications, good tolerability, and has been trying to follow appropriate diet.  Pt denies worsening depressive symptoms, suicidal ideation or panic. No fever, night sweats, wt loss, loss of appetite, or other constitutional symptoms.  Pt states good ability with ADL's, has low fall risk, home safety reviewed and adequate, no other significant changes in hearing or vision, and only occasionally active with exercise.  Has chronic depression, long term pt prior of Dr Buddy Duty locally but wife and pt have given up on psychiatry as mult tx little to no help.  Has not yet seen Dr Tat for neurology, levodopa increased last hosp admit, needs further f/u.   Pt continues to have recurring LBP without change in severity, bowel or bladder change, fever, wt loss,  worsening LE pain/numbness/weakness, gait change or falls. Denies urinary symptoms such as dysuria, frequency, urgency, flank pain, hematuria or n/v, fever, chills. Past Medical History  Diagnosis Date  . Hyperlipidemia   . Vitamin B12 deficiency   . Occasional tremors     leg tremors  . History of CVA (cerebrovascular accident) without residual deficits     2011  . Other degenerative diseases of the basal ganglia     S/P CVA 2011--  CHRONIC LEFT BASAL GANGLIA LACUNAR INFARCT  PER CT  . History of nonmelanoma skin cancer     EXCISION OF NOSE  . Hypertension   . Schizophrenia ([place])     HX PSYCHIATRIC ADMISSION'S  AND ECT ([place]) [place]  . Bipolar affective disorder ([place])   . ED (erectile dysfunction)  of organic origin   . Dementia   . Depression   . Generalized weakness     LOWER EXTREMITIES  . Chronic anxiety   . Urinary bladder stone   . History of bladder stone   . BPH (benign prostatic hyperplasia)   . Lower urinary tract symptoms (LUTS)   . Elevated PSA   . History of acute pyelonephritis     admission 07-19-2015  . Chronic constipation    Past Surgical History  Procedure Laterality Date  . Transanal excision anal and rectal polyps  11-02-2000  . Transthoracic echocardiogram  08-16-2014    Mild LVH/ grade I diastolic dysfunction/  ef 60-65%/  mild AV calification without stenosis/  mild RAE  . Cardiovascular stress test  06-18-2013  DR Jenkins [place]    LOW RISK NO EXERCISE LEXISCAN STUDY/  NO ISCHEMIA, CANNOT RULE OUT BASAL INFERIOR INFARCTION BUT POSSIBLE ARTIFACT GIVEN NORMAL WALL MOTION/  EF 73%  . Excision tumor upper left thigh  2012  . Transurethral incision of prostate N/A 10/03/2013    Procedure: TRANSURETHRAL INCISION OF THE PROSTATE (TUIP);  Surgeon: Claybon Jabs, MD;  Location: [facility];  Service: Urology;  Laterality: N/A;  . Cystoscopy with litholapaxy N/A 10/03/2013    Procedure: CYSTOSCOPY WITH LITHOLAPAXY;  Surgeon: Claybon Jabs, MD;  Location: [facility];  Service: Urology;  Laterality: N/A;  . Holmium laser application N/A [id number]    Procedure: HOLMIUM LASER APPLICATION;  Surgeon: Claybon Jabs, MD;  Location: [facility];  Service: Urology;  Laterality: N/A;  . Cystoscopy with litholapaxy N/A 08/13/2015    Procedure: CYSTOSCOPY WITH LITHOLAPAXY;  Surgeon: Kathie Rhodes, MD;  Location: [facility];  Service: Urology;  Laterality: N/A;  . Transurethral resection of prostate N/A 08/13/2015    Procedure: TRANSURETHRAL RESECTION OF THE PROSTATE (TURP);  Surgeon: Kathie Rhodes, MD;  Location[facility];  Service: Urology;  Laterality: N/A;  . Holmium laser application N/A [id number]      Procedure: HOLMIUM LASER APPLICATION;  Surgeon: Kathie Rhodes, MD;  Location: [facility];  Service: Urology;  Laterality: N/A;    reports that he has been smoking Cigarettes.  He has a 4.8 pack-year smoking history. He has never used smokeless tobacco. He reports that he does not drink alcohol or use illicit drugs. family history includes Dementia in his father and mother. There is no history of Colon cancer, Colon polyps, Rectal cancer, or Stomach cancer. No Known Allergies Current Outpatient Prescriptions on File Prior to Visit  Medication Sig Dispense Refill  . ALPRAZolam (XANAX) 0.5 MG tablet TAKE 1 TABLET BY MOUTH THREE TIMES DAILY AS NEEDED FOR ANXIETY 90 tablet 2  . amLODipine (NORVASC) 5 MG tablet Take 5 mg by mouth daily.    . Bisacodyl (DULCOLAX PO) Take 3 tablets by mouth at bedtime as needed (const).     . carbidopa-levodopa (SINEMET IR) 25-100 MG tablet Take 1 tablet by mouth 4 (four) times daily. 90 tablet 5  . Flaxseed, Linseed, (FLAXSEED OIL PO) Take 2 capsules by mouth daily.    Marland Kitchen ibuprofen (ADVIL,MOTRIN) 200 MG tablet Take 200-400 mg by mouth every 6 (six) hours as needed for headache or moderate pain.     . mirtazapine (REMERON) 15 MG tablet Take 1 tablet (15 mg total) by mouth at bedtime. 90 tablet 3  . omega-3 acid ethyl esters (LOVAZA) 1 G capsule Take 3 g by mouth daily.    . ondansetron (ZOFRAN) 4 MG tablet Take 1 tablet (4 mg total) by mouth every 6 (six) hours as needed for nausea. 20 tablet 0  . pantoprazole (PROTONIX) 40 MG tablet Take 1 tablet (40 mg total) by mouth daily. (Patient taking differently: Take 40 mg by mouth every morning. ) 90 tablet 3  . prochlorperazine (COMPAZINE) 5 MG tablet TAKE 1 TABLET BY MOUTH EVERY 6-8 HOURS AS NEEDED FOR NAUSEA 50 tablet 3  . saccharomyces boulardii (FLORASTOR) 250 MG capsule Take 1 capsule (250 mg total) by mouth 2 (two) times daily. 60 capsule 0  . traMADol (ULTRAM) 50 MG tablet TAKE 1 TABLET BY MOUTH EVERY  8 HOURS AS NEEDED 90 tablet 2  . vitamin B-12 1000 MCG tablet Take 1 tablet (1,000 mcg total) by mouth daily. 30 tablet 0   No current facility-administered medications on file prior to visit.    Review of Systems Constitutional: Negative for increased diaphoresis, or other activity, appetite or siginficant weight change other than noted HENT: Negative for worsening hearing loss, ear pain, facial swelling, mouth sores and neck stiffness.   Eyes: Negative for other worsening pain, redness or visual disturbance.  Respiratory: Negative for choking or stridor Cardiovascular: Negative for other chest pain and palpitations.  Gastrointestinal: Negative for worsening diarrhea, blood in stool, or abdominal distention Genitourinary: Negative for hematuria, flank pain or change in urine volume.  Musculoskeletal: Negative for myalgias or other joint complaints.  Skin: Negative for other color change  and wound or drainage.  Neurological: Negative for syncope and numbness. other than noted Hematological: Negative for adenopathy. or other swelling Psychiatric/Behavioral: Negative for hallucinations, SI, self-injury, decreased concentration or other worsening agitation.      Objective:   Physical Exam BP 122/84 mmHg  Pulse 91  Temp(Src) 98.5 F (36.9 C) (Oral)  Resp 20  Wt 193 lb (87.544 kg)  SpO2 95% VS noted,  Constitutional: Pt is oriented to person, place, and time. Appears well-developed and well-nourished, in no significant distress Head: Normocephalic and atraumatic  Eyes: Conjunctivae and EOM are normal. Pupils are equal, round, and reactive to light Right Ear: External ear normal.  Left Ear: External ear normal Nose: Nose normal.  Mouth/Throat: Oropharynx is clear and moist  Neck: Normal range of motion. Neck supple. No JVD present. No tracheal deviation present or significant neck LA or mass Cardiovascular: Normal rate, regular rhythm, normal heart sounds and intact distal pulses.    Pulmonary/Chest: Effort normal and breath sounds without rales or wheezing  Abdominal: Soft. Bowel sounds are normal. NT. No HSM  Musculoskeletal: Normal range of motion. Exhibits no edema Lymphadenopathy: Has no cervical adenopathy.  Neurological: Pt is alert and oriented to person,. Pt has normal reflexes. No cranial nerve deficit. Motor grossly intact, + PD movements Skin: Skin is warm and dry. No rash noted or new ulcers, has raised nodular skin lesion appro 1 cm right temple and right mid neck Psychiatric:  Has falt mood and affect. Behavior is normal.   Most recent echo dec 2016 summary only Result status: Final result      *[facility] [employer].  [place], [place] [address]  [phone]  ------------------------------------------------------------------- Transthoracic Echocardiography  Patient: [name], [name] MR #: [id number] Study Date: 12/03/2015 Gender: M Age: 72 Height: 182.9 cm Weight: 90.3 kg BSA: 2.15 m^2 Pt. Status: Room: [address] Lajean Saver [id number], Sanders Rowe Pavy [id number] PERFORMING [facility], Inpatient SONOGRAPHER Mikki Santee  cc:  ------------------------------------------------------------------- LV EF: 55% - 60%  ------------------------------------------------------------------- Indications: TIA 435.9.  ------------------------------------------------------------------- History: PMH: Altered mental status. PMH: Stroke. Risk factors: Hypertension. Dyslipidemia.  ------------------------------------------------------------------- Study Conclusions  - Left ventricle: The cavity size was normal. Wall thickness  was  increased in a pattern of mild LVH. Systolic function was normal.  The estimated ejection fraction was in the range of 55% to 60%.  Wall motion was normal; there were no regional wall motion  abnormalities.       Assessment & Plan:

## 2016-04-21 NOTE — Progress Notes (Signed)
Pre visit review using our clinic review tool, if applicable. No additional management support is needed unless otherwise documented below in the visit note. 

## 2016-04-22 LAB — HEPATITIS C ANTIBODY: HCV AB: NEGATIVE

## 2016-04-22 NOTE — Assessment & Plan Note (Signed)
stable overall by history and exam, recent data reviewed with pt, and pt to continue medical treatment as before,  to f/u any worsening symptoms or concerns BP Readings from Last 3 Encounters:  04/21/16 122/84  12/03/15 173/97  12/01/15 134/82

## 2016-04-22 NOTE — Assessment & Plan Note (Signed)
stable overall by history and exam, recent data reviewed with pt, and pt to continue medical treatment as before,  to f/u any worsening symptoms or concerns Lab Results  Component Value Date   HGBA1C 5.3 04/21/2016

## 2016-04-22 NOTE — Assessment & Plan Note (Signed)
Due for outpt f/u, ok for referral neurology

## 2016-04-22 NOTE — Assessment & Plan Note (Signed)
decliens further tx, verified no HI or SI

## 2016-04-22 NOTE — Assessment & Plan Note (Addendum)
stable overall by history and exam, recent data reviewed with pt, and pt to continue medical treatment as before,  to f/u any worsening symptoms or concerns Lab Results  Component Value Date   LDLCALC 162* 04/21/2016   To re-start statin, lower chol. diet

## 2016-04-22 NOTE — Assessment & Plan Note (Signed)

## 2016-04-22 NOTE — Assessment & Plan Note (Signed)
Cant r/o basal cell ca - for derm referral

## 2016-04-26 ENCOUNTER — Encounter: Payer: Self-pay | Admitting: Internal Medicine

## 2016-05-02 ENCOUNTER — Other Ambulatory Visit: Payer: Self-pay | Admitting: Internal Medicine

## 2016-05-02 DIAGNOSIS — G459 Transient cerebral ischemic attack, unspecified: Secondary | ICD-10-CM | POA: Diagnosis not present

## 2016-05-02 NOTE — Telephone Encounter (Signed)
Please advise 

## 2016-05-02 NOTE — Telephone Encounter (Signed)
Done hardcopy to Corinne  

## 2016-05-17 ENCOUNTER — Other Ambulatory Visit: Payer: Self-pay | Admitting: Internal Medicine

## 2016-05-24 ENCOUNTER — Ambulatory Visit: Payer: Medicare Other | Admitting: Neurology

## 2016-06-02 DIAGNOSIS — G459 Transient cerebral ischemic attack, unspecified: Secondary | ICD-10-CM | POA: Diagnosis not present

## 2016-06-20 ENCOUNTER — Other Ambulatory Visit: Payer: Self-pay | Admitting: Internal Medicine

## 2016-06-20 NOTE — Telephone Encounter (Signed)
Please advise 

## 2016-06-20 NOTE — Telephone Encounter (Signed)
Done hardcopy to Corinne  

## 2016-06-20 NOTE — Telephone Encounter (Signed)
Medication refill sent to pharmacy  

## 2016-07-02 DIAGNOSIS — G459 Transient cerebral ischemic attack, unspecified: Secondary | ICD-10-CM | POA: Diagnosis not present

## 2016-07-06 ENCOUNTER — Other Ambulatory Visit: Payer: Self-pay | Admitting: Internal Medicine

## 2016-08-01 ENCOUNTER — Other Ambulatory Visit: Payer: Self-pay | Admitting: Internal Medicine

## 2016-08-02 DIAGNOSIS — G459 Transient cerebral ischemic attack, unspecified: Secondary | ICD-10-CM | POA: Diagnosis not present

## 2016-08-14 ENCOUNTER — Other Ambulatory Visit: Payer: Self-pay | Admitting: Internal Medicine

## 2016-09-02 DIAGNOSIS — G459 Transient cerebral ischemic attack, unspecified: Secondary | ICD-10-CM | POA: Diagnosis not present

## 2016-09-05 ENCOUNTER — Telehealth: Payer: Self-pay

## 2016-09-05 MED ORDER — TRAMADOL HCL 50 MG PO TABS: 50.0000 mg | ORAL_TABLET | Freq: Three times a day (TID) | ORAL | 2 refills | Status: DC | PRN

## 2016-09-05 NOTE — Telephone Encounter (Signed)
Done hardcopy to Corinne  

## 2016-09-05 NOTE — Addendum Note (Signed)
Addended by: Biagio Borg on: 09/05/2016 12:32 PM   Modules accepted: Orders

## 2016-09-05 NOTE — Telephone Encounter (Signed)
Please advise patient requesting refill on tramadol

## 2016-09-05 NOTE — Telephone Encounter (Signed)
Faxed script back to [facility].../lmb 

## 2016-09-06 ENCOUNTER — Other Ambulatory Visit: Payer: Self-pay | Admitting: Internal Medicine

## 2016-09-12 ENCOUNTER — Telehealth: Payer: Self-pay | Admitting: Internal Medicine

## 2016-09-12 NOTE — Telephone Encounter (Signed)
Patient Name: [name] DOB: 1946-06-02 Initial Comment caller states husband's hands drawing and seems confused Nurse Assessment Nurse: Andria Frames, RN, Aeriel Date/Time (Eastern Time): 09/12/2016 10:46:44 AM Confirm and document reason for call. If symptomatic, describe symptoms. You must click the next button to save text entered. ---Caller states, pt hands are drawing he is drawing his fingers are drawing up sometimes. They go back down. Sometimes he seems like he is confused he gets up to walk and he will back up. This morning his left leg was like stiff. Has the patient traveled out of the country within the last 30 days? ---Not Applicable Does the patient have any new or worsening symptoms? ---Yes Will a triage be completed? ---Yes Related visit to physician within the last 2 weeks? ---No Does the PT have any chronic conditions? (i.e. diabetes, asthma, etc.) ---Yes List chronic conditions. ---dementia, parkinsons Is this a behavioral health or substance abuse call? ---No Guidelines Guideline Title Affirmed Question Affirmed Notes Confusion - Delirium [1] Acting confused (e.g., disoriented, slurred speech) AND [2] brief (now gone) Final Disposition User See Physician within 4 Hours (or PCP triage) Hensel, RN, Aeriel Comments Nurse did attempt to find an appt within 4 hour outcome. Nurse unable to find appt. Nurse did refer to [facility] or ED per direcitives. Caller refused that states, would like an appt at another time. Pt refused and requests a call back Referrals GO TO FACILITY REFUSED Disagree/Comply: Disagree Disagree/Comply Reason: Disagree with instructions

## 2016-09-13 ENCOUNTER — Emergency Department (HOSPITAL_COMMUNITY): Payer: Medicare Other

## 2016-09-13 ENCOUNTER — Encounter (HOSPITAL_COMMUNITY): Payer: Self-pay | Admitting: *Deleted

## 2016-09-13 ENCOUNTER — Emergency Department (HOSPITAL_COMMUNITY)
Admission: EM | Admit: 2016-09-13 | Discharge: 2016-09-13 | Disposition: A | Discharge status: Home / Self Care | Payer: Medicare Other | Attending: Emergency Medicine | Admitting: Emergency Medicine

## 2016-09-13 DIAGNOSIS — S0990XA Unspecified injury of head, initial encounter: Secondary | ICD-10-CM | POA: Diagnosis not present

## 2016-09-13 DIAGNOSIS — I5032 Chronic diastolic (congestive) heart failure: Secondary | ICD-10-CM | POA: Diagnosis not present

## 2016-09-13 DIAGNOSIS — M545 Low back pain: Secondary | ICD-10-CM | POA: Diagnosis not present

## 2016-09-13 DIAGNOSIS — F17210 Nicotine dependence, cigarettes, uncomplicated: Secondary | ICD-10-CM | POA: Diagnosis not present

## 2016-09-13 DIAGNOSIS — I110 Hypertensive heart disease with heart failure: Secondary | ICD-10-CM | POA: Diagnosis not present

## 2016-09-13 DIAGNOSIS — R51 Headache: Secondary | ICD-10-CM | POA: Diagnosis not present

## 2016-09-13 DIAGNOSIS — Z79899 Other long term (current) drug therapy: Secondary | ICD-10-CM | POA: Diagnosis not present

## 2016-09-13 DIAGNOSIS — R531 Weakness: Secondary | ICD-10-CM | POA: Diagnosis present

## 2016-09-13 DIAGNOSIS — N3000 Acute cystitis without hematuria: Secondary | ICD-10-CM

## 2016-09-13 DIAGNOSIS — Z85828 Personal history of other malignant neoplasm of skin: Secondary | ICD-10-CM | POA: Insufficient documentation

## 2016-09-13 DIAGNOSIS — I10 Essential (primary) hypertension: Secondary | ICD-10-CM | POA: Insufficient documentation

## 2016-09-13 DIAGNOSIS — S199XXA Unspecified injury of neck, initial encounter: Secondary | ICD-10-CM | POA: Diagnosis not present

## 2016-09-13 LAB — COMPREHENSIVE METABOLIC PANEL
ANION GAP: 9 (ref 5–15)
AST: 17 U/L (ref 15–41)
Albumin: 4.2 g/dL (ref 3.5–5.0)
Alkaline Phosphatase: 90 U/L (ref 38–126)
BUN: 15 mg/dL (ref 6–20)
CHLORIDE: 107 mmol/L (ref 101–111)
CO2: 24 mmol/L (ref 22–32)
CREATININE: 0.99 mg/dL (ref 0.61–1.24)
Calcium: 8.9 mg/dL (ref 8.9–10.3)
Glucose, Bld: 97 mg/dL (ref 65–99)
POTASSIUM: 3.6 mmol/L (ref 3.5–5.1)
Sodium: 140 mmol/L (ref 135–145)
Total Bilirubin: 1.1 mg/dL (ref 0.3–1.2)
Total Protein: 7 g/dL (ref 6.5–8.1)

## 2016-09-13 LAB — URINE MICROSCOPIC-ADD ON

## 2016-09-13 LAB — CBC WITH DIFFERENTIAL/PLATELET
BASOS ABS: 0 10*3/uL (ref 0.0–0.1)
BASOS PCT: 0 %
Eosinophils Absolute: 0.1 10*3/uL (ref 0.0–0.7)
Eosinophils Relative: 1 %
HEMATOCRIT: 41.2 % (ref 39.0–52.0)
HEMOGLOBIN: 14.8 g/dL (ref 13.0–17.0)
Lymphocytes Relative: 21 %
Lymphs Abs: 1.8 10*3/uL (ref 0.7–4.0)
MCH: 31 pg (ref 26.0–34.0)
MCHC: 35.9 g/dL (ref 30.0–36.0)
MCV: 86.2 fL (ref 78.0–100.0)
Monocytes Absolute: 0.7 10*3/uL (ref 0.1–1.0)
Monocytes Relative: 8 %
Neutro Abs: 5.9 10*3/uL (ref 1.7–7.7)
Neutrophils Relative %: 70 %
Platelets: 213 10*3/uL (ref 150–400)
RBC: 4.78 MIL/uL (ref 4.22–5.81)
RDW: 12.8 % (ref 11.5–15.5)
WBC: 8.5 10*3/uL (ref 4.0–10.5)

## 2016-09-13 LAB — URINALYSIS, ROUTINE W REFLEX MICROSCOPIC
BILIRUBIN URINE: NEGATIVE
Glucose, UA: NEGATIVE mg/dL
Hgb urine dipstick: NEGATIVE
Ketones, ur: NEGATIVE mg/dL
NITRITE: NEGATIVE
PH: 7 (ref 5.0–8.0)
Protein, ur: NEGATIVE mg/dL
SPECIFIC GRAVITY, URINE: 1.016 (ref 1.005–1.030)

## 2016-09-13 LAB — I-STAT CG4 LACTIC ACID, ED: LACTIC ACID, VENOUS: 1.04 mmol/L (ref 0.5–1.9)

## 2016-09-13 LAB — I-STAT TROPONIN, ED: TROPONIN I, POC: 0.01 ng/mL (ref 0.00–0.08)

## 2016-09-13 MED ORDER — CEPHALEXIN 500 MG PO CAPS: 500.0000 mg | ORAL_CAPSULE | Freq: Three times a day (TID) | ORAL | 0 refills | Status: DC

## 2016-09-13 MED ORDER — CEPHALEXIN 500 MG PO CAPS
500.0000 mg | ORAL_CAPSULE | Freq: Once | ORAL | Status: AC
  Administered 2016-09-13: 500 mg via ORAL
  Filled 2016-09-13: qty 1

## 2016-09-13 NOTE — ED Triage Notes (Signed)
Pt wife states the pt has been urinating more frequently and appears more confused than normal since yesterday morning. Pt has also noticed he is dragging his left foot when walking for the past 2 days. Pt wife says the pt fell and hit his head 2 weeks ago.

## 2016-09-13 NOTE — ED Provider Notes (Signed)
[facility] DEPT Provider Note   CSN: [id number] Arrival date & time: 09/13/16  1227     History   Chief Complaint Chief Complaint  Patient presents with  . Urinary Frequency  . Altered Mental Status    HPI [name] is a 70 y.o. male.  HPI [name] is a 70 y.o. male with history of dementia, bipolar disorder, CVA, UTIs, presents to emergency department complaining of generalized weakness, poor appetite, urinary frequency, weakness to the left leg. Patient was at home with his wife who provides most of the history. She states the patient had a mechanical fall after a chair slid from underneath him. Patient fell down hitting his head. There was no loss of consciousness at that time, patient was helped up by his wife and according to her "he seemed to be fine." Since then patient has been doing well up until 3 days ago. Wife states that she noticed that patient is more confused than usual, weak, no appetite, and dragging right leg when walking. She states this is new for him. Patient is complaining of lower back pain which wife states is not new. Patient states "it is burning sensation in the lower back." Patient has also had urinary frequency. Denies any dysuria. No hematuria. No fever or chills. No abdominal pain, no vomiting, no diarrhea. Denies chest pain or shortness of breath. Pt is not on any blood thinners  Past Medical History:  Diagnosis Date  . Bipolar affective disorder ([place])   . BPH (benign prostatic hyperplasia)   . Chronic anxiety   . Chronic constipation   . Dementia   . Depression   . ED (erectile dysfunction) of organic origin   . Elevated PSA   . Generalized weakness    LOWER EXTREMITIES  . History of acute pyelonephritis    admission 07-19-2015  . History of bladder stone   . 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 History of nonmelanoma skin cancer    EXCISION OF NOSE  . Hyperlipidemia   .  Hypertension   . Lower urinary tract symptoms (LUTS)   . Occasional tremors    leg tremors  . Other degenerative diseases of the basal ganglia    S/P CVA 2011--  CHRONIC LEFT BASAL GANGLIA LACUNAR INFARCT  PER CT  . Schizophrenia ([place])    HX PSYCHIATRIC ADMISSION'S  AND ECT ([place]) [place]  . Urinary bladder stone   . Vitamin B12 deficiency     Patient Active Problem List   Diagnosis Date Noted  . Gait instability   . Parkinson disease ([place])   . TIA (transient ischemic attack) 12/02/2015  . BPH with urinary obstruction 08/13/2015  . Acute pyelonephritis-culture negative s/p multiple rounds Abx 07/21/2015  . Cystitis 07/20/2015  . Obstructed, uropathy 07/20/2015  . History of CVA (cerebrovascular accident) 07/20/2015  . NSVT (nonsustained ventricular tachycardia) ([place]) 07/20/2015  . Chronic pain syndrome 06/10/2015  . Nausea without vomiting 06/09/2015  . Abnormal breath sounds 12/15/2014  . Parkinson's disease ([place]) 11/25/2014  . Vitamin B 12 deficiency 11/25/2014  . Ataxia 11/23/2014  . Chronic diastolic CHF (congestive heart failure) ([place]) 08/17/2014  . Hypokalemia 08/16/2014  . Weakness generalized 08/15/2014  . Urinary bladder stone 08/12/2014  . Gait disorder 08/12/2014  . Chronic nausea 12/02/2013  . Urinary incontinence 12/02/2013  . Calculus of bladder 10/03/2013  . Chest pain 06/10/2013  . Peripheral edema 06/10/2013  . Dyspepsia 12/24/2012  . Bladder neck  obstruction 12/24/2012  . Glucose intolerance (impaired glucose tolerance) 06/27/2012  . Bipolar affective disorder ([place]) 06/27/2012  . Tremor 06/27/2012  . Skin lesion 06/27/2012  . Erectile dysfunction 06/27/2012  . OAB (overactive bladder) 06/27/2012  . Encounter for preventative adult health care exam with abnormal findings 06/22/2012  . Depression 06/22/2012  . GERD (gastroesophageal reflux disease) 06/22/2012  . Fatty liver 06/22/2012  . Chronic headaches 06/22/2012  . Stroke ([place])   . Anxiety   .  Skin cancer of nose   . Rectal polyp   . Chest pain 02/01/2012  . BPH (benign prostatic hyperplasia) 02/01/2012  . Dementia   . HTN (hypertension)   . Vitamin B12 deficiency   . Schizophrenia ([place])   . Hyperlipidemia     Past Surgical History:  Procedure Laterality Date  . CARDIOVASCULAR STRESS TEST  06-18-2013  DR Jenkins [place]   LOW RISK NO EXERCISE LEXISCAN STUDY/  NO ISCHEMIA, CANNOT RULE OUT BASAL INFERIOR INFARCTION BUT POSSIBLE ARTIFACT GIVEN NORMAL WALL MOTION/  EF 73%  . CYSTOSCOPY WITH LITHOLAPAXY N/A 10/03/2013   Procedure: CYSTOSCOPY WITH LITHOLAPAXY;  Surgeon: Claybon Jabs, MD;  Location: [facility];  Service: Urology;  Laterality: N/A;  . CYSTOSCOPY WITH LITHOLAPAXY N/A 08/13/2015   Procedure: CYSTOSCOPY WITH LITHOLAPAXY;  Surgeon: Kathie Rhodes, MD;  Location: [facility];  Service: Urology;  Laterality: N/A;  . EXCISION TUMOR UPPER LEFT THIGH  2012  . HOLMIUM LASER APPLICATION N/A [id number]   Procedure: HOLMIUM LASER APPLICATION;  Surgeon: Claybon Jabs, MD;  Location: [facility];  Service: Urology;  Laterality: N/A;  . HOLMIUM LASER APPLICATION N/A [id number]   Procedure: HOLMIUM LASER APPLICATION;  Surgeon: Kathie Rhodes, MD;  Location: [facility];  Service: Urology;  Laterality: N/A;  . TRANSANAL EXCISION ANAL AND RECTAL POLYPS  11-02-2000  . TRANSTHORACIC ECHOCARDIOGRAM  08-16-2014   Mild LVH/ grade I diastolic dysfunction/  ef 60-65%/  mild AV calification without stenosis/  mild RAE  . TRANSURETHRAL INCISION OF PROSTATE N/A 10/03/2013   Procedure: TRANSURETHRAL INCISION OF THE PROSTATE (TUIP);  Surgeon: Claybon Jabs, MD;  Location: [facility];  Service: Urology;  Laterality: N/A;  . TRANSURETHRAL RESECTION OF PROSTATE N/A 08/13/2015   Procedure: TRANSURETHRAL RESECTION OF THE PROSTATE (TURP);  Surgeon: Kathie Rhodes, MD;  [facility];  Service: Urology;  Laterality:  N/A;       Home Medications    Prior to Admission medications   Medication Sig Start Date End Date Taking? Authorizing Provider  ALPRAZolam Duanne Moron) 0.5 MG tablet TAKE 1 TABLET BY MOUTH THREE TIMES DAILY FOR ANXIETY 06/20/16   Biagio Borg, MD  amLODipine (NORVASC) 5 MG tablet Take 5 mg by mouth daily.    Historical Provider, MD  amLODipine (NORVASC) 5 MG tablet TAKE 1 TABLET BY MOUTH EVERY DAY 08/15/16   Biagio Borg, MD  atorvastatin (LIPITOR) 10 MG tablet Take 1 tablet (10 mg total) by mouth daily. 04/21/16   Biagio Borg, MD  Bisacodyl (DULCOLAX PO) Take 3 tablets by mouth at bedtime as needed (const).     Historical Provider, MD  carbidopa-levodopa (SINEMET IR) 25-100 MG tablet TAKE 1 TABLET BY MOUTH FOUR TIMES DAILY 09/06/16   Biagio Borg, MD  Flaxseed, Linseed, (FLAXSEED OIL PO) Take 2 capsules by mouth daily.    Historical Provider, MD  ibuprofen (ADVIL,MOTRIN) 200 MG tablet Take 200-400 mg by mouth every 6 (six) hours as  needed for headache or moderate pain.     Historical Provider, MD  mirtazapine (REMERON) 15 MG tablet TAKE 1 TABLET(15 MG) BY MOUTH AT BEDTIME 07/06/16   Biagio Borg, MD  omega-3 acid ethyl esters (LOVAZA) 1 G capsule Take 3 g by mouth daily.    Historical Provider, MD  ondansetron (ZOFRAN) 4 MG tablet Take 1 tablet (4 mg total) by mouth every 6 (six) hours as needed for nausea. 07/22/15   Nita Sells, MD  pantoprazole (PROTONIX) 40 MG tablet Take 1 tablet (40 mg total) by mouth daily. Patient taking differently: Take 40 mg by mouth every morning.  06/09/15   Biagio Borg, MD  prochlorperazine (COMPAZINE) 5 MG tablet TAKE 1 TABLET BY MOUTH EVERY 6-8 HOURS AS NEEDED FOR NAUSEA 03/15/16   Amy S Esterwood, PA-C  saccharomyces boulardii (FLORASTOR) 250 MG capsule Take 1 capsule (250 mg total) by mouth 2 (two) times daily. 10/25/15   Amy S Esterwood, PA-C  traMADol (ULTRAM) 50 MG tablet Take 1 tablet (50 mg total) by mouth every 8 (eight) hours as needed. 09/05/16   Biagio Borg, MD  vitamin B-12 1000 MCG tablet Take 1 tablet (1,000 mcg total) by mouth daily. 08/17/14   Janece Canterbury, MD    Family History Family History  Problem Relation Age of Onset  . Dementia Father   . Dementia Mother   . Colon cancer Neg Hx   . Colon polyps Neg Hx   . Rectal cancer Neg Hx   . Stomach cancer Neg Hx     Social History Social History  Substance Use Topics  . Smoking status: Current Every Day Smoker    Packs/day: 0.10    Years: 48.00    Types: Cigarettes  . Smokeless tobacco: Never Used  . Alcohol use No     Allergies   Review of patient's allergies indicates no known allergies.   Review of Systems Review of Systems  Constitutional: Positive for appetite change. Negative for chills and fever.  Respiratory: Negative for cough, chest tightness and shortness of breath.   Cardiovascular: Negative for chest pain, palpitations and leg swelling.  Gastrointestinal: Negative for abdominal distention, abdominal pain, diarrhea, nausea and vomiting.  Genitourinary: Positive for frequency. Negative for dysuria, hematuria and urgency.  Musculoskeletal: Positive for arthralgias, back pain and gait problem. Negative for myalgias, neck pain and neck stiffness.  Skin: Negative for rash.  Allergic/Immunologic: Negative for immunocompromised state.  Neurological: Negative for dizziness, weakness, light-headedness, numbness and headaches.  Psychiatric/Behavioral: Positive for confusion.  All other systems reviewed and are negative.    Physical Exam Updated Vital Signs BP 134/89 (BP Location: Left Arm)   Pulse 82   Temp 98.8 F (37.1 C) (Oral)   Resp 15   Ht 6' (1.829 m)   Wt 88.7 kg   SpO2 96%   BMI 26.51 kg/m   Physical Exam  Constitutional: He is oriented to person, place, and time. He appears well-developed and well-nourished. No distress.  HENT:  Head: Normocephalic and atraumatic.  Eyes: Conjunctivae and EOM are normal. Pupils are equal, round, and  reactive to light.  Neck: Neck supple.  No midline cervical spine tenderness.  Cardiovascular: Normal rate, regular rhythm and normal heart sounds.   Pulmonary/Chest: Effort normal. No respiratory distress. He has no wheezes. He has no rales.  Abdominal: Soft. Bowel sounds are normal. He exhibits no distension. There is no tenderness. There is no rebound.  Musculoskeletal: He exhibits no edema.  Midline  lumbar spine tenderness. Full range of motion bilateral hips.  Neurological: He is alert and oriented to person, place, and time. He has normal reflexes. No cranial nerve deficit.  5/5 and equal upper and lower extremity strength bilaterally. Equal grip strength bilaterally. Normal finger to nose and heel to shin. No pronator drift. Patellar reflexes 2+ and equal bilaterally. Gait is shuffling, slow with starting the gait, slow turning.   Skin: Skin is warm and dry.  Nursing note and vitals reviewed.    ED Treatments / Results  Labs (all labs ordered are listed, but only abnormal results are displayed) Labs Reviewed  URINALYSIS, ROUTINE W REFLEX MICROSCOPIC (NOT AT [facility]) - Abnormal; Notable for the following:       Result Value   APPearance CLOUDY (*)    Leukocytes, UA MODERATE (*)    All other components within normal limits  URINE MICROSCOPIC-ADD ON - Abnormal; Notable for the following:    Squamous Epithelial / LPF 0-5 (*)    Bacteria, UA MANY (*)    Casts HYALINE CASTS (*)    All other components within normal limits  COMPREHENSIVE METABOLIC PANEL - Abnormal; Notable for the following:    ALT <5 (*)    All other components within normal limits  URINE CULTURE  CBC WITH DIFFERENTIAL/PLATELET  I-STAT CG4 LACTIC ACID, ED  I-STAT TROPOININ, ED  I-STAT CG4 LACTIC ACID, ED    EKG  EKG Interpretation  Date/Time:  Wednesday September 13 2016 17:39:31 EDT Ventricular Rate:  75 PR Interval:    QRS Duration: 81 QT Interval:  435 QTC Calculation: 486 R Axis:   15 Text  Interpretation:  Sinus rhythm Atrial premature complex Short PR interval Probable left atrial enlargement Abnormal R-wave progression, early transition Borderline prolonged QT interval background noise Otherwise no significant change Confirmed by FLOYD MD, DANIEL [phone]) on 09/13/2016 5:57:39 PM       Radiology Dg Lumbar Spine Complete  Result Date: 09/13/2016 CLINICAL DATA:  Fall 2 weeks ago, low back pain and left leg weakness EXAM: LUMBAR SPINE - COMPLETE 4+ VIEW COMPARISON:  CT 08/22/2015 FINDINGS: Five rib-bearing lumbar type vertebra. No acute fracture or malalignment. Vertebral body heights are maintained. Minimal retrolisthesis of L4 on L5. Mild narrowing at L4-L5 and L5-S1. Anterior osteophytes. Atherosclerotic vascular disease of the aorta. IMPRESSION: Degenerative changes.  No acute osseous abnormality. Electronically Signed   By: Donavan Foil M.D.   On: 09/13/2016 17:15   Ct Head Wo Contrast  Result Date: 09/13/2016 CLINICAL DATA:  [place] 2 weeks ago, left lower extremity weakness EXAM: CT HEAD WITHOUT CONTRAST CT CERVICAL SPINE WITHOUT CONTRAST TECHNIQUE: Multidetector CT imaging of the head and cervical spine was performed following the standard protocol without intravenous contrast. Multiplanar CT image reconstructions of the cervical spine were also generated. COMPARISON:  MRI 12/02/2015, CT scan brain 12/02/2015 FINDINGS: CT HEAD FINDINGS Brain: No evidence of acute infarction, hemorrhage, hydrocephalus, extra-axial collection or mass lesion/mass effect. Mild diffuse atrophy unchanged. Stable ventricular size and configuration. Vascular: Minimal calcifications in the carotid arteries at the skullbase. No hyperdense vessels. Skull: Mastoid air cells clear.  No skull fracture. Sinuses/Orbits: Paranasal sinuses clear.  Globes grossly intact. Other: None CT CERVICAL SPINE FINDINGS Alignment: Straightening of the cervical spine. No subluxation is seen. Skull base and vertebrae:  Craniovertebral junction is intact. No fracture. Vertebral body heights are maintained. Soft tissues and spinal canal: No prevertebral soft tissue enlargement. No bony canal impingement. Disc levels: Mild disc space narrowing at C3-C4. Anterior  osteophytes at C4-C5, C5-C6 and C6-C7. Mild multilevel bilateral facet changes. Upper chest: Lung apices clear. 1.7 cm hypodense nodule in the right lobe of the thyroid gland. Partially visualized low density lesion in the right lobe of the thyroid gland. Lower left lobe of the thyroid gland. Other: None IMPRESSION: 1. No acute intracranial abnormality. 2. Straightening of the cervical spine. No acute fracture or malalignment. 3. Hypodense nodules in the thyroid gland. Nonemergent thyroid ultrasound may be performed as indicated. Electronically Signed   By: Donavan Foil M.D.   On: 09/13/2016 17:39   Ct Cervical Spine Wo Contrast  Result Date: 09/13/2016 CLINICAL DATA:  [place] 2 weeks ago, left lower extremity weakness EXAM: CT HEAD WITHOUT CONTRAST CT CERVICAL SPINE WITHOUT CONTRAST TECHNIQUE: Multidetector CT imaging of the head and cervical spine was performed following the standard protocol without intravenous contrast. Multiplanar CT image reconstructions of the cervical spine were also generated. COMPARISON:  MRI 12/02/2015, CT scan brain 12/02/2015 FINDINGS: CT HEAD FINDINGS Brain: No evidence of acute infarction, hemorrhage, hydrocephalus, extra-axial collection or mass lesion/mass effect. Mild diffuse atrophy unchanged. Stable ventricular size and configuration. Vascular: Minimal calcifications in the carotid arteries at the skullbase. No hyperdense vessels. Skull: Mastoid air cells clear.  No skull fracture. Sinuses/Orbits: Paranasal sinuses clear.  Globes grossly intact. Other: None CT CERVICAL SPINE FINDINGS Alignment: Straightening of the cervical spine. No subluxation is seen. Skull base and vertebrae: Craniovertebral junction is intact. No fracture.  Vertebral body heights are maintained. Soft tissues and spinal canal: No prevertebral soft tissue enlargement. No bony canal impingement. Disc levels: Mild disc space narrowing at C3-C4. Anterior osteophytes at C4-C5, C5-C6 and C6-C7. Mild multilevel bilateral facet changes. Upper chest: Lung apices clear. 1.7 cm hypodense nodule in the right lobe of the thyroid gland. Partially visualized low density lesion in the right lobe of the thyroid gland. Lower left lobe of the thyroid gland. Other: None IMPRESSION: 1. No acute intracranial abnormality. 2. Straightening of the cervical spine. No acute fracture or malalignment. 3. Hypodense nodules in the thyroid gland. Nonemergent thyroid ultrasound may be performed as indicated. Electronically Signed   By: Donavan Foil M.D.   On: 09/13/2016 17:39    Procedures Procedures (including critical care time)  Medications Ordered in ED Medications - No data to display   Initial Impression / Assessment and Plan / ED Course  I have reviewed the triage vital signs and the nursing notes.  Pertinent labs & imaging results that were available during my care of the patient were reviewed by me and considered in my medical decision making (see chart for details).  Clinical Course   Patient emergency department with worsening gait, confusion, loss of appetite, urinary frequency. Patient does have history of Parkinson's and dementia. Patient is in no acute distress. Vital signs are normal. Will check labs, urinalysis, we'll do imaging of the head, cervical spine, lumbar spine because of a fall.  Urine shows infection. Labs all normal. I ambulated patient myself, I do not see any significant leg dragging, patient does have shuffling gait with difficulty turning and starting the gait. Will call urology for advise, otherwise no focal deficits on exam.  7:31 PM Spoke with Dr. Shon Hale with neurology. Since no focal neurological findings, symptoms may be due to UTI. Imaging  not recommended.  Will treat. Will start on keflex. Pt OK to be dc home with close outpatient follow up. Discussed results and plan with wife. She will bring pt back if worsening.   Final  Clinical Impressions(s) / ED Diagnoses   Final diagnoses:  Acute cystitis without hematuria    New Prescriptions New Prescriptions   CEPHALEXIN (KEFLEX) 500 MG CAPSULE    Take 1 capsule (500 mg total) by mouth 3 (three) times daily.     Jeannett Senior, PA-C 09/13/16 [place], PA-C 09/13/16 [place], DO 09/13/16 2004

## 2016-09-13 NOTE — ED Notes (Signed)
He is in CT as I write this. His wife is at bedside.

## 2016-09-13 NOTE — Discharge Instructions (Signed)
Keflex as prescribed until all gone. Please follow with primary care doctor in 2-3 days for recheck. Return to emergency department worsening symptoms.

## 2016-09-14 LAB — URINE CULTURE: Culture: NO GROWTH

## 2016-09-19 ENCOUNTER — Other Ambulatory Visit: Payer: Self-pay | Admitting: Internal Medicine

## 2016-09-19 NOTE — Telephone Encounter (Signed)
Done hardcopy to Corinne  

## 2016-09-20 NOTE — Telephone Encounter (Signed)
faxed

## 2016-09-21 ENCOUNTER — Other Ambulatory Visit (INDEPENDENT_AMBULATORY_CARE_PROVIDER_SITE_OTHER): Payer: Medicare Other

## 2016-09-21 ENCOUNTER — Encounter: Payer: Self-pay | Admitting: Internal Medicine

## 2016-09-21 ENCOUNTER — Ambulatory Visit (INDEPENDENT_AMBULATORY_CARE_PROVIDER_SITE_OTHER): Payer: Medicare Other | Admitting: Internal Medicine

## 2016-09-21 VITALS — BP 130/72 | HR 82 | Temp 98.2°F | Resp 20 | Wt 198.0 lb

## 2016-09-21 DIAGNOSIS — E041 Nontoxic single thyroid nodule: Secondary | ICD-10-CM | POA: Diagnosis not present

## 2016-09-21 DIAGNOSIS — E785 Hyperlipidemia, unspecified: Secondary | ICD-10-CM | POA: Diagnosis not present

## 2016-09-21 DIAGNOSIS — R8299 Other abnormal findings in urine: Secondary | ICD-10-CM

## 2016-09-21 DIAGNOSIS — R739 Hyperglycemia, unspecified: Secondary | ICD-10-CM | POA: Diagnosis not present

## 2016-09-21 DIAGNOSIS — R7302 Impaired glucose tolerance (oral): Secondary | ICD-10-CM | POA: Diagnosis not present

## 2016-09-21 DIAGNOSIS — Z0001 Encounter for general adult medical examination with abnormal findings: Secondary | ICD-10-CM

## 2016-09-21 DIAGNOSIS — G20 Parkinson's disease: Secondary | ICD-10-CM

## 2016-09-21 DIAGNOSIS — R82998 Other abnormal findings in urine: Secondary | ICD-10-CM

## 2016-09-21 LAB — URINALYSIS, ROUTINE W REFLEX MICROSCOPIC
HGB URINE DIPSTICK: NEGATIVE
LEUKOCYTES UA: NEGATIVE
Nitrite: NEGATIVE
SPECIFIC GRAVITY, URINE: 1.015 (ref 1.000–1.030)
Total Protein, Urine: 30 — AB
URINE GLUCOSE: NEGATIVE
Urobilinogen, UA: 1 (ref 0.0–1.0)
pH: 6.5 (ref 5.0–8.0)

## 2016-09-21 LAB — BASIC METABOLIC PANEL
BUN: 16 mg/dL (ref 6–23)
CALCIUM: 9.2 mg/dL (ref 8.4–10.5)
CHLORIDE: 104 meq/L (ref 96–112)
CO2: 26 meq/L (ref 19–32)
Creatinine, Ser: 0.85 mg/dL (ref 0.40–1.50)
GFR: 94.38 mL/min (ref >60.00)
Glucose, Bld: 97 mg/dL (ref 70–99)
Potassium: 3.6 mEq/L (ref 3.5–5.1)
SODIUM: 137 meq/L (ref 135–145)

## 2016-09-21 LAB — LIPID PANEL
CHOL/HDL RATIO: 5
CHOLESTEROL: 185 mg/dL (ref 0–200)
HDL: 38 mg/dL — AB (ref >39.00)
LDL CALC: 124 mg/dL — AB (ref 0–99)
NonHDL: 146.59
TRIGLYCERIDES: 115 mg/dL (ref 0.0–149.0)
VLDL: 23 mg/dL (ref 0.0–40.0)

## 2016-09-21 LAB — HEPATIC FUNCTION PANEL
ALBUMIN: 4.3 g/dL (ref 3.5–5.2)
ALK PHOS: 98 U/L (ref 39–117)
ALT: 6 U/L (ref 0–53)
AST: 12 U/L (ref 0–37)
BILIRUBIN DIRECT: 0.1 mg/dL (ref 0.0–0.3)
TOTAL PROTEIN: 7.1 g/dL (ref 6.0–8.3)
Total Bilirubin: 0.8 mg/dL (ref 0.2–1.2)

## 2016-09-21 LAB — HEMOGLOBIN A1C: HEMOGLOBIN A1C: 5.2 % (ref 4.6–6.5)

## 2016-09-21 NOTE — Progress Notes (Signed)
Subjective:    Patient ID: [name], male    DOB: 20-Jul-1946, 70 y.o.   MRN: [id number]  HPI  Here with wife to f/u recent ED visit with c/o mild worsening confusion, and urinary odor,, Denies urinary symptoms such as dysuria, frequency, urgency, flank pain, hematuria or n/v, fever, chills.  Tx empirically with cephalexin course x 1 wk, review of recent urine cx is neg, and wife reports urine not improved, essentially no change.    Thyroid hypodense seen incidentally on CT head/neck; Denies hyper or hypo thyroid symptoms such as voice, skin or hair change.  Also Has worsening gait difficulty with balance, wife states was supposed to be seen per parkinson neuro while hospd but did not happen, and has not heard about referral placed may 2017 as outpt as well. Pt denies chest pain, increased sob or doe, wheezing, orthopnea, PND, increased LE swelling, palpitations, dizziness or syncope.   Pt denies polydipsia, polyuria.     Past Medical History:  Diagnosis Date  . Bipolar affective disorder ([place])   . BPH (benign prostatic hyperplasia)   . Chronic anxiety   . Chronic constipation   . Dementia   . Depression   . ED (erectile dysfunction) of organic origin   . Elevated PSA   . Generalized weakness    LOWER EXTREMITIES  . History of acute pyelonephritis    admission 07-19-2015  . History of bladder stone   . 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 History of nonmelanoma skin cancer    EXCISION OF NOSE  . Hyperlipidemia   . Hypertension   . Lower urinary tract symptoms (LUTS)   . Occasional tremors    leg tremors  . Other degenerative diseases of the basal ganglia    S/P CVA 2011--  CHRONIC LEFT BASAL GANGLIA LACUNAR INFARCT  PER CT  . Schizophrenia ([place])    HX PSYCHIATRIC ADMISSION'S  AND ECT ([place]) [place]  . Urinary bladder stone   . Vitamin B12 deficiency    Past Surgical History:  Procedure Laterality Date  . CARDIOVASCULAR STRESS  TEST  06-18-2013  DR Jenkins [place]   LOW RISK NO EXERCISE LEXISCAN STUDY/  NO ISCHEMIA, CANNOT RULE OUT BASAL INFERIOR INFARCTION BUT POSSIBLE ARTIFACT GIVEN NORMAL WALL MOTION/  EF 73%  . CYSTOSCOPY WITH LITHOLAPAXY N/A 10/03/2013   Procedure: CYSTOSCOPY WITH LITHOLAPAXY;  Surgeon: Claybon Jabs, MD;  Location: [facility];  Service: Urology;  Laterality: N/A;  . CYSTOSCOPY WITH LITHOLAPAXY N/A 08/13/2015   Procedure: CYSTOSCOPY WITH LITHOLAPAXY;  Surgeon: Kathie Rhodes, MD;  Location: [place];  Service: Urology;  Laterality: N/A;  . EXCISION TUMOR UPPER LEFT THIGH  2012  . HOLMIUM LASER APPLICATION N/A [id number]   Procedure: HOLMIUM LASER APPLICATION;  Surgeon: Claybon Jabs, MD;  Location[facility];  Service: Urology;  Laterality: N/A;  . HOLMIUM LASER APPLICATION N/A [id number]   Procedure: HOLMIUM LASER APPLICATION;  Surgeon: Kathie Rhodes, MD;  Location: [facility];  Service: Urology;  Laterality: N/A;  . TRANSANAL EXCISION ANAL AND RECTAL POLYPS  11-02-2000  . TRANSTHORACIC ECHOCARDIOGRAM  08-16-2014   Mild LVH/ grade I diastolic dysfunction/  ef 60-65%/  mild AV calification without stenosis/  mild RAE  . TRANSURETHRAL INCISION OF PROSTATE N/A 10/03/2013   Procedure: TRANSURETHRAL INCISION OF THE PROSTATE (TUIP);  Surgeon: Claybon Jabs, MD;  Location: [facility];  Service: Urology;  Laterality: N/A;  . TRANSURETHRAL RESECTION OF PROSTATE N/A 08/13/2015   Procedure: TRANSURETHRAL RESECTION OF THE PROSTATE (TURP);  Surgeon: Kathie Rhodes, MD;  Location: [facility];  Service: Urology;  Laterality: N/A;    reports that he has been smoking Cigarettes.  He has a 4.80 pack-year smoking history. He has never used smokeless tobacco. He reports that he does not drink alcohol or use drugs. family history includes Dementia in his father and mother. No Known Allergies Current Outpatient Prescriptions on  File Prior to Visit  Medication Sig Dispense Refill  . ALPRAZolam (XANAX) 0.5 MG tablet TAKE 1 TABLET BY MOUTH THREE TIMES DAILY FOR ANXIETY 90 tablet 2  . alum & mag hydroxide-simeth (MAALOX/MYLANTA) 200-200-20 MG/5ML suspension Take 30 mLs by mouth every 6 (six) hours as needed for indigestion or heartburn.    Marland Kitchen amLODipine (NORVASC) 5 MG tablet TAKE 1 TABLET BY MOUTH EVERY DAY (Patient taking differently: TAKE 1 TABLET BY MOUTH EVERY MORNING) 90 tablet 0  . atorvastatin (LIPITOR) 10 MG tablet Take 1 tablet (10 mg total) by mouth daily. 90 tablet 3  . carbidopa-levodopa (SINEMET IR) 25-100 MG tablet TAKE 1 TABLET BY MOUTH FOUR TIMES DAILY (Patient taking differently: TAKE 1 TABLET BY MOUTH THREE TIMES A DAY) 90 tablet 0  . cephALEXin (KEFLEX) 500 MG capsule Take 1 capsule (500 mg total) by mouth 3 (three) times daily. 21 capsule 0  . ENSURE (ENSURE) Take 237 mLs by mouth 2 (two) times daily between meals.    . Flaxseed, Linseed, (FLAX SEED OIL) 1000 MG CAPS Take 2,000 mg by mouth daily with breakfast.    . influenza vac recom quadrivalent (FLUBLOK) 0.5 ML injection Inject 0.5 mLs into the muscle once.    . Lactobacillus Rhamnosus, GG, (CULTURELLE) CAPS Take 1 capsule by mouth daily.    . mirtazapine (REMERON) 15 MG tablet TAKE 1 TABLET(15 MG) BY MOUTH AT BEDTIME 90 tablet 3  . Omega-3 Fatty Acids (FISH OIL) 1200 MG CAPS Take 3 capsules by mouth daily.    . pantoprazole (PROTONIX) 40 MG tablet Take 1 tablet (40 mg total) by mouth daily. 90 tablet 3  . prochlorperazine (COMPAZINE) 5 MG tablet TAKE 1 TABLET BY MOUTH EVERY 6-8 HOURS AS NEEDED FOR NAUSEA (Patient taking differently: Take 5 mg by mouth every 6 (six) hours as needed for nausea or vomiting. ) 50 tablet 3  . traMADol (ULTRAM) 50 MG tablet Take 1 tablet (50 mg total) by mouth every 8 (eight) hours as needed. (Patient taking differently: Take 50 mg by mouth every 8 (eight) hours as needed for moderate pain. ) 90 tablet 2  . vitamin B-12 1000  MCG tablet Take 1 tablet (1,000 mcg total) by mouth daily. 30 tablet 0   No current facility-administered medications on file prior to visit.    Review of Systems  Constitutional: Negative for unusual diaphoresis or night sweats HENT: Negative for ear swelling or discharge Eyes: Negative for worsening visual haziness  Respiratory: Negative for choking and stridor.   Gastrointestinal: Negative for distension or worsening eructation Genitourinary: Negative for retention or change in urine volume.  Musculoskeletal: Negative for other MSK pain or swelling Skin: Negative for color change and worsening wound Neurological: Negative for tremors and numbness other than noted  Psychiatric/Behavioral: Negative for decreased concentration or agitation other than above       Objective:   Physical Exam BP 130/72   Pulse 82   Temp 98.2 F (36.8 C) (Oral)  Resp 20   Wt 198 lb (89.8 kg)   SpO2 92%   BMI 26.85 kg/m  VS noted,  Constitutional: Pt appears in no apparent distress HENT: Head: NCAT.  Right Ear: External ear normal.  Left Ear: External ear normal.  Eyes: . Pupils are equal, round, and reactive to light. Conjunctivae and EOM are normal Neck: Normal range of motion. Neck supple.  Cardiovascular: Normal rate and regular rhythm.   Pulmonary/Chest: Effort normal and breath sounds without rales or wheezing.  Abd:  Soft, NT, ND, + BS, no flank tender Neurological: Pt is alert. At baseline confused , motor grossly intact Skin: Skin is warm. No rash, no LE edema Psychiatric: Pt behavior is normal. No agitation.      Assessment & Plan:

## 2016-09-21 NOTE — Patient Instructions (Addendum)
Please continue all other medications as before, and refills have been done if requested.  Please have the pharmacy call with any other refills you may need.  Please keep your appointments with your specialists as you may have planned  You will be contacted regarding the referral for: thyroid ultrasound, and Neurology referral  Please go to the LAB in the Basement (turn left off the elevator) for the tests to be done today - just the urine testing today  You will be contacted by phone if any changes need to be made immediately.  Otherwise, you will receive a letter about your results with an explanation, but please check with MyChart first.  Please remember to sign up for MyChart if you have not done so, as this will be important to you in the future with finding out test results, communicating by private email, and scheduling acute appointments online when needed.  Please return in 3 months, or sooner if needed, with Lab testing done 3-5 days before

## 2016-09-21 NOTE — Progress Notes (Signed)
Pre visit review using our clinic review tool, if applicable. No additional management support is needed unless otherwise documented below in the visit note. 

## 2016-09-22 LAB — URINE CULTURE

## 2016-09-23 DIAGNOSIS — R739 Hyperglycemia, unspecified: Secondary | ICD-10-CM | POA: Insufficient documentation

## 2016-09-23 NOTE — Assessment & Plan Note (Addendum)
Asympt, for thyroid u/s, consider biopsy if indicated  Lab Results  Component Value Date   TSH 1.45 04/21/2016

## 2016-09-23 NOTE — Assessment & Plan Note (Signed)
Asympt, for f/u a1c,  to f/u any worsening symptoms or concerns

## 2016-09-23 NOTE — Assessment & Plan Note (Signed)
No evidence for UTI recent with neg cx, will repeat urine studies but tx pending results only

## 2016-09-23 NOTE — Assessment & Plan Note (Signed)
With worsening gait, risk of fall, no current tx, will refer neurology

## 2016-09-26 ENCOUNTER — Telehealth: Payer: Self-pay | Admitting: Internal Medicine

## 2016-09-26 ENCOUNTER — Ambulatory Visit: Payer: Medicare Other | Admitting: Internal Medicine

## 2016-09-26 DIAGNOSIS — R269 Unspecified abnormalities of gait and mobility: Secondary | ICD-10-CM

## 2016-09-26 NOTE — Telephone Encounter (Signed)
Pt request order for PT to help Mr. Preval due to multiple falls. Please advise

## 2016-09-26 NOTE — Telephone Encounter (Signed)
PT ordered.

## 2016-09-27 ENCOUNTER — Telehealth: Payer: Self-pay | Admitting: Internal Medicine

## 2016-09-27 NOTE — Telephone Encounter (Signed)
Referral was put into [facility] at [facility]. They will contact pt

## 2016-09-27 NOTE — Telephone Encounter (Signed)
Wife is requesting referral for PT to be entered as soon as possible b/c she is having difficulty trying to get him around by herself.

## 2016-09-29 ENCOUNTER — Encounter (HOSPITAL_COMMUNITY): Payer: Self-pay | Admitting: Emergency Medicine

## 2016-09-29 ENCOUNTER — Telehealth: Payer: Self-pay | Admitting: Internal Medicine

## 2016-09-29 ENCOUNTER — Telehealth: Payer: Self-pay | Admitting: Emergency Medicine

## 2016-09-29 ENCOUNTER — Emergency Department (HOSPITAL_COMMUNITY): Payer: Medicare Other

## 2016-09-29 ENCOUNTER — Emergency Department (HOSPITAL_COMMUNITY)
Admission: EM | Admit: 2016-09-29 | Discharge: 2016-09-29 | Disposition: A | Discharge status: Home / Self Care | Payer: Medicare Other | Attending: Emergency Medicine | Admitting: Emergency Medicine

## 2016-09-29 DIAGNOSIS — R531 Weakness: Secondary | ICD-10-CM | POA: Diagnosis not present

## 2016-09-29 DIAGNOSIS — G20 Parkinson's disease: Secondary | ICD-10-CM

## 2016-09-29 DIAGNOSIS — I5032 Chronic diastolic (congestive) heart failure: Secondary | ICD-10-CM | POA: Diagnosis not present

## 2016-09-29 DIAGNOSIS — Z8673 Personal history of transient ischemic attack (TIA), and cerebral infarction without residual deficits: Secondary | ICD-10-CM | POA: Diagnosis not present

## 2016-09-29 DIAGNOSIS — Z85828 Personal history of other malignant neoplasm of skin: Secondary | ICD-10-CM | POA: Insufficient documentation

## 2016-09-29 DIAGNOSIS — Z79899 Other long term (current) drug therapy: Secondary | ICD-10-CM | POA: Insufficient documentation

## 2016-09-29 DIAGNOSIS — I110 Hypertensive heart disease with heart failure: Secondary | ICD-10-CM | POA: Insufficient documentation

## 2016-09-29 DIAGNOSIS — F17210 Nicotine dependence, cigarettes, uncomplicated: Secondary | ICD-10-CM | POA: Insufficient documentation

## 2016-09-29 LAB — URINALYSIS, ROUTINE W REFLEX MICROSCOPIC
Glucose, UA: NEGATIVE mg/dL
Hgb urine dipstick: NEGATIVE
Ketones, ur: NEGATIVE mg/dL
NITRITE: NEGATIVE
PH: 6 (ref 5.0–8.0)
Protein, ur: 30 mg/dL — AB
SPECIFIC GRAVITY, URINE: 1.026 (ref 1.005–1.030)

## 2016-09-29 LAB — RAPID URINE DRUG SCREEN, HOSP PERFORMED
Amphetamines: NOT DETECTED
BARBITURATES: NOT DETECTED
BENZODIAZEPINES: POSITIVE — AB
COCAINE: NOT DETECTED
OPIATES: NOT DETECTED
TETRAHYDROCANNABINOL: NOT DETECTED

## 2016-09-29 LAB — CBC WITH DIFFERENTIAL/PLATELET
BASOS ABS: 0 10*3/uL (ref 0.0–0.1)
BASOS PCT: 0 %
EOS PCT: 3 %
Eosinophils Absolute: 0.3 10*3/uL (ref 0.0–0.7)
HCT: 45.4 % (ref 39.0–52.0)
Hemoglobin: 15.7 g/dL (ref 13.0–17.0)
LYMPHS PCT: 18 %
Lymphs Abs: 1.7 10*3/uL (ref 0.7–4.0)
MCH: 30.7 pg (ref 26.0–34.0)
MCHC: 34.6 g/dL (ref 30.0–36.0)
MCV: 88.7 fL (ref 78.0–100.0)
MONO ABS: 0.9 10*3/uL (ref 0.1–1.0)
MONOS PCT: 9 %
Neutro Abs: 6.9 10*3/uL (ref 1.7–7.7)
Neutrophils Relative %: 70 %
PLATELETS: 250 10*3/uL (ref 150–400)
RBC: 5.12 MIL/uL (ref 4.22–5.81)
RDW: 12.9 % (ref 11.5–15.5)
WBC: 9.8 10*3/uL (ref 4.0–10.5)

## 2016-09-29 LAB — URINE MICROSCOPIC-ADD ON: RBC / HPF: NONE SEEN RBC/hpf (ref 0–5)

## 2016-09-29 LAB — COMPREHENSIVE METABOLIC PANEL
ALBUMIN: 4.2 g/dL (ref 3.5–5.0)
ALT: 15 U/L — ABNORMAL LOW (ref 17–63)
AST: 18 U/L (ref 15–41)
Alkaline Phosphatase: 103 U/L (ref 38–126)
Anion gap: 4 — ABNORMAL LOW (ref 5–15)
BUN: 16 mg/dL (ref 6–20)
CHLORIDE: 106 mmol/L (ref 101–111)
CO2: 30 mmol/L (ref 22–32)
Calcium: 9.1 mg/dL (ref 8.9–10.3)
Creatinine, Ser: 1.01 mg/dL (ref 0.61–1.24)
GFR calc Af Amer: >60 mL/min (ref >60)
GFR calc non Af Amer: >60 mL/min (ref >60)
GLUCOSE: 104 mg/dL — AB (ref 65–99)
POTASSIUM: 3.7 mmol/L (ref 3.5–5.1)
SODIUM: 140 mmol/L (ref 135–145)
Total Bilirubin: 0.6 mg/dL (ref 0.3–1.2)
Total Protein: 7.2 g/dL (ref 6.5–8.1)

## 2016-09-29 LAB — I-STAT TROPONIN, ED: Troponin i, poc: 0 ng/mL (ref 0.00–0.08)

## 2016-09-29 NOTE — Telephone Encounter (Signed)
LEASE NOTE: All timestamps contained within this report are represented as [place] Standard Time. CONFIDENTIALTY NOTICE: This fax transmission is intended only for the addressee. It contains information that is legally privileged, confidential or otherwise protected from use or disclosure. If you are not the intended recipient, you are strictly prohibited from reviewing, disclosing, copying using or disseminating any of this information or taking any action in reliance on or regarding this information. If you have received this fax in error, please notify us immediately by telephone so that we can arrange for its return to [place]. Phone: [phone], Toll-Free: [phone], Fax: [phone] Page: 1 of 1 Call Id: [id number] [facility] - Client [place] Patient Name: [name] DOB: 1946-09-21 Initial Comment Caller says that her husband has Parkinson's dementia, and heart failure. And says his left arm is hurting. His BP 112/65, 66 He is responding but is a bit more slow than usual. Nurse Assessment Nurse: Dimas Chyle, RN, Dellis Filbert Date/Time Eilene Ghazi Time): 09/29/2016 11:02:09 AM Confirm and document reason for call. If symptomatic, describe symptoms. You must click the next button to save text entered. ---Caller says that her husband has Parkinson's dementia, and heart failure. And says his left arm is hurting. His BP 112/65, 66 He is responding but is a bit more slow than usual. EMS has evaluated patient and advised him to be seen in ED. Has the patient traveled out of the country within the last 30 days? ---No Does the patient have any new or worsening symptoms? ---Yes Will a triage be completed? ---Yes Related visit to physician within the last 2 weeks? ---No Does the PT have any chronic conditions? (i.e. diabetes, asthma, etc.) ---Yes List chronic conditions. ---Parkinson's dementia, CHF, HTN Is this a behavioral health  or substance abuse call? ---No Guidelines Guideline Title Affirmed Question Affirmed Notes Arm Pain [1] Age > 40 AND [2] no obvious cause AND [3] pain even when not moving the arm (Exception: pain is clearly made worse by moving arm or bending neck) Final Disposition User Go to ED Now Dimas Chyle, RN, Dellis Filbert Referrals [facility] - ED Disagree/Comply: Comply

## 2016-09-29 NOTE — Discharge Instructions (Signed)
You have been seen today for generalized weakness. Your imaging and lab tests showed no acute abnormalities. Follow up with PCP as soon as possible. Return to ED should symptoms worsen.  Take steps to protect against falls. Be sure to get plenty of fluids, food, and rest.

## 2016-09-29 NOTE — Telephone Encounter (Signed)
Spoke to patients wife. Patient is currently in the office.

## 2016-09-29 NOTE — Telephone Encounter (Signed)
Pts wife called and she is having to call EMS to help get him to the bathroom. She wants to know if there is anything else that can be done to help with this situation. She is asking for a wheelchair and maybe [place]. Please advise thanks.

## 2016-09-29 NOTE — Telephone Encounter (Signed)
A urinal at the chair or bedside would be helpful so he did not have to walk each time to [place]  [facility] for [facility] with Nurse and PT - I will order  Main thing is to see neurology to see about treatment  But if pt unable to ambulate well at all, he needs to go to ER.

## 2016-09-29 NOTE — ED Triage Notes (Addendum)
Per EMS patient comes from home for generalized weakness x week and having harder time getting around at home.  Patient has dementia and slurred speech and at patient's baseline per wife.

## 2016-09-29 NOTE — ED Provider Notes (Signed)
[facility] DEPT Provider Note   CSN: [id number] Arrival date & time: 09/29/16  1315     History   Chief Complaint Chief Complaint  Patient presents with  . generalized weakness    HPI [name] is a 70 y.o. male.  Level V caveat due to dementia.  HPI   [name] is a 70 y.o. male, with a history of dementia, Parkinson's, schizophrenia, and hypertension, presenting to the ED with increased generalized weakness for the last 3-4 days. Pt can usually walk with assistance from his wife, but recently patient has not been strong enough to ambulate. Also endorses decreased oral intake and decreased urine output. Has had three falls within the last week, with last fall yesterday.  Pt was treated for a UTI on Oct 10. Followed up with PCP on Oct 19 and urine was free from infection. Home health has been ordered for this patient, but they have not come to the house. Pt states, "My body just feels very heavy." Denies fever, cough, shortness of breath, chest pain, abdominal pain, N/V/D, or any other complaints.   [name], wife, is at the bedside. Denies anticoagulation. Wife states that the home health company called her today and assured her they will have a home health person out in the next few days.  Past Medical History:  Diagnosis Date  . Bipolar affective disorder ([place])   . BPH (benign prostatic hyperplasia)   . Chronic anxiety   . Chronic constipation   . Dementia   . Depression   . ED (erectile dysfunction) of organic origin   . Elevated PSA   . Generalized weakness    LOWER EXTREMITIES  . History of acute pyelonephritis    admission 07-19-2015  . History of bladder stone   . 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 History of nonmelanoma skin cancer    EXCISION OF NOSE  . Hyperlipidemia   . Hypertension   . Lower urinary tract symptoms (LUTS)   . Occasional tremors    leg tremors  . Other degenerative diseases of the  basal ganglia    S/P CVA 2011--  CHRONIC LEFT BASAL GANGLIA LACUNAR INFARCT  PER CT  . Schizophrenia ([place])    HX PSYCHIATRIC ADMISSION'S  AND ECT ([place]) [place]  . Urinary bladder stone   . Vitamin B12 deficiency     Patient Active Problem List   Diagnosis Date Noted  . Hyperglycemia 09/23/2016  . Dark urine 09/21/2016  . Thyroid nodule 09/21/2016  . Gait instability   . TIA (transient ischemic attack) 12/02/2015  . BPH with urinary obstruction 08/13/2015  . Acute pyelonephritis-culture negative s/p multiple rounds Abx 07/21/2015  . Cystitis 07/20/2015  . Obstructed, uropathy 07/20/2015  . History of CVA (cerebrovascular accident) 07/20/2015  . NSVT (nonsustained ventricular tachycardia) ([place]) 07/20/2015  . Chronic pain syndrome 06/10/2015  . Nausea without vomiting 06/09/2015  . Abnormal breath sounds 12/15/2014  . Parkinson's disease ([place]) 11/25/2014  . Vitamin B 12 deficiency 11/25/2014  . Ataxia 11/23/2014  . Chronic diastolic CHF (congestive heart failure) ([place]) 08/17/2014  . Hypokalemia 08/16/2014  . Weakness generalized 08/15/2014  . Urinary bladder stone 08/12/2014  . Gait disorder 08/12/2014  . Chronic nausea 12/02/2013  . Urinary incontinence 12/02/2013  . Calculus of bladder 10/03/2013  . Chest pain 06/10/2013  . Peripheral edema 06/10/2013  . Dyspepsia 12/24/2012  . Bladder neck obstruction 12/24/2012  . Glucose intolerance (impaired glucose tolerance) 06/27/2012  .  Bipolar affective disorder ([place]) 06/27/2012  . Tremor 06/27/2012  . Skin lesion 06/27/2012  . Erectile dysfunction 06/27/2012  . OAB (overactive bladder) 06/27/2012  . Encounter for preventative adult health care exam with abnormal findings 06/22/2012  . Depression 06/22/2012  . GERD (gastroesophageal reflux disease) 06/22/2012  . Fatty liver 06/22/2012  . Chronic headaches 06/22/2012  . Stroke ([place])   . Anxiety   . Skin cancer of nose   . Rectal polyp   . Chest pain 02/01/2012  . BPH (benign  prostatic hyperplasia) 02/01/2012  . Dementia   . HTN (hypertension)   . Vitamin B12 deficiency   . Schizophrenia ([place])   . Hyperlipidemia     Past Surgical History:  Procedure Laterality Date  . CARDIOVASCULAR STRESS TEST  06-18-2013  DR Jenkins [place]   LOW RISK NO EXERCISE LEXISCAN STUDY/  NO ISCHEMIA, CANNOT RULE OUT BASAL INFERIOR INFARCTION BUT POSSIBLE ARTIFACT GIVEN NORMAL WALL MOTION/  EF 73%  . CYSTOSCOPY WITH LITHOLAPAXY N/A 10/03/2013   Procedure: CYSTOSCOPY WITH LITHOLAPAXY;  Surgeon: Claybon Jabs, MD;  Location: [facility];  Service: Urology;  Laterality: N/A;  . CYSTOSCOPY WITH LITHOLAPAXY N/A 08/13/2015   Procedure: CYSTOSCOPY WITH LITHOLAPAXY;  Surgeon: Kathie Rhodes, MD;  Location: [facility];  Service: Urology;  Laterality: N/A;  . EXCISION TUMOR UPPER LEFT THIGH  2012  . HOLMIUM LASER APPLICATION N/A [id number]   Procedure: HOLMIUM LASER APPLICATION;  Surgeon: Claybon Jabs, MD;  Location: [facility];  Service: Urology;  Laterality: N/A;  . HOLMIUM LASER APPLICATION N/A [id number]   Procedure: HOLMIUM LASER APPLICATION;  Surgeon: Kathie Rhodes, MD;  Location: [facility];  Service: Urology;  Laterality: N/A;  . TRANSANAL EXCISION ANAL AND RECTAL POLYPS  11-02-2000  . TRANSTHORACIC ECHOCARDIOGRAM  08-16-2014   Mild LVH/ grade I diastolic dysfunction/  ef 60-65%/  mild AV calification without stenosis/  mild RAE  . TRANSURETHRAL INCISION OF PROSTATE N/A 10/03/2013   Procedure: TRANSURETHRAL INCISION OF THE PROSTATE (TUIP);  Surgeon: Claybon Jabs, MD;  Location: [facility];  Service: Urology;  Laterality: N/A;  . TRANSURETHRAL RESECTION OF PROSTATE N/A 08/13/2015   Procedure: TRANSURETHRAL RESECTION OF THE PROSTATE (TURP);  Surgeon: Kathie Rhodes, MD;  Location: [facility];  Service: Urology;  Laterality: N/A;       Home Medications    Prior to Admission medications     Medication Sig Start Date End Date Taking? Authorizing Provider  ALPRAZolam Duanne Moron) 0.5 MG tablet TAKE 1 TABLET BY MOUTH THREE TIMES DAILY FOR ANXIETY 09/19/16  Yes Biagio Borg, MD  amLODipine (NORVASC) 5 MG tablet TAKE 1 TABLET BY MOUTH EVERY DAY Patient taking differently: TAKE 1 TABLET BY MOUTH EVERY MORNING 08/15/16  Yes Biagio Borg, MD  carbidopa-levodopa (SINEMET IR) 25-100 MG tablet TAKE 1 TABLET BY MOUTH FOUR TIMES DAILY Patient taking differently: TAKE 1 TABLET BY MOUTH THREE TIMES A DAY 09/06/16  Yes Biagio Borg, MD  ENSURE (ENSURE) Take 237 mLs by mouth 2 (two) times daily between meals.   Yes Historical Provider, MD  Flaxseed, Linseed, (FLAX SEED OIL) 1000 MG CAPS Take 2,000 mg by mouth daily with breakfast.   Yes Historical Provider, MD  Lactobacillus Rhamnosus, GG, (CULTURELLE) CAPS Take 1 capsule by mouth daily.   Yes Historical Provider, MD  mirtazapine (REMERON) 15 MG tablet TAKE 1 TABLET(15 MG) BY MOUTH AT BEDTIME 07/06/16  Yes Biagio Borg, MD  Omega-3  Fatty Acids (FISH OIL) 1200 MG CAPS Take 3 capsules by mouth daily.   Yes Historical Provider, MD  prochlorperazine (COMPAZINE) 5 MG tablet TAKE 1 TABLET BY MOUTH EVERY 6-8 HOURS AS NEEDED FOR NAUSEA Patient taking differently: Take 5 mg by mouth every 6 (six) hours as needed for nausea or vomiting.  03/15/16  Yes Amy S Esterwood, PA-C  traMADol (ULTRAM) 50 MG tablet Take 1 tablet (50 mg total) by mouth every 8 (eight) hours as needed. Patient taking differently: Take 50 mg by mouth every 8 (eight) hours as needed for moderate pain.  09/05/16  Yes Biagio Borg, MD  vitamin B-12 1000 MCG tablet Take 1 tablet (1,000 mcg total) by mouth daily. 08/17/14  Yes Janece Canterbury, MD  alum & mag hydroxide-simeth (MAALOX/MYLANTA) 200-200-20 MG/5ML suspension Take 30 mLs by mouth every 6 (six) hours as needed for indigestion or heartburn.    Historical Provider, MD  atorvastatin (LIPITOR) 10 MG tablet Take 1 tablet (10 mg total) by mouth  daily. Patient not taking: Reported on 09/29/2016 04/21/16   Biagio Borg, MD  cephALEXin (KEFLEX) 500 MG capsule Take 1 capsule (500 mg total) by mouth 3 (three) times daily. Patient not taking: Reported on 09/29/2016 09/13/16   Tatyana Kirichenko, PA-C  influenza vac recom quadrivalent (FLUBLOK) 0.5 ML injection Inject 0.5 mLs into the muscle once.    Historical Provider, MD  pantoprazole (PROTONIX) 40 MG tablet Take 1 tablet (40 mg total) by mouth daily. Patient not taking: Reported on 09/29/2016 06/09/15   Biagio Borg, MD    Family History Family History  Problem Relation Age of Onset  . Dementia Father   . Dementia Mother   . Colon cancer Neg Hx   . Colon polyps Neg Hx   . Rectal cancer Neg Hx   . Stomach cancer Neg Hx     Social History Social History  Substance Use Topics  . Smoking status: Current Every Day Smoker    Packs/day: 0.10    Years: 48.00    Types: Cigarettes  . Smokeless tobacco: Never Used  . Alcohol use No     Allergies   Review of patient's allergies indicates no known allergies.   Review of Systems Review of Systems  Unable to perform ROS: Dementia  Constitutional: Negative for fever.  Respiratory: Negative for cough and shortness of breath.   Gastrointestinal: Negative for abdominal pain, blood in stool, constipation, diarrhea, nausea and vomiting.  Neurological: Positive for weakness (generalized).  All other systems reviewed and are negative.    Physical Exam Updated Vital Signs BP 135/90 (BP Location: Left Arm)   Pulse 98   Temp 98.6 F (37 C) (Oral)   Resp 16   SpO2 95%   Physical Exam  Constitutional: He appears well-developed and well-nourished. No distress.  HENT:  Head: Normocephalic and atraumatic.  Eyes: Conjunctivae and EOM are normal. Pupils are equal, round, and reactive to light.  Neck: Neck supple.  Cardiovascular: Normal rate, regular rhythm, normal heart sounds and intact distal pulses.   Pulmonary/Chest: Effort  normal and breath sounds normal. No respiratory distress.  Abdominal: Soft. There is no tenderness. There is no guarding.  Musculoskeletal: He exhibits no edema or tenderness.  Motor intact in all four extremities. No midline spinal tenderness.   Lymphadenopathy:    He has no cervical adenopathy.  Neurological: He is alert.  No sensory deficits. Strength 5/5 in all extremities. Coordination intact. Cranial nerves III-XII grossly intact. No facial droop.  Skin: Skin is warm and dry. He is not diaphoretic.  No rashes, lesions, or signs of infection noted.  Psychiatric: He has a normal mood and affect. His behavior is normal.  Nursing note and vitals reviewed.    ED Treatments / Results  Labs (all labs ordered are listed, but only abnormal results are displayed) Labs Reviewed  COMPREHENSIVE METABOLIC PANEL - Abnormal; Notable for the following:       Result Value   Glucose, Bld 104 (*)    ALT 15 (*)    Anion gap 4 (*)    All other components within normal limits  URINALYSIS, ROUTINE W REFLEX MICROSCOPIC (NOT AT [facility]) - Abnormal; Notable for the following:    Color, Urine AMBER (*)    APPearance CLOUDY (*)    Bilirubin Urine SMALL (*)    Protein, ur 30 (*)    Leukocytes, UA SMALL (*)    All other components within normal limits  RAPID URINE DRUG SCREEN, HOSP PERFORMED - Abnormal; Notable for the following:    Benzodiazepines POSITIVE (*)    All other components within normal limits  URINE MICROSCOPIC-ADD ON - Abnormal; Notable for the following:    Squamous Epithelial / LPF 0-5 (*)    Bacteria, UA FEW (*)    Crystals CA OXALATE CRYSTALS (*)    All other components within normal limits  URINE CULTURE  CBC WITH DIFFERENTIAL/PLATELET  CBC WITH DIFFERENTIAL/PLATELET  Randolm Idol, ED    EKG  EKG Interpretation None       Radiology Dg Chest 2 View  Result Date: 09/29/2016 CLINICAL DATA:  Weakness for 1 week. EXAM: CHEST  2 VIEW COMPARISON:  12/02/2015 FINDINGS:  Heart and mediastinal contours are within normal limits. No focal opacities or effusions. No acute bony abnormality. IMPRESSION: No active cardiopulmonary disease. Electronically Signed   By: Rolm Baptise M.D.   On: 09/29/2016 16:50    Procedures Procedures (including critical care time)  Medications Ordered in ED Medications - No data to display   Initial Impression / Assessment and Plan / ED Course  I have reviewed the triage vital signs and the nursing notes.  Pertinent labs & imaging results that were available during my care of the patient were reviewed by me and considered in my medical decision making (see chart for details).  Clinical Course    Patient presents with generalized weakness for the last 3-4 days. Patient is nontoxic appearing, afebrile, not tachycardic, not tachypneic, not hypotensive, maintains adequate SPO2 on room air, and is in no apparent distress. Patient has no signs of sepsis or other serious or life-threatening condition.    Patient meets no admission criteria at this time. Patient's wife already has family coming up to stay with her for the next week who can help with the patient's care until home health can begin assisting the patient. Therefore, patient will have adequate support at home. Patient to follow-up with his PCP on this matter.  Findings and plan of care discussed with Davonna Belling, MD. Dr. Alvino Chapel personally evaluated and examined this patient.   Vitals:   09/29/16 1318 09/29/16 1328 09/29/16 1741 09/29/16 1805  BP:  135/90 136/95 (!) 150/102  Pulse:  98 86 93  Resp:  16 12 13   Temp:  98.6 F (37 C)    TempSrc:  Oral    SpO2: 96% 95% 96% 99%     Final Clinical Impressions(s) / ED Diagnoses   Final diagnoses:  Generalized weakness  New Prescriptions New Prescriptions   No medications on file     Lorayne Bender, Hershal Coria 09/29/16 [place], MD 10/04/16 [phone]

## 2016-09-30 LAB — URINE CULTURE

## 2016-10-02 NOTE — Telephone Encounter (Signed)
Melissa from [facility] asked "We are not able to staff the nursing at this time. We could go ahead and get started with PT and add nursing later if necessary. Would that be ok?" and "As far as the bedside commode goes, please have the provider put a "For Home Use Only DME" order into Epic for that"  Please advise

## 2016-10-02 NOTE — Addendum Note (Signed)
Addended by: Biagio Borg on: 10/02/2016 01:03 PM   Modules accepted: Orders

## 2016-10-02 NOTE — Telephone Encounter (Signed)
Bedside commode ordered  [facility] for PT now, and nurse later

## 2016-10-05 DIAGNOSIS — R531 Weakness: Secondary | ICD-10-CM | POA: Diagnosis not present

## 2016-10-05 DIAGNOSIS — R2689 Other abnormalities of gait and mobility: Secondary | ICD-10-CM | POA: Diagnosis not present

## 2016-10-09 ENCOUNTER — Telehealth: Payer: Self-pay | Admitting: Internal Medicine

## 2016-10-09 DIAGNOSIS — R531 Weakness: Secondary | ICD-10-CM | POA: Diagnosis not present

## 2016-10-09 DIAGNOSIS — R2689 Other abnormalities of gait and mobility: Secondary | ICD-10-CM | POA: Diagnosis not present

## 2016-10-09 NOTE — Telephone Encounter (Signed)
faxed

## 2016-10-09 NOTE — Telephone Encounter (Signed)
Calling to get verbals for PT  1 week 1 (last week) 4 week 2 (starting this week)   OT/ Home health aide 2 week 2   She also wanted to have a prescription for a bedside commode faxed to [facility]

## 2016-10-09 NOTE — Telephone Encounter (Signed)
All verbals ok  Rx for [facility] done hardcopy to corinne

## 2016-10-11 ENCOUNTER — Other Ambulatory Visit: Payer: Self-pay | Admitting: Internal Medicine

## 2016-10-11 DIAGNOSIS — R531 Weakness: Secondary | ICD-10-CM | POA: Diagnosis not present

## 2016-10-11 DIAGNOSIS — R2689 Other abnormalities of gait and mobility: Secondary | ICD-10-CM | POA: Diagnosis not present

## 2016-10-17 DIAGNOSIS — R2689 Other abnormalities of gait and mobility: Secondary | ICD-10-CM | POA: Diagnosis not present

## 2016-10-17 DIAGNOSIS — R531 Weakness: Secondary | ICD-10-CM | POA: Diagnosis not present

## 2016-10-19 DIAGNOSIS — R2689 Other abnormalities of gait and mobility: Secondary | ICD-10-CM | POA: Diagnosis not present

## 2016-10-19 DIAGNOSIS — R531 Weakness: Secondary | ICD-10-CM | POA: Diagnosis not present

## 2016-10-24 DIAGNOSIS — R531 Weakness: Secondary | ICD-10-CM | POA: Diagnosis not present

## 2016-10-24 DIAGNOSIS — R2689 Other abnormalities of gait and mobility: Secondary | ICD-10-CM | POA: Diagnosis not present

## 2016-10-26 DIAGNOSIS — R531 Weakness: Secondary | ICD-10-CM | POA: Diagnosis not present

## 2016-10-26 DIAGNOSIS — R2689 Other abnormalities of gait and mobility: Secondary | ICD-10-CM | POA: Diagnosis not present

## 2016-11-02 DIAGNOSIS — R531 Weakness: Secondary | ICD-10-CM | POA: Diagnosis not present

## 2016-11-02 DIAGNOSIS — R2689 Other abnormalities of gait and mobility: Secondary | ICD-10-CM | POA: Diagnosis not present

## 2016-11-15 ENCOUNTER — Other Ambulatory Visit: Payer: Self-pay | Admitting: Internal Medicine

## 2016-12-03 ENCOUNTER — Emergency Department (HOSPITAL_COMMUNITY): Payer: Medicare Other

## 2016-12-03 ENCOUNTER — Encounter (HOSPITAL_COMMUNITY): Payer: Self-pay | Admitting: Nurse Practitioner

## 2016-12-03 ENCOUNTER — Emergency Department (HOSPITAL_COMMUNITY)
Admission: EM | Admit: 2016-12-03 | Discharge: 2016-12-03 | Disposition: A | Discharge status: Home / Self Care | Payer: Medicare Other | Attending: Emergency Medicine | Admitting: Emergency Medicine

## 2016-12-03 DIAGNOSIS — R2689 Other abnormalities of gait and mobility: Secondary | ICD-10-CM | POA: Insufficient documentation

## 2016-12-03 DIAGNOSIS — R531 Weakness: Secondary | ICD-10-CM | POA: Diagnosis not present

## 2016-12-03 DIAGNOSIS — I110 Hypertensive heart disease with heart failure: Secondary | ICD-10-CM | POA: Insufficient documentation

## 2016-12-03 DIAGNOSIS — Z79899 Other long term (current) drug therapy: Secondary | ICD-10-CM | POA: Diagnosis not present

## 2016-12-03 DIAGNOSIS — G20 Parkinson's disease: Secondary | ICD-10-CM | POA: Insufficient documentation

## 2016-12-03 DIAGNOSIS — Z8673 Personal history of transient ischemic attack (TIA), and cerebral infarction without residual deficits: Secondary | ICD-10-CM | POA: Diagnosis not present

## 2016-12-03 DIAGNOSIS — I5032 Chronic diastolic (congestive) heart failure: Secondary | ICD-10-CM | POA: Insufficient documentation

## 2016-12-03 DIAGNOSIS — R05 Cough: Secondary | ICD-10-CM | POA: Diagnosis not present

## 2016-12-03 DIAGNOSIS — R269 Unspecified abnormalities of gait and mobility: Secondary | ICD-10-CM

## 2016-12-03 DIAGNOSIS — R791 Abnormal coagulation profile: Secondary | ICD-10-CM | POA: Diagnosis not present

## 2016-12-03 DIAGNOSIS — F17210 Nicotine dependence, cigarettes, uncomplicated: Secondary | ICD-10-CM | POA: Insufficient documentation

## 2016-12-03 DIAGNOSIS — R935 Abnormal findings on diagnostic imaging of other abdominal regions, including retroperitoneum: Secondary | ICD-10-CM | POA: Diagnosis not present

## 2016-12-03 DIAGNOSIS — R933 Abnormal findings on diagnostic imaging of other parts of digestive tract: Secondary | ICD-10-CM | POA: Insufficient documentation

## 2016-12-03 DIAGNOSIS — R404 Transient alteration of awareness: Secondary | ICD-10-CM | POA: Diagnosis not present

## 2016-12-03 LAB — URINALYSIS, ROUTINE W REFLEX MICROSCOPIC
Bilirubin Urine: NEGATIVE
Glucose, UA: NEGATIVE mg/dL
Hgb urine dipstick: NEGATIVE
KETONES UR: 5 mg/dL — AB
LEUKOCYTES UA: NEGATIVE
NITRITE: NEGATIVE
PROTEIN: NEGATIVE mg/dL
Specific Gravity, Urine: 1.016 (ref 1.005–1.030)
pH: 7 (ref 5.0–8.0)

## 2016-12-03 LAB — CBC WITH DIFFERENTIAL/PLATELET
BASOS ABS: 0 10*3/uL (ref 0.0–0.1)
BASOS PCT: 0 %
EOS ABS: 0.1 10*3/uL (ref 0.0–0.7)
Eosinophils Relative: 1 %
HCT: 45.9 % (ref 39.0–52.0)
HEMOGLOBIN: 16.1 g/dL (ref 13.0–17.0)
LYMPHS ABS: 1 10*3/uL (ref 0.7–4.0)
Lymphocytes Relative: 12 %
MCH: 30.7 pg (ref 26.0–34.0)
MCHC: 35.1 g/dL (ref 30.0–36.0)
MCV: 87.4 fL (ref 78.0–100.0)
Monocytes Absolute: 0.5 10*3/uL (ref 0.1–1.0)
Monocytes Relative: 6 %
NEUTROS PCT: 81 %
Neutro Abs: 7.3 10*3/uL (ref 1.7–7.7)
Platelets: 229 10*3/uL (ref 150–400)
RBC: 5.25 MIL/uL (ref 4.22–5.81)
RDW: 12.7 % (ref 11.5–15.5)
WBC: 9 10*3/uL (ref 4.0–10.5)

## 2016-12-03 LAB — COMPREHENSIVE METABOLIC PANEL
ALK PHOS: 101 U/L (ref 38–126)
ALT: <5 U/L — ABNORMAL LOW (ref 17–63)
ANION GAP: 8 (ref 5–15)
AST: 16 U/L (ref 15–41)
Albumin: 4.3 g/dL (ref 3.5–5.0)
BUN: 16 mg/dL (ref 6–20)
CALCIUM: 8.9 mg/dL (ref 8.9–10.3)
CO2: 26 mmol/L (ref 22–32)
Chloride: 104 mmol/L (ref 101–111)
Creatinine, Ser: 0.82 mg/dL (ref 0.61–1.24)
GFR calc non Af Amer: >60 mL/min (ref >60)
Glucose, Bld: 99 mg/dL (ref 65–99)
Potassium: 3.7 mmol/L (ref 3.5–5.1)
SODIUM: 138 mmol/L (ref 135–145)
Total Bilirubin: 0.7 mg/dL (ref 0.3–1.2)
Total Protein: 6.9 g/dL (ref 6.5–8.1)

## 2016-12-03 LAB — TROPONIN I: Troponin I: <0.03 ng/mL (ref <0.03)

## 2016-12-03 LAB — PROTIME-INR
INR: 1.09
PROTHROMBIN TIME: 14.1 s (ref 11.4–15.2)

## 2016-12-03 LAB — I-STAT CG4 LACTIC ACID, ED: LACTIC ACID, VENOUS: 1.05 mmol/L (ref 0.5–1.9)

## 2016-12-03 LAB — CK: CK TOTAL: 26 U/L — AB (ref 49–397)

## 2016-12-03 MED ORDER — ONDANSETRON HCL 4 MG/2ML IJ SOLN
4.0000 mg | Freq: Once | INTRAMUSCULAR | Status: AC
  Administered 2016-12-03: 4 mg via INTRAVENOUS
  Filled 2016-12-03: qty 2

## 2016-12-03 MED ORDER — SODIUM CHLORIDE 0.9 % IV SOLN
Freq: Once | INTRAVENOUS | Status: AC
  Administered 2016-12-03: 15:00:00 via INTRAVENOUS

## 2016-12-03 NOTE — ED Triage Notes (Signed)
Pt presents to [facility] from home with complaints of worsening bilateral lower extremity weakness over past 2 days. This morning his wife was assisting him to bathroom and he became increasingly weak which concerned them to call 911. EMS reports on site stroke screen was negative. Other associated symptoms include: cough without fever, urinary frequency, and dark urine. PMH includes dementia. EMS reports alert and oriented except to date. Accompanied today by his wife.

## 2016-12-03 NOTE — ED Provider Notes (Signed)
[facility] DEPT Provider Note   CSN: [id number] Arrival date & time: 12/03/16  1256     History   Chief Complaint Chief Complaint  Patient presents with  . Weakness    HPI [name] is a 70 y.o. male.  HPI Patient has multiple medical comorbidities particularly Parkinson's disease. His wife reports she is usually able to help and manage him quite well in the home. Over the past 2 days however he is shown a steady decline. Symptoms began when she was helping him in the restroom and he developed a very blank stare and leaned back against the wall and started to slowly collapse. She reports she was able at that time to assist him to the couch and there was no fall associated. Since that time however he is getting increasingly difficult to move. She reports his legs seemed weak and he is extremely stiff like trying to move a brick wall. She reports his speech is becoming very difficult to understand. She reports if she can get him to a standing position he just seems to sway a bit and doesn't know how to get moving. No documented fevers or chills. She reports just as of yesterday some minimal amount of coughing but otherwise patient is been at baseline health. No medication changes for well over 6 months. Past Medical History:  Diagnosis Date  . Bipolar affective disorder ([place])   . BPH (benign prostatic hyperplasia)   . Chronic anxiety   . Chronic constipation   . Dementia   . Depression   . ED (erectile dysfunction) of organic origin   . Elevated PSA   . Generalized weakness    LOWER EXTREMITIES  . History of acute pyelonephritis    admission 07-19-2015  . History of bladder stone   . 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 History of nonmelanoma skin cancer    EXCISION OF NOSE  . Hyperlipidemia   . Hypertension   . Lower urinary tract symptoms (LUTS)   . Occasional tremors    leg tremors  . Other degenerative diseases of the  basal ganglia    S/P CVA 2011--  CHRONIC LEFT BASAL GANGLIA LACUNAR INFARCT  PER CT  . Schizophrenia ([place])    HX PSYCHIATRIC ADMISSION'S  AND ECT ([place]) [place]  . Urinary bladder stone   . Vitamin B12 deficiency     Patient Active Problem List   Diagnosis Date Noted  . Hyperglycemia 09/23/2016  . Dark urine 09/21/2016  . Thyroid nodule 09/21/2016  . Gait instability   . TIA (transient ischemic attack) 12/02/2015  . BPH with urinary obstruction 08/13/2015  . Acute pyelonephritis-culture negative s/p multiple rounds Abx 07/21/2015  . Cystitis 07/20/2015  . Obstructed, uropathy 07/20/2015  . History of CVA (cerebrovascular accident) 07/20/2015  . NSVT (nonsustained ventricular tachycardia) ([place]) 07/20/2015  . Chronic pain syndrome 06/10/2015  . Nausea without vomiting 06/09/2015  . Abnormal breath sounds 12/15/2014  . Parkinson's disease ([place]) 11/25/2014  . Vitamin B 12 deficiency 11/25/2014  . Ataxia 11/23/2014  . Chronic diastolic CHF (congestive heart failure) ([place]) 08/17/2014  . Hypokalemia 08/16/2014  . Weakness generalized 08/15/2014  . Urinary bladder stone 08/12/2014  . Gait disorder 08/12/2014  . Chronic nausea 12/02/2013  . Urinary incontinence 12/02/2013  . Calculus of bladder 10/03/2013  . Chest pain 06/10/2013  . Peripheral edema 06/10/2013  . Dyspepsia 12/24/2012  . Bladder neck obstruction 12/24/2012  . Glucose intolerance (impaired glucose tolerance)  06/27/2012  . Bipolar affective disorder ([place]) 06/27/2012  . Tremor 06/27/2012  . Skin lesion 06/27/2012  . Erectile dysfunction 06/27/2012  . OAB (overactive bladder) 06/27/2012  . Encounter for preventative adult health care exam with abnormal findings 06/22/2012  . Depression 06/22/2012  . GERD (gastroesophageal reflux disease) 06/22/2012  . Fatty liver 06/22/2012  . Chronic headaches 06/22/2012  . Stroke ([place])   . Anxiety   . Skin cancer of nose   . Rectal polyp   . Chest pain 02/01/2012  . BPH (benign  prostatic hyperplasia) 02/01/2012  . Dementia   . HTN (hypertension)   . Vitamin B12 deficiency   . Schizophrenia ([place])   . Hyperlipidemia     Past Surgical History:  Procedure Laterality Date  . CARDIOVASCULAR STRESS TEST  06-18-2013  DR Jenkins [place]   LOW RISK NO EXERCISE LEXISCAN STUDY/  NO ISCHEMIA, CANNOT RULE OUT BASAL INFERIOR INFARCTION BUT POSSIBLE ARTIFACT GIVEN NORMAL WALL MOTION/  EF 73%  . CYSTOSCOPY WITH LITHOLAPAXY N/A 10/03/2013   Procedure: CYSTOSCOPY WITH LITHOLAPAXY;  Surgeon: Claybon Jabs, MD;  Location: [facility];  Service: Urology;  Laterality: N/A;  . CYSTOSCOPY WITH LITHOLAPAXY N/A 08/13/2015   Procedure: CYSTOSCOPY WITH LITHOLAPAXY;  Surgeon: Kathie Rhodes, MD;  Location: [facility];  Service: Urology;  Laterality: N/A;  . EXCISION TUMOR UPPER LEFT THIGH  2012  . HOLMIUM LASER APPLICATION N/A [id number]   Procedure: HOLMIUM LASER APPLICATION;  Surgeon: Claybon Jabs, MD;  Location[facility];  Service: Urology;  Laterality: N/A;  . HOLMIUM LASER APPLICATION N/A [id number]   Procedure: HOLMIUM LASER APPLICATION;  Surgeon: Kathie Rhodes, MD;  Location: [facility];  Service: Urology;  Laterality: N/A;  . TRANSANAL EXCISION ANAL AND RECTAL POLYPS  11-02-2000  . TRANSTHORACIC ECHOCARDIOGRAM  08-16-2014   Mild LVH/ grade I diastolic dysfunction/  ef 60-65%/  mild AV calification without stenosis/  mild RAE  . TRANSURETHRAL INCISION OF PROSTATE N/A 10/03/2013   Procedure: TRANSURETHRAL INCISION OF THE PROSTATE (TUIP);  Surgeon: Claybon Jabs, MD;  Location[facility];  Service: Urology;  Laterality: N/A;  . TRANSURETHRAL RESECTION OF PROSTATE N/A 08/13/2015   Procedure: TRANSURETHRAL RESECTION OF THE PROSTATE (TURP);  Surgeon: Kathie Rhodes, MD;  Location: [facility];  Service: Urology;  Laterality: N/A;       Home Medications    Prior to Admission medications     Medication Sig Start Date End Date Taking? Authorizing Provider  ALPRAZolam Duanne Moron) 0.5 MG tablet TAKE 1 TABLET BY MOUTH THREE TIMES DAILY FOR ANXIETY 09/19/16  Yes Biagio Borg, MD  alum & mag hydroxide-simeth (MAALOX/MYLANTA) 200-200-20 MG/5ML suspension Take 30 mLs by mouth every 6 (six) hours as needed for indigestion or heartburn.   Yes Historical Provider, MD  amLODipine (NORVASC) 5 MG tablet TAKE 1 TABLET BY MOUTH EVERY DAY 11/15/16  Yes Biagio Borg, MD  carbidopa-levodopa (SINEMET IR) 25-100 MG tablet TAKE 1 TABLET BY MOUTH FOUR TIMES DAILY Patient taking differently: TAKE 1 TABLET BY MOUTH THREE TIMES DAILY 10/11/16  Yes Biagio Borg, MD  Flaxseed, Linseed, (FLAX SEED OIL) 1000 MG CAPS Take 2,000 mg by mouth daily with breakfast.   Yes Historical Provider, MD  mirtazapine (REMERON) 15 MG tablet TAKE 1 TABLET(15 MG) BY MOUTH AT BEDTIME 07/06/16  Yes Biagio Borg, MD  Omega-3 Fatty Acids (FISH OIL) 1200 MG CAPS Take 3 capsules by mouth daily.   Yes  Historical Provider, MD  prochlorperazine (COMPAZINE) 5 MG tablet TAKE 1 TABLET BY MOUTH EVERY 6-8 HOURS AS NEEDED FOR NAUSEA Patient taking differently: Take 5 mg by mouth every 6 (six) hours as needed for nausea or vomiting.  03/15/16  Yes Amy S Esterwood, PA-C  saccharomyces boulardii (FLORASTOR) 250 MG capsule Take 250 mg by mouth daily.   Yes Historical Provider, MD  traMADol (ULTRAM) 50 MG tablet Take 1 tablet (50 mg total) by mouth every 8 (eight) hours as needed. Patient taking differently: Take 50 mg by mouth every 8 (eight) hours as needed for moderate pain.  09/05/16  Yes Biagio Borg, MD  vitamin B-12 1000 MCG tablet Take 1 tablet (1,000 mcg total) by mouth daily. 08/17/14  Yes Janece Canterbury, MD  atorvastatin (LIPITOR) 10 MG tablet Take 1 tablet (10 mg total) by mouth daily. Patient not taking: Reported on 09/29/2016 04/21/16   Biagio Borg, MD  cephALEXin (KEFLEX) 500 MG capsule Take 1 capsule (500 mg total) by mouth 3 (three) times  daily. Patient not taking: Reported on 09/29/2016 09/13/16   Tatyana Kirichenko, PA-C  ENSURE (ENSURE) Take 237 mLs by mouth 2 (two) times daily between meals.    Historical Provider, MD  influenza vac recom quadrivalent (FLUBLOK) 0.5 ML injection Inject 0.5 mLs into the muscle once.    Historical Provider, MD  pantoprazole (PROTONIX) 40 MG tablet Take 1 tablet (40 mg total) by mouth daily. Patient not taking: Reported on 09/29/2016 06/09/15   Biagio Borg, MD    Family History Family History  Problem Relation Age of Onset  . Dementia Father   . Dementia Mother   . Colon cancer Neg Hx   . Colon polyps Neg Hx   . Rectal cancer Neg Hx   . Stomach cancer Neg Hx     Social History Social History  Substance Use Topics  . Smoking status: Current Every Day Smoker    Packs/day: 0.10    Years: 48.00    Types: Cigarettes  . Smokeless tobacco: Never Used  . Alcohol use No     Allergies   Patient has no known allergies.   Review of Systems Review of Systems 10 Systems reviewed and are negative for acute change except as noted in the HPI.   Physical Exam Updated Vital Signs BP 149/95   Pulse 97   Temp 98.6 F (37 C) (Oral)   Resp 17   SpO2 93%   Physical Exam  Constitutional:  Patient does not show distress. No respiratory distress color is good. He is sitting up in the stretcher and has a somewhat fixed and slightly blank gaze. He responds to conversation and stimuli but all responses seemed quite slow.  HENT:  Head: Normocephalic and atraumatic.  Nose: Nose normal.  Mouth/Throat: Oropharynx is clear and moist.  Eyes: Pupils are equal, round, and reactive to light.  Neck: No thyromegaly present.  Cardiovascular: Normal rate, regular rhythm, normal heart sounds and intact distal pulses.   Pulmonary/Chest: Effort normal and breath sounds normal.  Abdominal: Soft. He exhibits no distension. There is no tenderness. There is no guarding.  Musculoskeletal: He exhibits no  edema or tenderness.  Lymphadenopathy:    He has no cervical adenopathy.  Neurological:  Patient is awake and alert but seems very dulled in terms of responses. His speech is slow and somewhat hard to understand. Patient's extremities are very stiff to movement. I placed his left arm in a position of elevation which she  maintained for a prolonged period of time. Lower extremities are stiff and difficult to move against resistance. Patient does follow commands to perform grip strength. He responses to questions and is asking his wife questions but his speech seems very slow and difficult to understand. Cognitively the patient seems very delayed.  Skin: Skin is warm and dry. No rash noted.  Psychiatric:  Patient's affect is extremely flat. He is calm and cooperative.     ED Treatments / Results  Labs (all labs ordered are listed, but only abnormal results are displayed) Labs Reviewed  COMPREHENSIVE METABOLIC PANEL - Abnormal; Notable for the following:       Result Value   ALT <5 (*)    All other components within normal limits  URINALYSIS, ROUTINE W REFLEX MICROSCOPIC - Abnormal; Notable for the following:    Ketones, ur 5 (*)    All other components within normal limits  CK - Abnormal; Notable for the following:    Total CK 26 (*)    All other components within normal limits  TROPONIN I  CBC WITH DIFFERENTIAL/PLATELET  PROTIME-INR  C-REACTIVE PROTEIN  I-STAT CG4 LACTIC ACID, ED    EKG  EKG Interpretation  Date/Time:  Sunday December 03 2016 14:47:47 EST Ventricular Rate:  92 PR Interval:    QRS Duration: 94 QT Interval:  365 QTC Calculation: 452 R Axis:   2 Text Interpretation:  Sinus rhythm Low voltage, precordial leads normal. no sig change from previous Confirmed by Johnney Killian, MD, Jeannie Done [phone]) on 12/03/2016 4:26:51 PM       Radiology Dg Chest 2 View  Result Date: 12/03/2016 CLINICAL DATA:  Worsening cough and bilateral extremity weakness. EXAM: CHEST  2 VIEW  COMPARISON:  September 29, 2016 FINDINGS: The heart size and mediastinal contours are within normal limits. The aorta is tortuous. There is no focal infiltrate, pulmonary edema. There are minimal bilateral posterior pleural effusions. There is air beneath the right hemidiaphragm suspicious for free air. The visualized skeletal structures are unremarkable. IMPRESSION: Air beneath the right hemidiaphragm suspicious for free air. These results will be called to the ordering clinician or representative by the Radiologist Assistant, and communication documented in the PACS or zVision Dashboard. Electronically Signed   By: Abelardo Diesel M.D.   On: 12/03/2016 15:01   Ct Renal Stone Study  Result Date: 12/03/2016 CLINICAL DATA:  Worsening bilateral lower extremity weakness over the last 2 days. Urinary frequency. Discolored urine. EXAM: CT ABDOMEN AND PELVIS WITHOUT CONTRAST TECHNIQUE: Multidetector CT imaging of the abdomen and pelvis was performed following the standard protocol without IV contrast. COMPARISON:  08/22/2015 FINDINGS: Lower chest: Some linear atelectasis and/or scarring in the left lower lobe. Hepatobiliary: Normal without contrast. Pancreas: Normal Spleen: Normal Adrenals/Urinary Tract: Adrenal hyperplasia bilaterally. No focal adrenal mass. Kidneys are normal size bilaterally without evidence of obstruction. No evidence of renal stone disease. No mass or cyst. No abnormality along the course of either ureter. Bladder appears normal for age. Stomach/Bowel: Moderate amount of stool within the colon, particularly in the rectal vault. No evidence of small bowel obstruction. No acute inflammatory change. Vascular/Lymphatic: Aortic atherosclerosis. No aneurysm. IVC is normal. No retroperitoneal mass or lymphadenopathy. Reproductive: Negative.  Prostate gland normal size. Other: No free fluid or air. Musculoskeletal: Negative.  Lumbar spine appears normal for age. IMPRESSION: No evidence of urinary tract  pathology on this noncontrast study. Mild linear atelectasis or scarring at the left lung base. Aortic atherosclerosis without aneurysm. Moderate amount of fecal matter throughout  the colon, particularly in the rectal vault. Electronically Signed   By: Nelson Chimes M.D.   On: 12/03/2016 18:01    Procedures Procedures (including critical care time)  Medications Ordered in ED Medications  0.9 %  sodium chloride infusion ( Intravenous New Bag/Given 12/03/16 1502)  ondansetron (ZOFRAN) injection 4 mg (4 mg Intravenous Given 12/03/16 1647)     Initial Impression / Assessment and Plan / ED Course  I have reviewed the triage vital signs and the nursing notes.  Pertinent labs & imaging results that were available during my care of the patient were reviewed by me and considered in my medical decision making (see chart for details).  Clinical Course     Final Clinical Impressions(s) / ED Diagnoses   Final diagnoses:  Neurologic gait dysfunction  Parkinson's disease [facility])   Diagnostic workup does not identify specific etiology for patient's symptoms. Many of his symptoms are consistent with advancing Parkinson's with increasing stiffness and gait dysfunction. Other consideration is medication adverse effect. Patient's wife reports he only takes his Remeron sporadically. Admission was offered. Patient has follow-up within the next 4 days. Patient's wife feels that she would prefer to continue to care for the patient at home. She reports she has plenty of at home assistance. They are instructed on signs and symptoms for return. New Prescriptions New Prescriptions   No medications on file     Charlesetta Shanks, MD 12/03/16 1857

## 2016-12-04 LAB — C-REACTIVE PROTEIN: CRP: <0.8 mg/dL (ref <1.0)

## 2016-12-05 ENCOUNTER — Other Ambulatory Visit: Payer: Self-pay | Admitting: Internal Medicine

## 2016-12-07 ENCOUNTER — Ambulatory Visit: Payer: Medicare Other

## 2016-12-07 ENCOUNTER — Encounter: Payer: Self-pay | Admitting: Internal Medicine

## 2016-12-07 ENCOUNTER — Ambulatory Visit (INDEPENDENT_AMBULATORY_CARE_PROVIDER_SITE_OTHER): Payer: Medicare HMO | Admitting: Internal Medicine

## 2016-12-07 VITALS — BP 138/78 | HR 80 | Temp 98.8°F | Resp 20 | Wt 193.0 lb

## 2016-12-07 DIAGNOSIS — J069 Acute upper respiratory infection, unspecified: Secondary | ICD-10-CM | POA: Diagnosis not present

## 2016-12-07 DIAGNOSIS — R269 Unspecified abnormalities of gait and mobility: Secondary | ICD-10-CM | POA: Diagnosis not present

## 2016-12-07 DIAGNOSIS — G4700 Insomnia, unspecified: Secondary | ICD-10-CM

## 2016-12-07 DIAGNOSIS — G894 Chronic pain syndrome: Secondary | ICD-10-CM

## 2016-12-07 DIAGNOSIS — K5900 Constipation, unspecified: Secondary | ICD-10-CM

## 2016-12-07 DIAGNOSIS — Z8673 Personal history of transient ischemic attack (TIA), and cerebral infarction without residual deficits: Secondary | ICD-10-CM

## 2016-12-07 MED ORDER — CEPHALEXIN 500 MG PO CAPS: 500.0000 mg | ORAL_CAPSULE | Freq: Three times a day (TID) | ORAL | 0 refills | Status: AC

## 2016-12-07 MED ORDER — QUETIAPINE FUMARATE 50 MG PO TABS: 50.0000 mg | ORAL_TABLET | Freq: Every day | ORAL | 3 refills | Status: AC

## 2016-12-07 MED ORDER — TIZANIDINE HCL 2 MG PO TABS: 2.0000 mg | ORAL_TABLET | Freq: Three times a day (TID) | ORAL | 1 refills | Status: DC | PRN

## 2016-12-07 NOTE — Patient Instructions (Addendum)
Ok to stop the compazine and remeron  Please take all new medication as prescribed  - the antibiotic, muscle relaxer, and seroquel  Please also take the miralax 17 gm by mouth daily for constipation  Please continue all other medications as before, and refills have been done if requested.  Please have the pharmacy call with any other refills you may need.  Please keep your appointments with your specialists as you may have planned  You will be contacted regarding the referral for: Home Health, and [facility]  Please return in 3 months, or sooner if needed

## 2016-12-07 NOTE — Progress Notes (Addendum)
Subjective:    Patient ID: [name], male    DOB: 13-Dec-1945, 71 y.o.   MRN: [id number]  HPI  Here to f/u recent ED visit dec 31, Wife states she was verbally told by ED MD to stop the remeron and compazine, but this is not reflected on the AVS.  Wife did stop these meds but sleep now worsening with getting up several times at night, disrupting her sleep.  Has had seroquel exposure years ago, but willing to retry.  Also, incidentally,  Here with 2-3 days acute onset fever, facial pain, pressure, headache, general weakness and malaise, and greenish d/c, with mild ST and cough, but pt denies chest pain, wheezing, increased sob or doe, orthopnea, PND, increased LE swelling, palpitations, dizziness or syncope.  Also for pain - Tramadol not working for pain well, asks for muscle relaxer.   Pt wife stated had enough help at home, but now it appears she is frustrated, asking for helpo, never had [facility] after oct referral due to insurance not covering, now with new insurance.  Also asks for [facility].   Has recurring sharp mild right side abd pain, CT dec 31 with retained fecal matter only.   Pt needs Hospital Bed due to end stage parkinsons, stroke, dementia and diastolic CHF, and requires frequent and immediate changes in body position to lessen pain and suffering, improve mobility, and prevention of aspiration.  Pt needs gel overlay to reduce pressure to the body at the trunk and pelvis and extremities and prevention of decubitus ulcers, due to reduced nutritional status and physical debility related to his comorbid conditions as listed.  .  Past Medical History:  Diagnosis Date  . Bipolar affective disorder ([place])   . BPH (benign prostatic hyperplasia)   . Chronic anxiety   . Chronic constipation   . Dementia   . Depression   . ED (erectile dysfunction) of organic origin   . Elevated PSA   . Generalized weakness    LOWER EXTREMITIES  . History of acute pyelonephritis    admission 07-19-2015  . History  of bladder stone   . 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 History of nonmelanoma skin cancer    EXCISION OF NOSE  . Hyperlipidemia   . Hypertension   . Lower urinary tract symptoms (LUTS)   . Occasional tremors    leg tremors  . Other degenerative diseases of the basal ganglia    S/P CVA 2011--  CHRONIC LEFT BASAL GANGLIA LACUNAR INFARCT  PER CT  . Schizophrenia ([place])    HX PSYCHIATRIC ADMISSION'S  AND ECT ([place]) [place]  . Urinary bladder stone   . Vitamin B12 deficiency    Past Surgical History:  Procedure Laterality Date  . CARDIOVASCULAR STRESS TEST  06-18-2013  DR Jenkins [place]   LOW RISK NO EXERCISE LEXISCAN STUDY/  NO ISCHEMIA, CANNOT RULE OUT BASAL INFERIOR INFARCTION BUT POSSIBLE ARTIFACT GIVEN NORMAL WALL MOTION/  EF 73%  . CYSTOSCOPY WITH LITHOLAPAXY N/A 10/03/2013   Procedure: CYSTOSCOPY WITH LITHOLAPAXY;  Surgeon: Claybon Jabs, MD;  Location: [facility];  Service: Urology;  Laterality: N/A;  . CYSTOSCOPY WITH LITHOLAPAXY N/A 08/13/2015   Procedure: CYSTOSCOPY WITH LITHOLAPAXY;  Surgeon: Kathie Rhodes, MD;  Location: [facility];  Service: Urology;  Laterality: N/A;  . EXCISION TUMOR UPPER LEFT THIGH  2012  . HOLMIUM LASER APPLICATION N/A [id number]   Procedure: HOLMIUM LASER APPLICATION;  Surgeon: Elta Guadeloupe  Nedra Hai, MD;  Location: [facility];  Service: Urology;  Laterality: N/A;  . HOLMIUM LASER APPLICATION N/A [id number]   Procedure: HOLMIUM LASER APPLICATION;  Surgeon: Kathie Rhodes, MD;  Location: [facility];  Service: Urology;  Laterality: N/A;  . TRANSANAL EXCISION ANAL AND RECTAL POLYPS  11-02-2000  . TRANSTHORACIC ECHOCARDIOGRAM  08-16-2014   Mild LVH/ grade I diastolic dysfunction/  ef 60-65%/  mild AV calification without stenosis/  mild RAE  . TRANSURETHRAL INCISION OF PROSTATE N/A 10/03/2013   Procedure: TRANSURETHRAL INCISION OF THE PROSTATE (TUIP);  Surgeon: Claybon Jabs, MD;  Location: [facility];  Service: Urology;  Laterality: N/A;  . TRANSURETHRAL RESECTION OF PROSTATE N/A 08/13/2015   Procedure: TRANSURETHRAL RESECTION OF THE PROSTATE (TURP);  Surgeon: Kathie Rhodes, MD;  Location: [facility];  Service: Urology;  Laterality: N/A;    reports that he has been smoking Cigarettes.  He has a 4.80 pack-year smoking history. He has never used smokeless tobacco. He reports that he does not drink alcohol or use drugs. family history includes Dementia in his father and mother. No Known Allergies Current Outpatient Prescriptions on File Prior to Visit  Medication Sig Dispense Refill  . ALPRAZolam (XANAX) 0.5 MG tablet TAKE 1 TABLET BY MOUTH THREE TIMES DAILY FOR ANXIETY 90 tablet 2  . alum & mag hydroxide-simeth (MAALOX/MYLANTA) 200-200-20 MG/5ML suspension Take 30 mLs by mouth every 6 (six) hours as needed for indigestion or heartburn.    Marland Kitchen amLODipine (NORVASC) 5 MG tablet TAKE 1 TABLET BY MOUTH EVERY DAY 90 tablet 0  . atorvastatin (LIPITOR) 10 MG tablet Take 1 tablet (10 mg total) by mouth daily. 90 tablet 3  . carbidopa-levodopa (SINEMET IR) 25-100 MG tablet TAKE 1 TABLET BY MOUTH FOUR TIMES DAILY 90 tablet 0  . ENSURE (ENSURE) Take 237 mLs by mouth 2 (two) times daily between meals.    . Flaxseed, Linseed, (FLAX SEED OIL) 1000 MG CAPS Take 2,000 mg by mouth daily with breakfast.    . influenza vac recom quadrivalent (FLUBLOK) 0.5 ML injection Inject 0.5 mLs into the muscle once.    . Omega-3 Fatty Acids (FISH OIL) 1200 MG CAPS Take 3 capsules by mouth daily.    . pantoprazole (PROTONIX) 40 MG tablet Take 1 tablet (40 mg total) by mouth daily. 90 tablet 3  . saccharomyces boulardii (FLORASTOR) 250 MG capsule Take 250 mg by mouth daily.    . traMADol (ULTRAM) 50 MG tablet Take 1 tablet (50 mg total) by mouth every 8 (eight) hours as needed. (Patient taking differently: Take 50 mg by mouth every 8 (eight) hours as needed for  moderate pain. ) 90 tablet 2  . vitamin B-12 1000 MCG tablet Take 1 tablet (1,000 mcg total) by mouth daily. 30 tablet 0   No current facility-administered medications on file prior to visit.     Review of Systems  unable due to dementia    Objective:   Physical Exam BP 138/78   Pulse 80   Temp 98.8 F (37.1 C) (Oral)   Resp 20   Wt 193 lb (87.5 kg)   SpO2 98%   BMI 26.18 kg/m  VS noted,  Constitutional: Pt appears in no apparent distress HENT: Head: NCAT.  Right Ear: External ear normal.  Left Ear: External ear normal.  Eyes: . Pupils are equal, round, and reactive to light. left Conjunctivae with erythema, no d/c, and EOM are normal Bilat tm's with mild  erythema.  Max sinus areas non tender.  Pharynx with mild erythema, no exudate Neck: Normal range of motion. Neck supple.  Cardiovascular: Normal rate and regular rhythm.   Pulmonary/Chest: Effort normal and breath sounds without rales or wheezing.  Abd:  Soft, NT, ND, + BS Neurological: Pt is alert. At baseline confused , motor grossly intact Skin: Skin is warm. No rash, no LE edema Psychiatric: Pt behavior is normal. No agitation.  No other new exam findings  Lab Results  Component Value Date   WBC 9.0 12/03/2016   HGB 16.1 12/03/2016   HCT 45.9 12/03/2016   PLT 229 12/03/2016   GLUCOSE 99 12/03/2016   CHOL 185 09/21/2016   TRIG 115.0 09/21/2016   HDL 38.00 (L) 09/21/2016   LDLDIRECT 150.2 12/24/2012   LDLCALC 124 (H) 09/21/2016   ALT <5 (L) 12/03/2016   AST 16 12/03/2016   NA 138 12/03/2016   K 3.7 12/03/2016   CL 104 12/03/2016   CREATININE 0.82 12/03/2016   BUN 16 12/03/2016   CO2 26 12/03/2016   TSH 1.45 04/21/2016   PSA 3.70 04/21/2016   INR 1.09 12/03/2016   HGBA1C 5.2 09/21/2016       Assessment & Plan:

## 2016-12-07 NOTE — Progress Notes (Signed)
Pre visit review using our clinic review tool, if applicable. No additional management support is needed unless otherwise documented below in the visit note. 

## 2016-12-07 NOTE — Assessment & Plan Note (Signed)
Mild to mod, some improved with dulcolox since ED visit, ok to start miralax daily

## 2016-12-09 DIAGNOSIS — G4700 Insomnia, unspecified: Secondary | ICD-10-CM | POA: Insufficient documentation

## 2016-12-09 NOTE — Assessment & Plan Note (Signed)
For [facility] PT

## 2016-12-09 NOTE — Assessment & Plan Note (Signed)
Ok for [facility] and [facility] referral

## 2016-12-09 NOTE — Assessment & Plan Note (Signed)
Mild to mod, for seroquel qhs asd,  to f/u any worsening symptoms or concerns

## 2016-12-09 NOTE — Assessment & Plan Note (Addendum)
Mild to mod, for antibx course,  to f/u any worsening symptoms or concerns  Note:  Total time for pt hx, exam, review of record with pt in the room, determination of diagnoses and plan for further eval and tx is > 40 min, with over 50% spent in coordination and counseling of patient 

## 2016-12-09 NOTE — Assessment & Plan Note (Signed)
Ok for muscle relaxer prn, cont tramadol prn,  to f/u any worsening symptoms or concerns

## 2016-12-13 ENCOUNTER — Other Ambulatory Visit: Payer: Self-pay

## 2016-12-13 NOTE — Patient Outreach (Signed)
[facility]) Care Management  12/13/2016  [name] 1946-02-19 [id number]  REFERRAL DATE: 12/07/16 REFERRAL SOURCE; Primary MD  REFERRAL REASON: Consult for worsening gait disorder, dementia, stroke,  Telephone call to patient regarding primary MD referral. Unable to reach patient. HIPAA compliant voice message left with call back phone number.   PLAN;RNCM will attempt 2nd telephone outreach to patient within 1 week.   Quinn Plowman RN,BSN,CCM [facility] Telephonic  [phone]

## 2016-12-14 ENCOUNTER — Other Ambulatory Visit: Payer: Self-pay

## 2016-12-15 NOTE — Patient Outreach (Signed)
[facility]) Care Management  12/15/2016  [name] Jul 11, 1946 [id number]  Late entry for 12/14/16 REFERRAL DATE: 12/07/16 REFERRAL SOURCE:  Primary MD  REFERRAL REASON: worsening gait disorder, dementia, stroke,  CONSENT: Patient gave verbal authorization to speak with his wife, [name] regarding all health information  SUBJECTIVE:  Telephone call to patient regarding primary MD referral.  Discussed and offered [facility] care management services to wife for patient. Wife states [facility] has started services with patient. Wife states patient is having nursing services, nursing assistant and physical therapy. Wife states she was really needed assistance with bathing for patient. Wife states she is managing all of patients other needs.  Wife refused [facility] care management services at this time. Wife agreed to receive [facility] care management letter and brochure for future use.   PLAN; RNCM will refer patient to care management assistant to close due caregiver refusal of services. RNCM will notify patients primary MD of closure.; RNCM will send patient [facility] care management outreach letter and brochure.   Quinn Plowman RN,BSN,CCM [facility] Telephonic  [phone]

## 2016-12-18 ENCOUNTER — Telehealth: Payer: Self-pay | Admitting: Internal Medicine

## 2016-12-18 NOTE — Telephone Encounter (Signed)
Spouse states that nurse with [facility] advised her to call to let Dr. Jenny Reichmann know that patient is in the last stages of parkinson's.  States that patient is seeing people who are not there and he is not eating.  Also, states that nurse told her to request a script for a hospital bed because patient is currently sleeping on the couch.

## 2016-12-19 ENCOUNTER — Other Ambulatory Visit: Payer: Self-pay | Admitting: Internal Medicine

## 2016-12-19 NOTE — Telephone Encounter (Signed)
Done hardcopy to Corinne  

## 2016-12-19 NOTE — Telephone Encounter (Signed)
Patients wife is picking up

## 2016-12-22 ENCOUNTER — Telehealth: Payer: Self-pay | Admitting: Internal Medicine

## 2016-12-22 ENCOUNTER — Other Ambulatory Visit: Payer: Self-pay | Admitting: Internal Medicine

## 2016-12-22 DIAGNOSIS — G259 Extrapyramidal and movement disorder, unspecified: Secondary | ICD-10-CM

## 2016-12-22 NOTE — Telephone Encounter (Signed)
Shaunda, PT from [place], called request referral for [name] to go see Dr. Tate(neurology) for possible parkinson. Please advise.

## 2016-12-22 NOTE — Telephone Encounter (Signed)
faxed

## 2016-12-22 NOTE — Telephone Encounter (Signed)
Done hardcopy to Corinne  

## 2016-12-22 NOTE — Telephone Encounter (Signed)
Routing to dr john, please advise, thanks 

## 2016-12-22 NOTE — Telephone Encounter (Signed)
[place] for corinne to call wife  Does pt see neurology?  If not, we can refer to Dr Tat who works with [facility]

## 2016-12-25 ENCOUNTER — Telehealth: Payer: Self-pay | Admitting: Emergency Medicine

## 2016-12-25 DIAGNOSIS — N400 Enlarged prostate without lower urinary tract symptoms: Secondary | ICD-10-CM

## 2016-12-25 NOTE — Telephone Encounter (Signed)
Verbals given  

## 2016-12-25 NOTE — Telephone Encounter (Signed)
[facility] called and patients blood pressure is 98/70. He hasn't been drinking much. He has been choking up when drinking water so she wants to know if she can get a speech therapy consult. Please adviser thanks.

## 2016-12-25 NOTE — Telephone Encounter (Signed)
Pts wife called and patient has an appt across the street with Urology Dr Shawnie Dapper. He is wondering if he can get a referral but in so that he can be seen. Please advise thanks.

## 2016-12-26 ENCOUNTER — Telehealth: Payer: Self-pay | Admitting: Internal Medicine

## 2016-12-26 NOTE — Addendum Note (Signed)
Addended by: Biagio Borg on: 12/26/2016 04:32 PM   Modules accepted: Orders

## 2016-12-26 NOTE — Telephone Encounter (Signed)
Referral done

## 2016-12-26 NOTE — Telephone Encounter (Signed)
Called to check up on neurology referral.  Got with Cecille Rubin.  Patient can call neurology back.

## 2016-12-26 NOTE — Telephone Encounter (Signed)
Ok refer Dr Carles Collet

## 2016-12-27 NOTE — Telephone Encounter (Signed)
Waiting on MD signature 

## 2016-12-27 NOTE — Telephone Encounter (Signed)
Pt wife called in and said [facility] has faxed over some kind of paperwork that they need back.  She could not tell me what the paperwork was .  Have you seen any?

## 2016-12-28 ENCOUNTER — Telehealth: Payer: Self-pay | Admitting: Internal Medicine

## 2016-12-28 DIAGNOSIS — G894 Chronic pain syndrome: Secondary | ICD-10-CM | POA: Diagnosis not present

## 2016-12-28 DIAGNOSIS — F209 Schizophrenia, unspecified: Secondary | ICD-10-CM | POA: Diagnosis not present

## 2016-12-28 DIAGNOSIS — R2689 Other abnormalities of gait and mobility: Secondary | ICD-10-CM | POA: Diagnosis not present

## 2016-12-28 DIAGNOSIS — J069 Acute upper respiratory infection, unspecified: Secondary | ICD-10-CM | POA: Diagnosis not present

## 2016-12-28 NOTE — Telephone Encounter (Signed)
Donita, nurse from [facility], request verbal order for 2 PRN nursing for Mr. Dickert due to his head condition. Please call her back

## 2016-12-28 NOTE — Telephone Encounter (Signed)
Ok for verbals 

## 2016-12-29 NOTE — Telephone Encounter (Signed)
Verbals given  

## 2017-01-01 ENCOUNTER — Emergency Department (HOSPITAL_COMMUNITY)
Admission: EM | Admit: 2017-01-01 | Discharge: 2017-01-02 | Disposition: A | Discharge status: Home / Self Care | Payer: Medicare HMO | Attending: Emergency Medicine | Admitting: Emergency Medicine

## 2017-01-01 ENCOUNTER — Emergency Department (HOSPITAL_COMMUNITY): Payer: Medicare HMO

## 2017-01-01 ENCOUNTER — Encounter (HOSPITAL_COMMUNITY): Payer: Self-pay | Admitting: Emergency Medicine

## 2017-01-01 DIAGNOSIS — G20 Parkinson's disease: Secondary | ICD-10-CM | POA: Diagnosis not present

## 2017-01-01 DIAGNOSIS — R1930 Abdominal rigidity, unspecified site: Secondary | ICD-10-CM | POA: Diagnosis not present

## 2017-01-01 DIAGNOSIS — Z8673 Personal history of transient ischemic attack (TIA), and cerebral infarction without residual deficits: Secondary | ICD-10-CM | POA: Diagnosis not present

## 2017-01-01 DIAGNOSIS — Z85828 Personal history of other malignant neoplasm of skin: Secondary | ICD-10-CM | POA: Insufficient documentation

## 2017-01-01 DIAGNOSIS — I5032 Chronic diastolic (congestive) heart failure: Secondary | ICD-10-CM | POA: Insufficient documentation

## 2017-01-01 DIAGNOSIS — I110 Hypertensive heart disease with heart failure: Secondary | ICD-10-CM | POA: Insufficient documentation

## 2017-01-01 DIAGNOSIS — F17210 Nicotine dependence, cigarettes, uncomplicated: Secondary | ICD-10-CM | POA: Diagnosis not present

## 2017-01-01 DIAGNOSIS — R29898 Other symptoms and signs involving the musculoskeletal system: Secondary | ICD-10-CM

## 2017-01-01 DIAGNOSIS — R109 Unspecified abdominal pain: Secondary | ICD-10-CM | POA: Diagnosis present

## 2017-01-01 LAB — COMPREHENSIVE METABOLIC PANEL
ALK PHOS: 98 U/L (ref 38–126)
ALT: 14 U/L — AB (ref 17–63)
ANION GAP: 9 (ref 5–15)
AST: 17 U/L (ref 15–41)
Albumin: 4 g/dL (ref 3.5–5.0)
BUN: 22 mg/dL — ABNORMAL HIGH (ref 6–20)
CALCIUM: 8.8 mg/dL — AB (ref 8.9–10.3)
CO2: 24 mmol/L (ref 22–32)
CREATININE: 0.88 mg/dL (ref 0.61–1.24)
Chloride: 106 mmol/L (ref 101–111)
Glucose, Bld: 106 mg/dL — ABNORMAL HIGH (ref 65–99)
Potassium: 3.8 mmol/L (ref 3.5–5.1)
SODIUM: 139 mmol/L (ref 135–145)
Total Bilirubin: 0.8 mg/dL (ref 0.3–1.2)
Total Protein: 6.7 g/dL (ref 6.5–8.1)

## 2017-01-01 LAB — CBC WITH DIFFERENTIAL/PLATELET
Basophils Absolute: 0 10*3/uL (ref 0.0–0.1)
Basophils Relative: 0 %
EOS ABS: 0.1 10*3/uL (ref 0.0–0.7)
EOS PCT: 1 %
HCT: 43.3 % (ref 39.0–52.0)
Hemoglobin: 15.3 g/dL (ref 13.0–17.0)
LYMPHS PCT: 14 %
Lymphs Abs: 1.3 10*3/uL (ref 0.7–4.0)
MCH: 30.7 pg (ref 26.0–34.0)
MCHC: 35.3 g/dL (ref 30.0–36.0)
MCV: 86.9 fL (ref 78.0–100.0)
MONOS PCT: 7 %
Monocytes Absolute: 0.7 10*3/uL (ref 0.1–1.0)
Neutro Abs: 7.1 10*3/uL (ref 1.7–7.7)
Neutrophils Relative %: 78 %
PLATELETS: 220 10*3/uL (ref 150–400)
RBC: 4.98 MIL/uL (ref 4.22–5.81)
RDW: 12.6 % (ref 11.5–15.5)
WBC: 9.2 10*3/uL (ref 4.0–10.5)

## 2017-01-01 LAB — LIPASE, BLOOD: LIPASE: 18 U/L (ref 11–51)

## 2017-01-01 NOTE — Progress Notes (Signed)
Entered in d/c instructions [place]   [phone] [place] [address]    Next Steps: Call on 01/02/2017    Instructions: You continue to have home health services for Nurse, speech therapy and social worker They are working on providing an aide  If you need more services your primary provider can assist

## 2017-01-01 NOTE — ED Notes (Signed)
No response when called from lobby. 

## 2017-01-01 NOTE — ED Notes (Signed)
ED Provider at bedside. 

## 2017-01-01 NOTE — Progress Notes (Signed)
CM spoke with pt and wife to assess needs  Pt with parkinson disease having wife to massage his feet Wife states they are cold and he says heavy Pt with mumbling speech More collateral from wife Cm reviewed Cm consult but wife denies need for home health and states palliative dr visit recently and she knows if more services needed she can get them Reports pt with RN, SW, Speech therapy and they have told her the are trying to work on an aide and hospital bed as pt is staying on a couch at this time Has DME to include a RW, w/c, and bedside commode.  Denies other needs except already ordered and pending bed CM updated EDP Cardama that wife specifically informed Cm that the [facility] and Palliative MD request she bring pt "to ED to see if he needed admission" "I told him I had everything" Still pending labs - Lab tech in room while CM there to get labs. Wife provided with list of private duty nursing list and choice connections pamphlet

## 2017-01-01 NOTE — ED Triage Notes (Signed)
Per EMS, patient from home, c/o upper abdominal pain since this morning. Home health nurse reports patient has dark urine and "may be impacted." Hx Parkinson's and dementia. Denies N/V.

## 2017-01-01 NOTE — ED Notes (Signed)
Bed: WA20 Expected date:  Expected time:  Means of arrival:  Comments: Hall D 

## 2017-01-01 NOTE — Progress Notes (Addendum)
   01/01/17 0000  CM Assessment  Expected Discharge [place]  In-house Referral NA  Discharge Planning Services CM Consult  [facility]  Choice offered to / list presented to  Patient;Spouse  DME Arranged N/A  DME Agency [place] RN;PT;Nurse's [place]  Status of Service Completed, signed off  Discharge [place]   ED CM consulted by EDP, Cardama who states pt's wife interested in more home services but already has Home health via [facility] and is to have a palliative care meeting for goals of care soon. Discussed options with EDP to how additional services may be offered in home EDP has evaluated pt but has pending labs to assist to determine if pt is medically clear or not Consults in EPIC for CM and ED SW  ED SW Caryl Pina called this CM and CM is able to see SW consulted too.  CM updated SW on conversation with EDP, Cardama and pending labs to check if pt is medically clear.

## 2017-01-01 NOTE — Progress Notes (Signed)
CSW spoke with attending MD. Per MD, Patient presents after being evaluated by Palliative this morning and told to come to the ED for admission due to decompensation. RN Case Manager assisting Patient with home health services. MD requesting SW be added through home health agency. CSW continues to follow for disposition.    Lorrine Kin, MSW, LCSW [facility] ED/75M Clinical Social Worker [phone]

## 2017-01-01 NOTE — ED Provider Notes (Signed)
[facility] DEPT Provider Note   CSN: [id number] Arrival date & time: 01/01/17  1335     History   Chief Complaint Chief Complaint  Patient presents with  . Abdominal Pain    HPI [name] is a 71 y.o. male.  The history is provided by the spouse.  Abdominal Cramping  This is a recurrent problem. Episode onset: several months. Episode frequency: intermittent. The problem has not changed since onset.Pertinent negatives include no chest pain, no headaches and no shortness of breath. Nothing aggravates the symptoms. Nothing relieves the symptoms. Treatments tried: dulcolax. The treatment provided mild relief.   Wife reports h/o constipation.   The main reason they presented was because the Palliative care team performed their first evaluation at home and recommended they present to the ED for evaluation to see if patient required admission for any reason. Wife reports that the patient's Parkinson's disorder has been progressing recently to the point where the home health team has difficulty assisting the patient with ambulation. The patient also has had difficulty with swallowing which the speech therapist has recommended thick nectar liquids. She also reports several falls 2-3 weeks ago. Denies any infectious symptoms.   Past Medical History:  Diagnosis Date  . Bipolar affective disorder ([place])   . BPH (benign prostatic hyperplasia)   . Chronic anxiety   . Chronic constipation   . Dementia   . Depression   . ED (erectile dysfunction) of organic origin   . Elevated PSA   . Generalized weakness    LOWER EXTREMITIES  . History of acute pyelonephritis    admission 07-19-2015  . History of bladder stone   . 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 History of nonmelanoma skin cancer    EXCISION OF NOSE  . Hyperlipidemia   . Hypertension   . Lower urinary tract symptoms (LUTS)   . Occasional tremors    leg tremors  . Other  degenerative diseases of the basal ganglia    S/P CVA 2011--  CHRONIC LEFT BASAL GANGLIA LACUNAR INFARCT  PER CT  . Schizophrenia ([place])    HX PSYCHIATRIC ADMISSION'S  AND ECT ([place]) [place]  . Urinary bladder stone   . Vitamin B12 deficiency     Patient Active Problem List   Diagnosis Date Noted  . Insomnia 12/09/2016  . Constipation 12/07/2016  . Hyperglycemia 09/23/2016  . Dark urine 09/21/2016  . Thyroid nodule 09/21/2016  . Gait instability   . TIA (transient ischemic attack) 12/02/2015  . BPH with urinary obstruction 08/13/2015  . Acute pyelonephritis-culture negative s/p multiple rounds Abx 07/21/2015  . Cystitis 07/20/2015  . Obstructed, uropathy 07/20/2015  . History of CVA (cerebrovascular accident) 07/20/2015  . NSVT (nonsustained ventricular tachycardia) ([place]) 07/20/2015  . Chronic pain syndrome 06/10/2015  . Nausea without vomiting 06/09/2015  . Abnormal breath sounds 12/15/2014  . Parkinson's disease ([place]) 11/25/2014  . Vitamin B 12 deficiency 11/25/2014  . Ataxia 11/23/2014  . Chronic diastolic CHF (congestive heart failure) ([place]) 08/17/2014  . Hypokalemia 08/16/2014  . Weakness generalized 08/15/2014  . Urinary bladder stone 08/12/2014  . Gait disorder 08/12/2014  . Acute upper respiratory infection 12/02/2013  . Chronic nausea 12/02/2013  . Urinary incontinence 12/02/2013  . Calculus of bladder 10/03/2013  . Peripheral edema 06/10/2013  . Dyspepsia 12/24/2012  . Bladder neck obstruction 12/24/2012  . Glucose intolerance (impaired glucose tolerance) 06/27/2012  . Bipolar affective disorder ([place]) 06/27/2012  . Tremor 06/27/2012  .  Skin lesion 06/27/2012  . Erectile dysfunction 06/27/2012  . OAB (overactive bladder) 06/27/2012  . Encounter for preventative adult health care exam with abnormal findings 06/22/2012  . Depression 06/22/2012  . GERD (gastroesophageal reflux disease) 06/22/2012  . Fatty liver 06/22/2012  . Chronic headaches 06/22/2012  . Stroke  ([place])   . Anxiety   . Skin cancer of nose   . Rectal polyp   . BPH (benign prostatic hyperplasia) 02/01/2012  . Dementia   . HTN (hypertension)   . Vitamin B12 deficiency   . Schizophrenia ([place])   . Hyperlipidemia     Past Surgical History:  Procedure Laterality Date  . CARDIOVASCULAR STRESS TEST  06-18-2013  DR Jenkins [place]   LOW RISK NO EXERCISE LEXISCAN STUDY/  NO ISCHEMIA, CANNOT RULE OUT BASAL INFERIOR INFARCTION BUT POSSIBLE ARTIFACT GIVEN NORMAL WALL MOTION/  EF 73%  . CYSTOSCOPY WITH LITHOLAPAXY N/A 10/03/2013   Procedure: CYSTOSCOPY WITH LITHOLAPAXY;  Surgeon: Claybon Jabs, MD;  Location: [facility];  Service: Urology;  Laterality: N/A;  . CYSTOSCOPY WITH LITHOLAPAXY N/A 08/13/2015   Procedure: CYSTOSCOPY WITH LITHOLAPAXY;  Surgeon: Kathie Rhodes, MD;  Location: [facility];  Service: Urology;  Laterality: N/A;  . EXCISION TUMOR UPPER LEFT THIGH  2012  . HOLMIUM LASER APPLICATION N/A [id number]   Procedure: HOLMIUM LASER APPLICATION;  Surgeon: Claybon Jabs, MD;  Location: [facility];  Service: Urology;  Laterality: N/A;  . HOLMIUM LASER APPLICATION N/A [id number]   Procedure: HOLMIUM LASER APPLICATION;  Surgeon: Kathie Rhodes, MD;  Location: [facility];  Service: Urology;  Laterality: N/A;  . TRANSANAL EXCISION ANAL AND RECTAL POLYPS  11-02-2000  . TRANSTHORACIC ECHOCARDIOGRAM  08-16-2014   Mild LVH/ grade I diastolic dysfunction/  ef 60-65%/  mild AV calification without stenosis/  mild RAE  . TRANSURETHRAL INCISION OF PROSTATE N/A 10/03/2013   Procedure: TRANSURETHRAL INCISION OF THE PROSTATE (TUIP);  Surgeon: Claybon Jabs, MD;  [facility];  Service: Urology;  Laterality: N/A;  . TRANSURETHRAL RESECTION OF PROSTATE N/A 08/13/2015   Procedure: TRANSURETHRAL RESECTION OF THE PROSTATE (TURP);  Surgeon: Kathie Rhodes, MD;  Location: [facility];  Service: Urology;  Laterality:  N/A;       Home Medications    Prior to Admission medications   Medication Sig Start Date End Date Taking? Authorizing Provider  ALPRAZolam Duanne Moron) 0.5 MG tablet TAKE 1 TABLET BY MOUTH THREE TIMES DAILY FOR ANXIETY 12/19/16  Yes Biagio Borg, MD  alum & mag hydroxide-simeth (MAALOX/MYLANTA) 200-200-20 MG/5ML suspension Take 30 mLs by mouth every 6 (six) hours as needed for indigestion or heartburn.   Yes Historical Provider, MD  amLODipine (NORVASC) 5 MG tablet TAKE 1 TABLET BY MOUTH EVERY DAY Patient taking differently: take 5 mg po daily 11/15/16  Yes Biagio Borg, MD  Flaxseed, Linseed, (FLAX SEED OIL) 1000 MG CAPS Take 2,000 mg by mouth daily with breakfast.   Yes Historical Provider, MD  mirtazapine (REMERON) 15 MG tablet Take 15 mg by mouth at bedtime as needed. 12/22/16  Yes Historical Provider, MD  Nutritional Supplements (CARNATION INST BREAKFAST JUICE PO) Take 1 Bottle by mouth 2 (two) times daily.   Yes Historical Provider, MD  Omega-3 Fatty Acids (FISH OIL) 1200 MG CAPS Take 3 capsules by mouth daily.   Yes Historical Provider, MD  pantoprazole (PROTONIX) 40 MG tablet Take 1 tablet (40 mg total) by mouth daily. 06/09/15  Yes Jeneen Rinks  Quin Hoop, MD  prochlorperazine (COMPAZINE) 5 MG tablet Take 5 mg by mouth every 8 (eight) hours as needed. 12/03/16  Yes Historical Provider, MD  QUEtiapine (SEROQUEL) 50 MG tablet Take 1 tablet (50 mg total) by mouth at bedtime. Patient taking differently: Take 50 mg by mouth 2 (two) times daily as needed.  12/07/16  Yes Biagio Borg, MD  saccharomyces boulardii (FLORASTOR) 250 MG capsule Take 250 mg by mouth daily.   Yes Historical Provider, MD  tiZANidine (ZANAFLEX) 2 MG tablet Take 1 tablet (2 mg total) by mouth every 8 (eight) hours as needed for muscle spasms. 12/07/16  Yes Biagio Borg, MD  traMADol (ULTRAM) 50 MG tablet TAKE 1 TABLET BY MOUTH EVERY 8 HOURS AS NEEDED 12/22/16  Yes Biagio Borg, MD  atorvastatin (LIPITOR) 10 MG tablet Take 1 tablet (10 mg  total) by mouth daily. Patient not taking: Reported on 01/01/2017 04/21/16   Biagio Borg, MD  carbidopa-levodopa (SINEMET IR) 25-100 MG tablet TAKE 1 TABLET BY MOUTH FOUR TIMES DAILY Patient not taking: Reported on 01/01/2017 12/05/16   Biagio Borg, MD  influenza vac recom quadrivalent (FLUBLOK) 0.5 ML injection Inject 0.5 mLs into the muscle once.    Historical Provider, MD  vitamin B-12 1000 MCG tablet Take 1 tablet (1,000 mcg total) by mouth daily. Patient not taking: Reported on 01/01/2017 08/17/14   Janece Canterbury, MD    Family History Family History  Problem Relation Age of Onset  . Dementia Father   . Dementia Mother   . Colon cancer Neg Hx   . Colon polyps Neg Hx   . Rectal cancer Neg Hx   . Stomach cancer Neg Hx     Social History Social History  Substance Use Topics  . Smoking status: Current Every Day Smoker    Packs/day: 0.10    Years: 48.00    Types: Cigarettes  . Smokeless tobacco: Never Used  . Alcohol use No     Allergies   Patient has no known allergies.   Review of Systems Review of Systems  Respiratory: Negative for shortness of breath.   Cardiovascular: Negative for chest pain.  Neurological: Negative for headaches.     Physical Exam Updated Vital Signs BP 148/100   Pulse 95   Temp 97.6 F (36.4 C) (Rectal)   Resp 16   SpO2 93%   Physical Exam  Constitutional: He is oriented to person, place, and time. He appears well-developed and well-nourished. No distress.  HENT:  Head: Normocephalic and atraumatic.  Nose: Nose normal.  Eyes: Conjunctivae and EOM are normal. Pupils are equal, round, and reactive to light. Right eye exhibits no discharge. Left eye exhibits no discharge. No scleral icterus.  Neck: Normal range of motion. Neck supple.  Cardiovascular: Normal rate and regular rhythm.  Exam reveals no gallop and no friction rub.   No murmur heard. Pulmonary/Chest: Effort normal and breath sounds normal. No stridor. No respiratory distress.  He has no rales.  Abdominal: Soft. He exhibits no distension. There is no tenderness.  Musculoskeletal: He exhibits no edema or tenderness.  Neurological: He is alert and oriented to person, place, and time. He displays no tremor. He exhibits abnormal muscle tone (lead pipe rigidity). GCS eye subscore is 4. GCS verbal subscore is 5. GCS motor subscore is 6.  Skin: Skin is warm and dry. No rash noted. He is not diaphoretic. No erythema.  Psychiatric: He has a normal mood and affect.  Vitals reviewed.  ED Treatments / Results  Labs (all labs ordered are listed, but only abnormal results are displayed) Labs Reviewed  COMPREHENSIVE METABOLIC PANEL - Abnormal; Notable for the following:       Result Value   Glucose, Bld 106 (*)    BUN 22 (*)    Calcium 8.8 (*)    ALT 14 (*)    All other components within normal limits  CBC WITH DIFFERENTIAL/PLATELET  LIPASE, BLOOD    EKG  EKG Interpretation None       Radiology Ct Head Wo Contrast  Result Date: 01/01/2017 CLINICAL DATA:  Mental status changes EXAM: CT HEAD WITHOUT CONTRAST TECHNIQUE: Contiguous axial images were obtained from the base of the skull through the vertex without intravenous contrast. COMPARISON:  09/13/2016 FINDINGS: Brain: Global atrophy. Chronic ischemic changes in the periventricular white matter. No mass effect, midline shift, or acute hemorrhage. Vascular: No hyperdense vessel or unexpected calcification. Skull: Cranium is intact. Sinuses/Orbits: Mastoid air cells are clear. Visualized paranasal sinuses are clear. Other: Noncontributory IMPRESSION: No acute intracranial pathology.  Chronic changes are noted. Electronically Signed   By: Marybelle Killings M.D.   On: 01/01/2017 17:07    Procedures Procedures (including critical care time)  Medications Ordered in ED Medications - No data to display   Initial Impression / Assessment and Plan / ED Course  I have reviewed the triage vital signs and the nursing  notes.  Pertinent labs & imaging results that were available during my care of the patient were reviewed by me and considered in my medical decision making (see chart for details).     Abdominal exam benign. Screening labs grossly reassuring. No indication for advanced abdominal imaging at this time. CT head obtained given recent falls and recent status decline; this was negative. No concern for infection at this time.  Discussed case with care management who reports that the patient has full in home care and we are unable to provide additional assistance.   The patient is safe for discharge with strict return precautions.   Final Clinical Impressions(s) / ED Diagnoses   Final diagnoses:  Rigidity  Abdominal discomfort   Disposition: Discharge  Condition: Good  I have discussed the results, Dx and Tx plan with the patient and wife who expressed understanding and agree(s) with the plan. Discharge instructions discussed at great length. The patient and wife was given strict return precautions who verbalized understanding of the instructions. No further questions at time of discharge.    Discharge Medication List as of 01/01/2017  6:18 PM      Follow Up: [place] [address] [place] [address] [phone]  Call on 01/02/2017 You continue to have home health services for Nurse, speech therapy and social worker They are working on providing an aide  If you need more services your primary provider can [place], [place] [address] [phone]  Schedule an appointment as soon as possible for a visit  As needed      Fatima Blank, MD 01/02/17 1609

## 2017-01-01 NOTE — ED Notes (Signed)
[facility] called for transport.  

## 2017-01-01 NOTE — ED Notes (Signed)
Bed: WHALD Expected date:  Expected time:  Means of arrival:  Comments: 

## 2017-01-01 NOTE — Progress Notes (Addendum)
ED Cm updated [name] of [facility] that the pt was in [facility] ED being evaluated since the [facility] had request he come for a visit Pending labs [facility] to follow for d/c needs EPIC indicates that pt/wife had been offered [facility] services on 12/14/16 but denied the need for [facility] services and referral closed See telephone notes WIfe confirmed she had a call from [facility] staff

## 2017-01-02 ENCOUNTER — Telehealth: Payer: Self-pay | Admitting: Internal Medicine

## 2017-01-02 NOTE — Telephone Encounter (Signed)
Pt's wife called stating [facility] has not yet received the paperwork they are needing for pt to get his hospital bed. Can you please give them a call so they can get the bed.

## 2017-01-02 NOTE — Telephone Encounter (Signed)
[facility]ed [facility]. Spoke to a referral specialist. They are supposed to send back another statement of medical necessity for [place] to sign and return so that the patient can receive these items.

## 2017-01-02 NOTE — Telephone Encounter (Signed)
Already spoke to [facility]. Already faxed over.

## 2017-01-02 NOTE — Telephone Encounter (Signed)
States two weeks ago patient could walk and talk but now he can hardly talk or bear weight.  States patient has progressively gotten worse.  Has gone to ED.  States ED draws blood and sends him home.  States no one is doing anything for this patient.  States that a hospital bed has supposedly been ordered but has had to send forms over twice.  Please make sure this gets sent back.   Please follow up in regard.    Will make appt for patient.

## 2017-01-04 ENCOUNTER — Ambulatory Visit: Payer: Medicare HMO | Admitting: Internal Medicine

## 2017-01-10 ENCOUNTER — Telehealth: Payer: Self-pay | Admitting: Internal Medicine

## 2017-01-10 NOTE — Telephone Encounter (Signed)
[name] from palliative care called and stated the wife wants the husband [name]) moved from palliative to hospice care. They want a ok for that. They also want to know if Dr. Jenny Reichmann will coming looking over his care.  [phone] [name].  I informed he is out of office until Feb 20th.

## 2017-01-10 NOTE — Telephone Encounter (Signed)
Ok to move to hospice.  I believe Dr Jenny Reichmann will be looking over his care - let them know [place].

## 2017-01-11 ENCOUNTER — Emergency Department (HOSPITAL_COMMUNITY)
Admission: EM | Admit: 2017-01-11 | Discharge: 2017-01-11 | Disposition: A | Discharge status: Home / Self Care | Payer: Medicare HMO | Attending: Emergency Medicine | Admitting: Emergency Medicine

## 2017-01-11 ENCOUNTER — Emergency Department (HOSPITAL_COMMUNITY): Payer: Medicare HMO

## 2017-01-11 ENCOUNTER — Encounter (HOSPITAL_COMMUNITY): Payer: Self-pay | Admitting: *Deleted

## 2017-01-11 DIAGNOSIS — G20 Parkinson's disease: Secondary | ICD-10-CM | POA: Insufficient documentation

## 2017-01-11 DIAGNOSIS — I110 Hypertensive heart disease with heart failure: Secondary | ICD-10-CM | POA: Diagnosis not present

## 2017-01-11 DIAGNOSIS — Z8673 Personal history of transient ischemic attack (TIA), and cerebral infarction without residual deficits: Secondary | ICD-10-CM | POA: Diagnosis not present

## 2017-01-11 DIAGNOSIS — F0390 Unspecified dementia without behavioral disturbance: Secondary | ICD-10-CM | POA: Insufficient documentation

## 2017-01-11 DIAGNOSIS — Z79899 Other long term (current) drug therapy: Secondary | ICD-10-CM | POA: Diagnosis not present

## 2017-01-11 DIAGNOSIS — F17210 Nicotine dependence, cigarettes, uncomplicated: Secondary | ICD-10-CM | POA: Diagnosis not present

## 2017-01-11 DIAGNOSIS — I5032 Chronic diastolic (congestive) heart failure: Secondary | ICD-10-CM | POA: Diagnosis not present

## 2017-01-11 LAB — URINALYSIS, ROUTINE W REFLEX MICROSCOPIC
Bilirubin Urine: NEGATIVE
Glucose, UA: NEGATIVE mg/dL
Hgb urine dipstick: NEGATIVE
Ketones, ur: NEGATIVE mg/dL
Leukocytes, UA: NEGATIVE
Nitrite: NEGATIVE
Protein, ur: NEGATIVE mg/dL
Specific Gravity, Urine: 1.016 (ref 1.005–1.030)
pH: 6 (ref 5.0–8.0)

## 2017-01-11 LAB — CBC WITH DIFFERENTIAL/PLATELET
Basophils Absolute: 0 K/uL (ref 0.0–0.1)
Basophils Relative: 0 %
Eosinophils Absolute: 0.1 K/uL (ref 0.0–0.7)
Eosinophils Relative: 1 %
HCT: 42.4 % (ref 39.0–52.0)
Hemoglobin: 15.1 g/dL (ref 13.0–17.0)
Lymphocytes Relative: 7 %
Lymphs Abs: 0.7 K/uL (ref 0.7–4.0)
MCH: 31.4 pg (ref 26.0–34.0)
MCHC: 35.6 g/dL (ref 30.0–36.0)
MCV: 88.1 fL (ref 78.0–100.0)
Monocytes Absolute: 0.7 K/uL (ref 0.1–1.0)
Monocytes Relative: 6 %
Neutro Abs: 9.4 K/uL — ABNORMAL HIGH (ref 1.7–7.7)
Neutrophils Relative %: 86 %
Platelets: 215 K/uL (ref 150–400)
RBC: 4.81 MIL/uL (ref 4.22–5.81)
RDW: 12.7 % (ref 11.5–15.5)
WBC: 10.8 K/uL — ABNORMAL HIGH (ref 4.0–10.5)

## 2017-01-11 LAB — COMPREHENSIVE METABOLIC PANEL
ALT: 12 U/L — ABNORMAL LOW (ref 17–63)
AST: 21 U/L (ref 15–41)
Albumin: 3.9 g/dL (ref 3.5–5.0)
Alkaline Phosphatase: 91 U/L (ref 38–126)
Anion gap: 7 (ref 5–15)
BUN: 19 mg/dL (ref 6–20)
CO2: 26 mmol/L (ref 22–32)
Calcium: 8.8 mg/dL — ABNORMAL LOW (ref 8.9–10.3)
Chloride: 107 mmol/L (ref 101–111)
Creatinine, Ser: 0.89 mg/dL (ref 0.61–1.24)
GFR calc Af Amer: >60 mL/min (ref >60)
GFR calc non Af Amer: >60 mL/min (ref >60)
Glucose, Bld: 124 mg/dL — ABNORMAL HIGH (ref 65–99)
Potassium: 4.1 mmol/L (ref 3.5–5.1)
Sodium: 140 mmol/L (ref 135–145)
Total Bilirubin: 0.7 mg/dL (ref 0.3–1.2)
Total Protein: 6.8 g/dL (ref 6.5–8.1)

## 2017-01-11 LAB — TROPONIN I: Troponin I: <0.03 ng/mL

## 2017-01-11 LAB — I-STAT CG4 LACTIC ACID, ED: Lactic Acid, Venous: 1.51 mmol/L (ref 0.5–1.9)

## 2017-01-11 MED ORDER — SODIUM CHLORIDE 0.9 % IJ SOLN
INTRAMUSCULAR | Status: AC
  Filled 2017-01-11: qty 50

## 2017-01-11 MED ORDER — IOPAMIDOL (ISOVUE-300) INJECTION 61%
INTRAVENOUS | Status: AC
  Filled 2017-01-11: qty 100

## 2017-01-11 NOTE — ED Notes (Signed)
Patient transported to CT 

## 2017-01-11 NOTE — Telephone Encounter (Signed)
Left message on number provided stating that this was ok for him to be moved

## 2017-01-11 NOTE — Discharge Instructions (Signed)
Please follow-up with primary care provider and neurologist at your scheduled appointment. Please follow-up with orthopedics for further evaluation of right shoulder pain, x-ray showed arthritis today. Please return to the emergency department if you develop any new or worsening symptoms.

## 2017-01-11 NOTE — ED Triage Notes (Signed)
Per EMS report: pt coming from home and presents with increased confusion.  Family reports increased frequency in urination that is dark and pungent in odor. Pt was a/o x 4 with EMS. Pt is normally bed bound. Pt denies any other symptoms or pain to EMS. Hx dementia.

## 2017-01-11 NOTE — ED Provider Notes (Signed)
[facility] DEPT Provider Note   CSN: [id number] Arrival date & time: 01/11/17  1500     History   Chief Complaint Chief Complaint  Patient presents with  . Urinary Tract Infection    HPI Level V caveat secondary to dementia [name] is a 71 y.o. male with history of Parkinson's disease, dementia, BPH who presents with urinary frequency that has been worsening over the past few days. Family also reports increased confusion and foul odor to the urine. Patient states he had some suprapubic pressure and some low back pain. He denies any other pain including chest pain, shortness of breath, abdominal pain, nausea, vomiting.  HPI  Past Medical History:  Diagnosis Date  . Bipolar affective disorder ([place])   . BPH (benign prostatic hyperplasia)   . Chronic anxiety   . Chronic constipation   . Dementia   . Depression   . ED (erectile dysfunction) of organic origin   . Elevated PSA   . Generalized weakness    LOWER EXTREMITIES  . History of acute pyelonephritis    admission 07-19-2015  . History of bladder stone   . 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 History of nonmelanoma skin cancer    EXCISION OF NOSE  . Hyperlipidemia   . Hypertension   . Lower urinary tract symptoms (LUTS)   . Occasional tremors    leg tremors  . Other degenerative diseases of the basal ganglia    S/P CVA 2011--  CHRONIC LEFT BASAL GANGLIA LACUNAR INFARCT  PER CT  . Schizophrenia ([place])    HX PSYCHIATRIC ADMISSION'S  AND ECT ([place]) [place]  . Urinary bladder stone   . Vitamin B12 deficiency     Patient Active Problem List   Diagnosis Date Noted  . Insomnia 12/09/2016  . Constipation 12/07/2016  . Hyperglycemia 09/23/2016  . Dark urine 09/21/2016  . Thyroid nodule 09/21/2016  . Gait instability   . TIA (transient ischemic attack) 12/02/2015  . BPH with urinary obstruction 08/13/2015  . Acute pyelonephritis-culture negative s/p multiple rounds Abx  07/21/2015  . Cystitis 07/20/2015  . Obstructed, uropathy 07/20/2015  . History of CVA (cerebrovascular accident) 07/20/2015  . NSVT (nonsustained ventricular tachycardia) ([place]) 07/20/2015  . Chronic pain syndrome 06/10/2015  . Nausea without vomiting 06/09/2015  . Abnormal breath sounds 12/15/2014  . Parkinson's disease ([place]) 11/25/2014  . Vitamin B 12 deficiency 11/25/2014  . Ataxia 11/23/2014  . Chronic diastolic CHF (congestive heart failure) ([place]) 08/17/2014  . Hypokalemia 08/16/2014  . Weakness generalized 08/15/2014  . Urinary bladder stone 08/12/2014  . Gait disorder 08/12/2014  . Acute upper respiratory infection 12/02/2013  . Chronic nausea 12/02/2013  . Urinary incontinence 12/02/2013  . Calculus of bladder 10/03/2013  . Peripheral edema 06/10/2013  . Dyspepsia 12/24/2012  . Bladder neck obstruction 12/24/2012  . Glucose intolerance (impaired glucose tolerance) 06/27/2012  . Bipolar affective disorder ([place]) 06/27/2012  . Tremor 06/27/2012  . Skin lesion 06/27/2012  . Erectile dysfunction 06/27/2012  . OAB (overactive bladder) 06/27/2012  . Encounter for preventative adult health care exam with abnormal findings 06/22/2012  . Depression 06/22/2012  . GERD (gastroesophageal reflux disease) 06/22/2012  . Fatty liver 06/22/2012  . Chronic headaches 06/22/2012  . Stroke ([place])   . Anxiety   . Skin cancer of nose   . Rectal polyp   . BPH (benign prostatic hyperplasia) 02/01/2012  . Dementia   . HTN (hypertension)   . Vitamin B12  deficiency   . Schizophrenia ([place])   . Hyperlipidemia     Past Surgical History:  Procedure Laterality Date  . CARDIOVASCULAR STRESS TEST  06-18-2013  DR Jenkins [place]   LOW RISK NO EXERCISE LEXISCAN STUDY/  NO ISCHEMIA, CANNOT RULE OUT BASAL INFERIOR INFARCTION BUT POSSIBLE ARTIFACT GIVEN NORMAL WALL MOTION/  EF 73%  . CYSTOSCOPY WITH LITHOLAPAXY N/A 10/03/2013   Procedure: CYSTOSCOPY WITH LITHOLAPAXY;  Surgeon: Claybon Jabs, MD;   Location: [facility];  Service: Urology;  Laterality: N/A;  . CYSTOSCOPY WITH LITHOLAPAXY N/A 08/13/2015   Procedure: CYSTOSCOPY WITH LITHOLAPAXY;  Surgeon: Kathie Rhodes, MD;  Location: [facility];  Service: Urology;  Laterality: N/A;  . EXCISION TUMOR UPPER LEFT THIGH  2012  . HOLMIUM LASER APPLICATION N/A [id number]   Procedure: HOLMIUM LASER APPLICATION;  Surgeon: Claybon Jabs, MD;  Location[facility];  Service: Urology;  Laterality: N/A;  . HOLMIUM LASER APPLICATION N/A [id number]   Procedure: HOLMIUM LASER APPLICATION;  Surgeon: Kathie Rhodes, MD;  Location: [facility];  Service: Urology;  Laterality: N/A;  . TRANSANAL EXCISION ANAL AND RECTAL POLYPS  11-02-2000  . TRANSTHORACIC ECHOCARDIOGRAM  08-16-2014   Mild LVH/ grade I diastolic dysfunction/  ef 60-65%/  mild AV calification without stenosis/  mild RAE  . TRANSURETHRAL INCISION OF PROSTATE N/A 10/03/2013   Procedure: TRANSURETHRAL INCISION OF THE PROSTATE (TUIP);  Surgeon: Claybon Jabs, MD;  Location: [facility];  Service: Urology;  Laterality: N/A;  . TRANSURETHRAL RESECTION OF PROSTATE N/A 08/13/2015   Procedure: TRANSURETHRAL RESECTION OF THE PROSTATE (TURP);  Surgeon: Kathie Rhodes, MD;  Location[facility];  Service: Urology;  Laterality: N/A;       Home Medications    Prior to Admission medications   Medication Sig Start Date End Date Taking? Authorizing Provider  ALPRAZolam Duanne Moron) 0.5 MG tablet TAKE 1 TABLET BY MOUTH THREE TIMES DAILY FOR ANXIETY 12/19/16  Yes Biagio Borg, MD  alum & mag hydroxide-simeth (MAALOX/MYLANTA) 200-200-20 MG/5ML suspension Take 30 mLs by mouth every 6 (six) hours as needed for indigestion or heartburn.   Yes Historical Provider, MD  amLODipine (NORVASC) 5 MG tablet TAKE 1 TABLET BY MOUTH EVERY DAY Patient taking differently: take 5 mg po daily 11/15/16  Yes Biagio Borg, MD  carbidopa-levodopa  (SINEMET IR) 25-100 MG tablet TAKE 1 TABLET BY MOUTH FOUR TIMES DAILY 12/05/16  Yes Biagio Borg, MD  Flaxseed, Linseed, (FLAX SEED OIL) 1000 MG CAPS Take 2,000 mg by mouth daily with breakfast.   Yes Historical Provider, MD  mirtazapine (REMERON) 15 MG tablet Take 15 mg by mouth at bedtime as needed (for sleep).  12/22/16  Yes Historical Provider, MD  Nutritional Supplements (CARNATION INST BREAKFAST JUICE PO) Take 1 Bottle by mouth 2 (two) times daily.   Yes Historical Provider, MD  Omega-3 Fatty Acids (FISH OIL) 1200 MG CAPS Take 3 capsules by mouth daily.   Yes Historical Provider, MD  pantoprazole (PROTONIX) 40 MG tablet Take 1 tablet (40 mg total) by mouth daily. Patient taking differently: Take 40 mg by mouth daily as needed (for acid reflex).  06/09/15  Yes Biagio Borg, MD  Probiotic Product (PROBIOTIC DAILY) CAPS Take 1 capsule by mouth daily.   Yes Historical Provider, MD  prochlorperazine (COMPAZINE) 5 MG tablet Take 5 mg by mouth every 8 (eight) hours as needed for nausea or vomiting.  12/03/16  Yes Historical Provider, MD  tiZANidine (ZANAFLEX) 2 MG tablet Take 1 tablet (2 mg total) by mouth every 8 (eight) hours as needed for muscle spasms. 12/07/16  Yes Biagio Borg, MD  traMADol (ULTRAM) 50 MG tablet TAKE 1 TABLET BY MOUTH EVERY 8 HOURS AS NEEDED Patient taking differently: TAKE 1 TABLET BY MOUTH EVERY 8 HOURS AS NEEDED for pain 12/22/16  Yes Biagio Borg, MD  atorvastatin (LIPITOR) 10 MG tablet Take 1 tablet (10 mg total) by mouth daily. Patient not taking: Reported on 01/01/2017 04/21/16   Biagio Borg, MD  QUEtiapine (SEROQUEL) 50 MG tablet Take 1 tablet (50 mg total) by mouth at bedtime. Patient not taking: Reported on 01/11/2017 12/07/16   Biagio Borg, MD    Family History Family History  Problem Relation Age of Onset  . Dementia Father   . Dementia Mother   . Colon cancer Neg Hx   . Colon polyps Neg Hx   . Rectal cancer Neg Hx   . Stomach cancer Neg Hx     Social  History Social History  Substance Use Topics  . Smoking status: Current Every Day Smoker    Packs/day: 0.10    Years: 48.00    Types: Cigarettes  . Smokeless tobacco: Never Used  . Alcohol use No     Allergies   Patient has no known allergies.   Review of Systems Review of Systems  Reason unable to perform ROS: Limited by dementia.  Constitutional: Negative for fever.  Respiratory: Negative for shortness of breath.   Cardiovascular: Negative for chest pain.  Gastrointestinal: Positive for abdominal pain. Negative for nausea and vomiting.  Genitourinary: Positive for frequency. Negative for dysuria.  Musculoskeletal: Negative for back pain.     Physical Exam Updated Vital Signs BP 139/90 (BP Location: Left Arm)   Pulse 70   Temp 98.3 F (36.8 C) (Oral)   Resp 13   SpO2 95%   Physical Exam  Constitutional: He appears well-developed and well-nourished. No distress.  HENT:  Head: Normocephalic and atraumatic.  Mouth/Throat: Oropharynx is clear and moist. No oropharyngeal exudate.  Eyes: Conjunctivae are normal. Pupils are equal, round, and reactive to light. Right eye exhibits no discharge. Left eye exhibits no discharge. No scleral icterus.  Neck: Normal range of motion. Neck supple. No thyromegaly present.  Cardiovascular: Normal rate, regular rhythm, normal heart sounds and intact distal pulses.  Exam reveals no gallop and no friction rub.   No murmur heard. Pulmonary/Chest: Effort normal and breath sounds normal. No stridor. No respiratory distress. He has no wheezes. He has no rales.  Abdominal: Soft. Bowel sounds are normal. He exhibits no distension. There is tenderness in the suprapubic area. There is no rebound, no guarding and no CVA tenderness.  Musculoskeletal: He exhibits no edema.  Lymphadenopathy:    He has no cervical adenopathy.  Neurological: He is alert. Coordination normal.  Skin: Skin is warm and dry. No rash noted. He is not diaphoretic. No  pallor.  Psychiatric: He has a normal mood and affect.  Nursing note and vitals reviewed.    ED Treatments / Results  Labs (all labs ordered are listed, but only abnormal results are displayed) Labs Reviewed  COMPREHENSIVE METABOLIC PANEL - Abnormal; Notable for the following:       Result Value   Glucose, Bld 124 (*)    Calcium 8.8 (*)    ALT 12 (*)    All other components within normal limits  CBC WITH DIFFERENTIAL/PLATELET - Abnormal; Notable  for the following:    WBC 10.8 (*)    Neutro Abs 9.4 (*)    All other components within normal limits  URINALYSIS, ROUTINE W REFLEX MICROSCOPIC  TROPONIN I  I-STAT CG4 LACTIC ACID, ED    EKG  EKG Interpretation  Date/Time:  Thursday January 11 2017 18:37:07 EST Ventricular Rate:  79 PR Interval:    QRS Duration: 96 QT Interval:  372 QTC Calculation: 427 R Axis:   8 Text Interpretation:  Normal sinus rhythm No significant change since last tracing Confirmed by Winfred Leeds  MD, SAM [phone]) on 01/11/2017 8:27:11 PM       Radiology No results found.  Procedures Procedures (including critical care time)  Medications Ordered in ED Medications - No data to display   Initial Impression / Assessment and Plan / ED Course  I have reviewed the triage vital signs and the nursing notes.  Pertinent labs & imaging results that were available during my care of the patient were reviewed by me and considered in my medical decision making (see chart for details).     Patient with Parkinson's, dementia, schizophrenia presenting with increased confusion and increased urination. Patient is bedridden and has palliative care at home. UA negative. CBC shows 10 CBC 10.8. CMP shows glucose 124, calcium 8.8, ALT 12. Lactate 1.51. Troponin negative. CT head shows no acute intracranial process, atrophy. Considering patient complaining of bilateral shoulder pain, x-rays completed. Right shows glenohumeral joint arthritis; left negative. EKG shows NSR  without change from last tracing. No findings waiting the patient's symptoms, however patient reporting hallucinations to Dr. Winfred Leeds. Dr. Winfred Leeds discussed with wife if she would like him evaluated by psychiatry and she declined. She is comfortable taking the patient home to follow up with PCP. Patient and wife understand and agree with plan. Return precautions discussed. Patient vitals stable throughout ED course and discharged in satisfactory condition. Patient also evaluated with Dr. Winfred Leeds event the patient's management and agrees with plan.  Final Clinical Impressions(s) / ED Diagnoses   Final diagnoses:  Parkinson's disease ([place])  Dementia without behavioral disturbance, unspecified dementia type    New Prescriptions Discharge Medication List as of 01/11/2017  8:23 PM       Frederica Kuster, PA-C 01/15/17 0106    Orlie Dakin, MD 01/15/17 1211

## 2017-01-11 NOTE — ED Provider Notes (Signed)
Level V caveat dementia. History is obtained from patient's wife reports the patient has been hearing voices and telling people to get out of his room that weren't there today. He has history of dementia and schizophrenia. His wife feels that she he is manageable at home. He reportedly fell approximately one month ago. Patient offers no specific complaints. Stating he is presently hungry   Orlie Dakin, MD 01/12/17 0120

## 2017-01-11 NOTE — ED Notes (Signed)
Patient transported to X-ray 

## 2017-01-15 ENCOUNTER — Other Ambulatory Visit: Payer: Self-pay | Admitting: Internal Medicine

## 2017-01-16 ENCOUNTER — Telehealth: Payer: Self-pay | Admitting: Internal Medicine

## 2017-01-16 NOTE — Telephone Encounter (Signed)
Needs office visit to evaluate.  

## 2017-01-16 NOTE — Telephone Encounter (Signed)
States patient has been having a sticky, yellow substance draining from eyes.  Substance is so thick in the mornings he can not open eyes.  Spouse has been using a warm washcloth to clean his eyes in the morning.  Requesting orders for care of patient.

## 2017-01-17 ENCOUNTER — Other Ambulatory Visit: Payer: Self-pay | Admitting: Internal Medicine

## 2017-01-17 MED ORDER — NEOMYCIN-POLYMYXIN-HC OP SUSP: 2.0000 [drp] | Freq: Three times a day (TID) | OPHTHALMIC | 0 refills | Status: AC

## 2017-01-17 NOTE — Telephone Encounter (Signed)
RX sent

## 2017-01-17 NOTE — Telephone Encounter (Signed)
Routing to [place], please advise, thanks 

## 2017-01-17 NOTE — Telephone Encounter (Signed)
Spoke with care giver, pt is bed bound, can not come in for office visit. Please help.

## 2017-01-17 NOTE — Telephone Encounter (Signed)
Spoke with Kenney Houseman, she is aware we sent in rx.

## 2017-01-22 ENCOUNTER — Ambulatory Visit: Payer: Medicare HMO | Admitting: Neurology

## 2017-02-23 ENCOUNTER — Other Ambulatory Visit: Payer: Self-pay | Admitting: Internal Medicine

## 2017-03-12 ENCOUNTER — Telehealth: Payer: Self-pay | Admitting: Internal Medicine

## 2017-03-12 ENCOUNTER — Other Ambulatory Visit: Payer: Self-pay | Admitting: Internal Medicine

## 2017-03-12 NOTE — Telephone Encounter (Signed)
I was aware. When I called daughter earlier I needed clarification on her DOB because the paperwork needs to be scanned into her chart instead of her fathers.

## 2017-03-12 NOTE — Telephone Encounter (Signed)
Daughter Valere Dross  DOB:02/09/65

## 2017-03-12 NOTE — Telephone Encounter (Signed)
Pt wife called regarding paperwork for her daughter to help with pt who is bedridden. The paperwork is for medical leave to help her father.

## 2017-03-13 NOTE — Telephone Encounter (Signed)
faxed

## 2017-03-13 NOTE — Telephone Encounter (Signed)
Done hardcopy to [place]  

## 2017-03-14 NOTE — Telephone Encounter (Signed)
Call wanting status of fax.

## 2017-03-14 NOTE — Telephone Encounter (Signed)
Papers has not been completed yet. Will be completed and faxed by Friday.

## 2017-03-14 NOTE — Telephone Encounter (Signed)
[name] [phone]  Daughter called wanting status of Paperwork   Friday 4/13 is deadline

## 2017-03-15 ENCOUNTER — Telehealth: Payer: Self-pay

## 2017-03-15 DIAGNOSIS — Z0289 Encounter for other administrative examinations: Secondary | ICD-10-CM

## 2017-03-15 NOTE — Telephone Encounter (Signed)
LVM stating that [place] papers had been faxed to employer and a copy was mailed out to her home for her personal records.

## 2017-05-25 ENCOUNTER — Other Ambulatory Visit: Payer: Self-pay | Admitting: Internal Medicine

## 2017-05-25 NOTE — Telephone Encounter (Signed)
Faxed

## 2017-05-25 NOTE — Telephone Encounter (Signed)
Done hardcopy to [place]  

## 2017-06-21 ENCOUNTER — Telehealth: Payer: Self-pay | Admitting: Internal Medicine

## 2017-06-21 NOTE — Telephone Encounter (Signed)
Pt passed away 25-Jun-2017 At 5:55pm at home.

## 2017-06-22 ENCOUNTER — Telehealth: Payer: Self-pay | Admitting: Internal Medicine

## 2017-06-22 NOTE — Telephone Encounter (Signed)
On 06/22/17 [employer] brought a D/C to be signed by Dr. Jenny Reichmann sent D/C to [facility].

## 2017-06-25 ENCOUNTER — Telehealth: Payer: Self-pay | Admitting: Internal Medicine

## 2017-06-25 NOTE — Telephone Encounter (Signed)
06/25/17 Received d/c back and faxed d/c to [employer] then called [employer] to pick up.

## 2017-07-04 DEATH — deceased

## 2017-09-17 ENCOUNTER — Encounter: Payer: Self-pay | Admitting: Gastroenterology

## 2017-10-29 IMAGING — CT CT RENAL STONE PROTOCOL
2 of 3 series · 16 of 46 positions shown, 18 images · non-contrast
Comparison: 08/22/2015

CLINICAL DATA: Worsening bilateral lower extremity weakness over
the last 2 days. Urinary frequency. Discolored urine.

EXAM:
CT ABDOMEN AND PELVIS WITHOUT CONTRAST
TECHNIQUE: Multidetector CT imaging of the abdomen and pelvis was performed
following the standard protocol without IV contrast.

[Series 3: coronal · coronal · 0.74mm/px · 3 of 145 slices shown]
[im 49/145  soft-tissue]
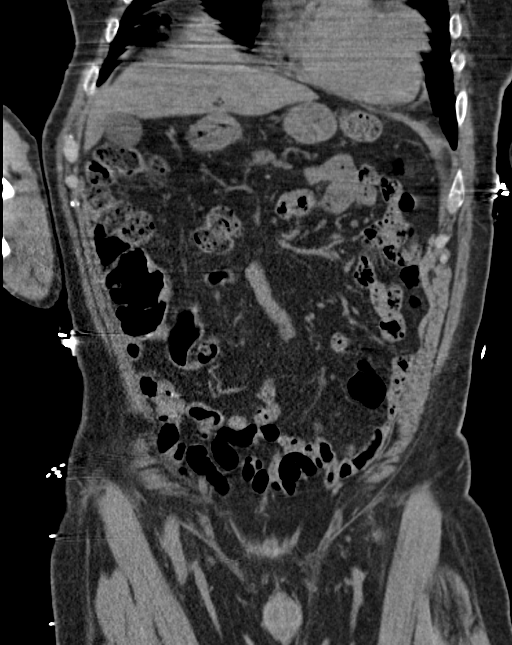
[im 65/145  soft-tissue]
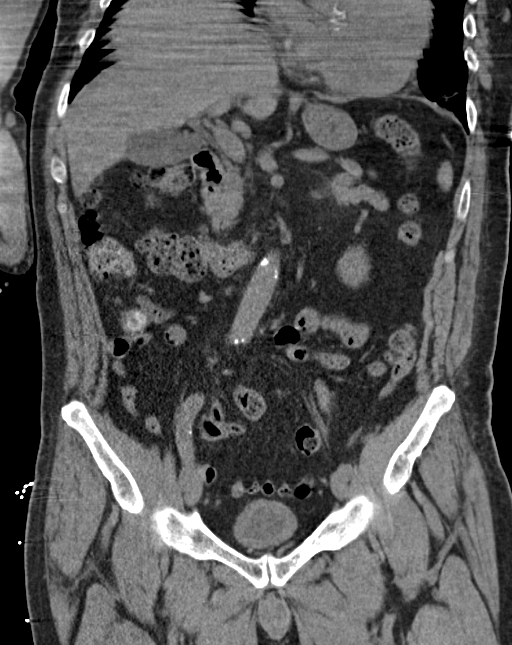
[im 81/145  soft-tissue]
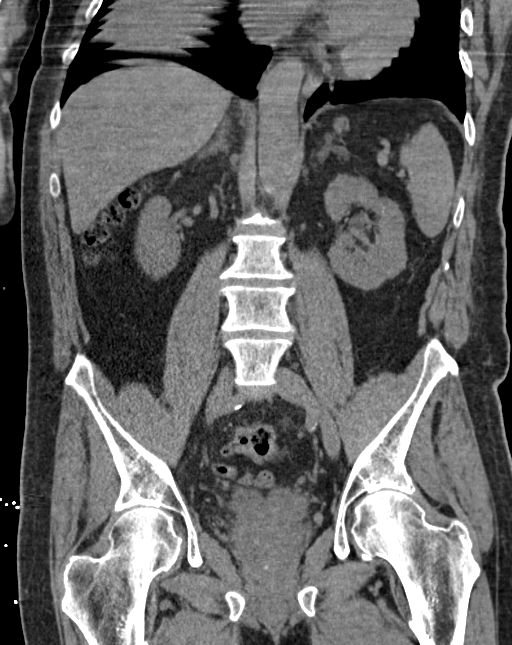

[Series 6: lung · axial · 0.81mm/px · z∈[-136,-22]mm · 13 of 67 slices shown, 15 images]
[im 5/67  soft-tissue]
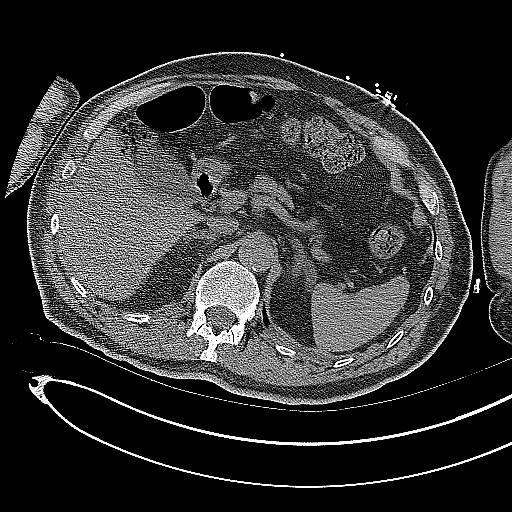
[im 5/67  bone]
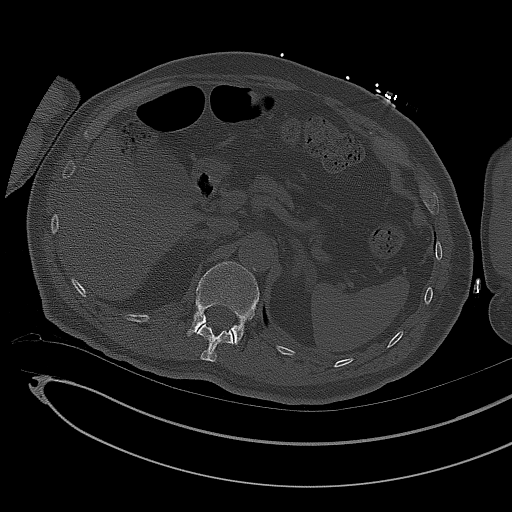
[im 9/67  soft-tissue]
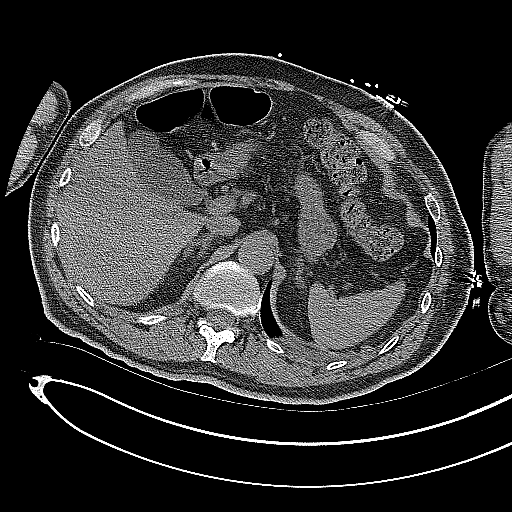
[im 13/67  soft-tissue]
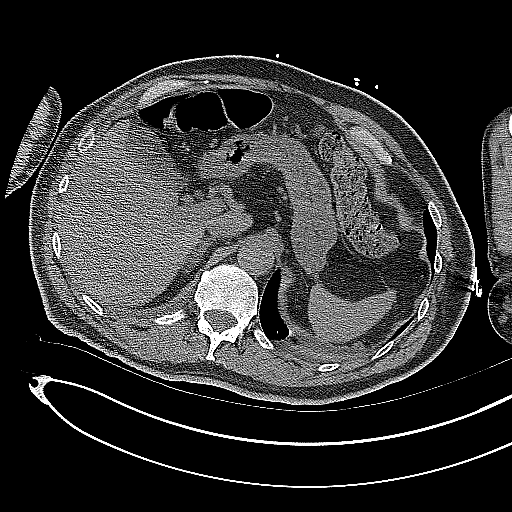
[im 20/67  soft-tissue]
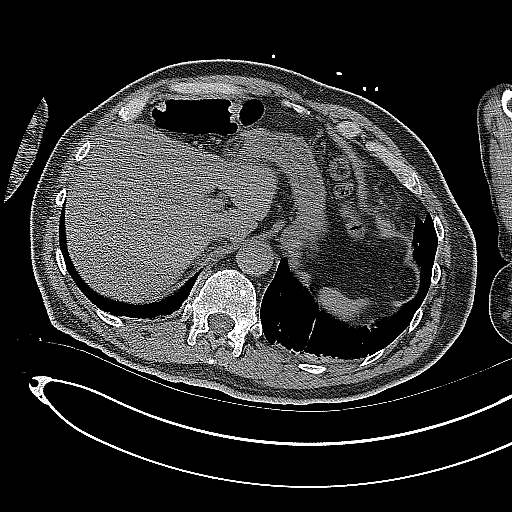
[im 24/67  soft-tissue]
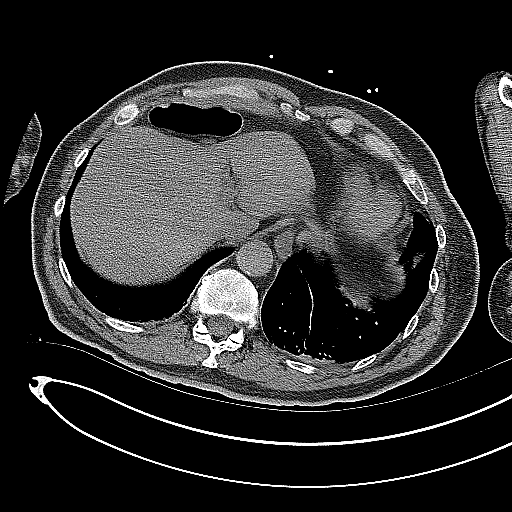
[im 28/67  soft-tissue]
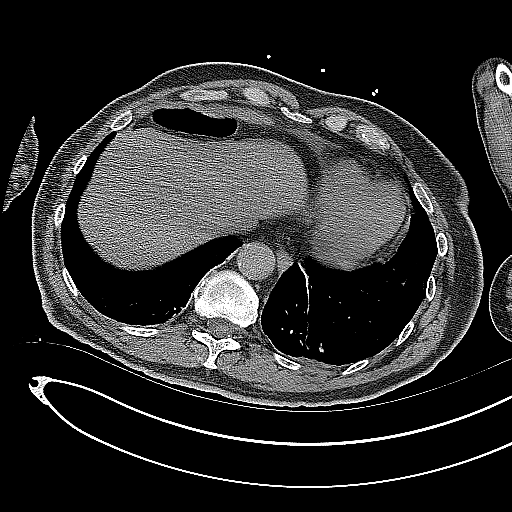
[im 35/67  soft-tissue]
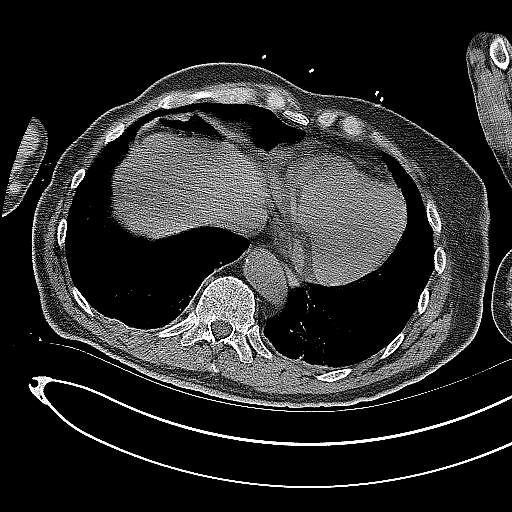
[im 39/67  soft-tissue]
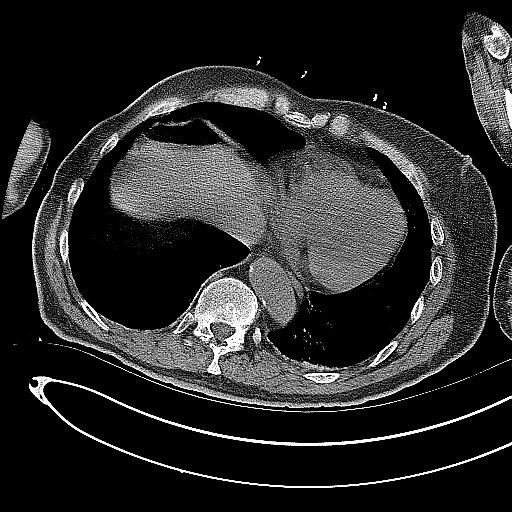
[im 43/67  soft-tissue]
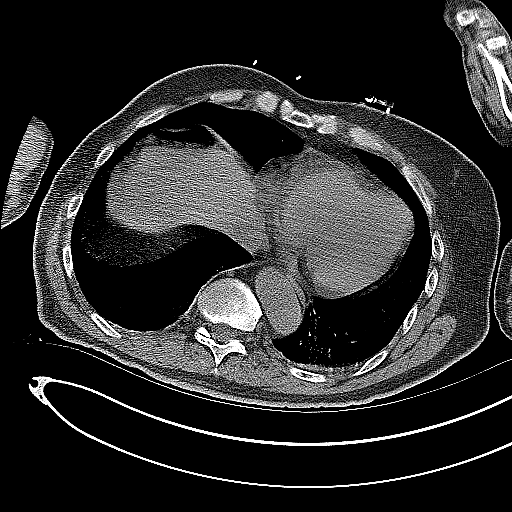
[im 43/67  bone]
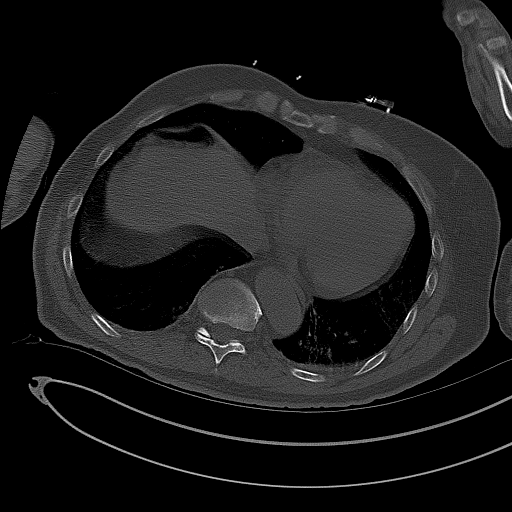
[im 47/67  soft-tissue]
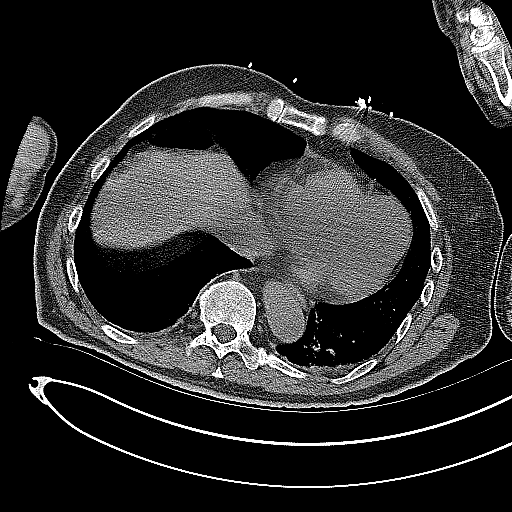
[im 54/67  soft-tissue]
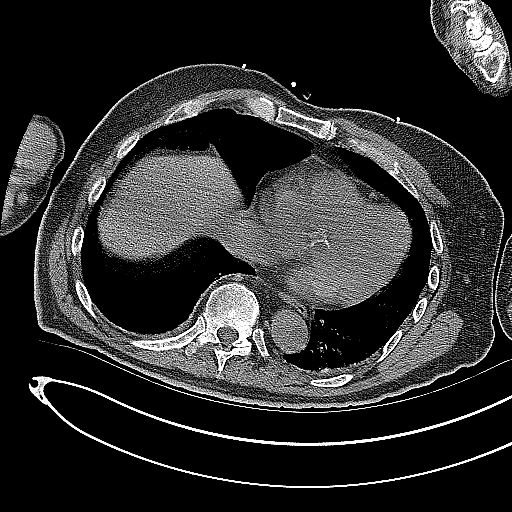
[im 58/67  soft-tissue]
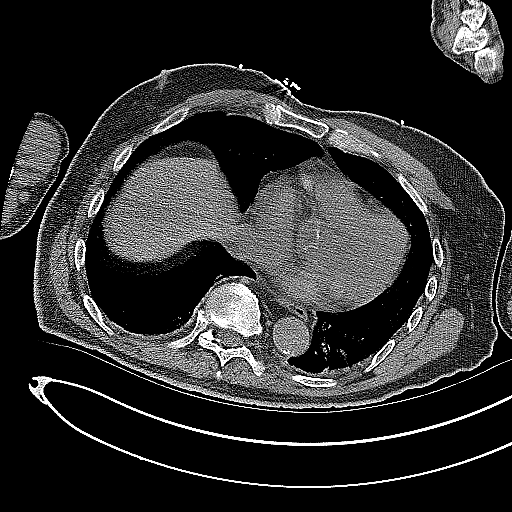
[im 62/67  soft-tissue]
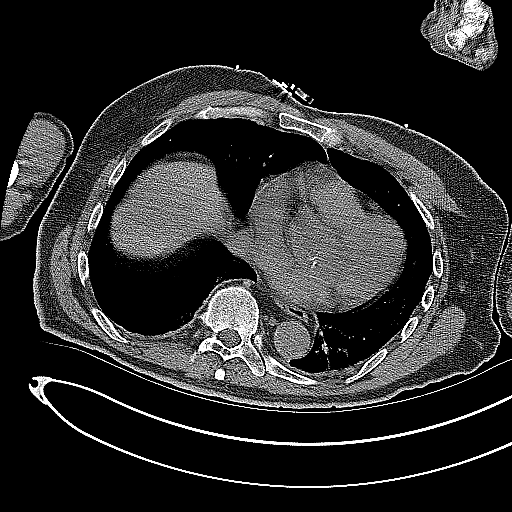

[16 of 46 positions shown; findings below may reference images not displayed]

FINDINGS: Lower chest: Some linear atelectasis and/or scarring in the left
lower lobe.

Hepatobiliary: Normal without contrast.

Pancreas: Normal

Spleen: Normal

Adrenals/Urinary Tract: Adrenal hyperplasia bilaterally. No focal
adrenal mass. Kidneys are normal size bilaterally without evidence
of obstruction. No evidence of renal stone disease. No mass or cyst.
No abnormality along the course of either ureter. Bladder appears
normal for age.

Stomach/Bowel: Moderate amount of stool within the colon,
particularly in the rectal vault. No evidence of small bowel
obstruction. No acute inflammatory change.

Vascular/Lymphatic: Aortic atherosclerosis. No aneurysm. IVC is
normal. No retroperitoneal mass or lymphadenopathy.

Reproductive: Negative.  Prostate gland normal size.

Other: No free fluid or air.

Musculoskeletal: Negative.  Lumbar spine appears normal for age.
IMPRESSION: No evidence of urinary tract pathology on this noncontrast study.

Mild linear atelectasis or scarring at the left lung base.

Aortic atherosclerosis without aneurysm.

Moderate amount of fecal matter throughout the colon, particularly
in the rectal vault.

## 2017-10-29 IMAGING — CR DG CHEST 2V
2 series · 2 of 2 positions shown · non-contrast
Comparison: September 29, 2016

CLINICAL DATA: Worsening cough and bilateral extremity weakness.

EXAM:
CHEST  2 VIEW

[w chest lat]
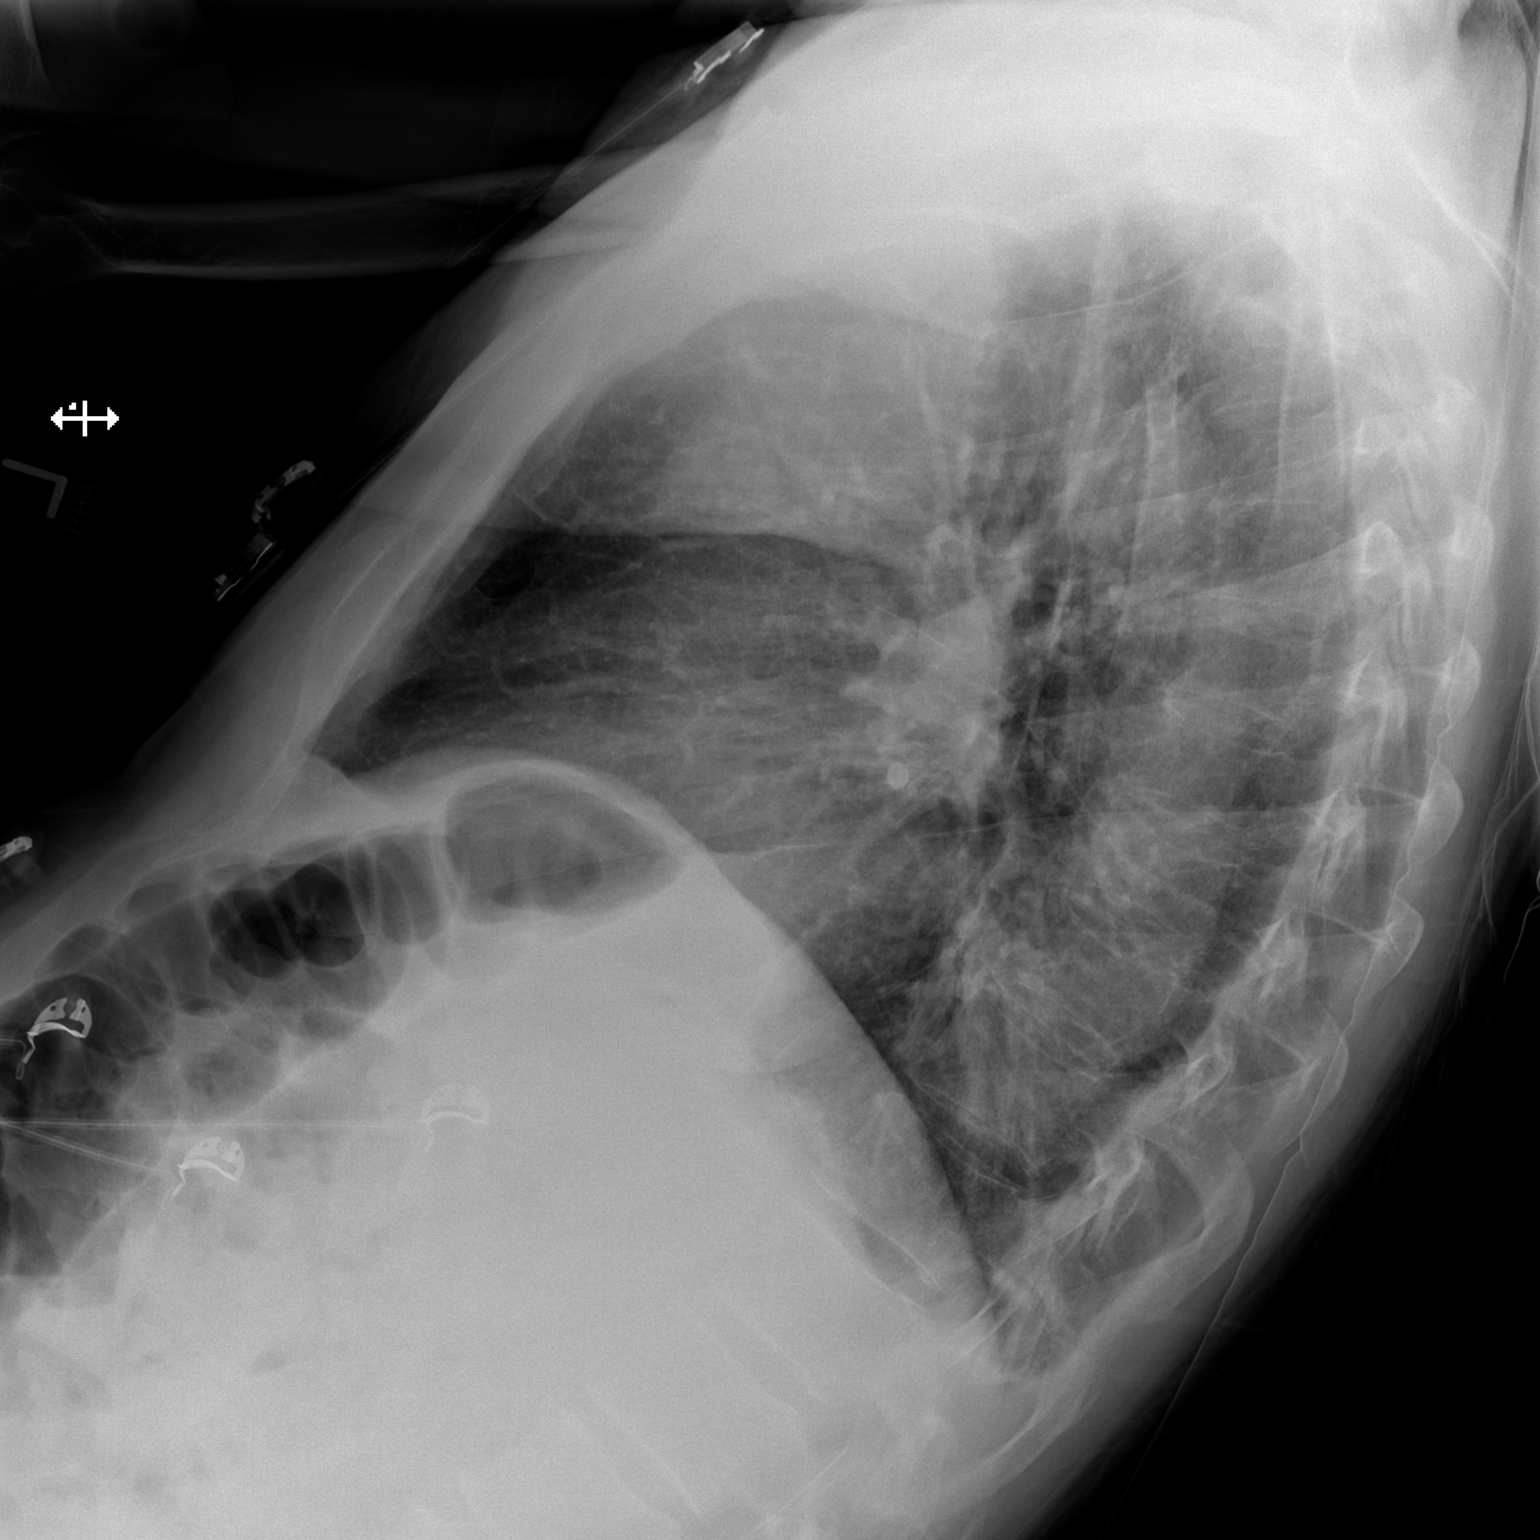

[x chest ap]
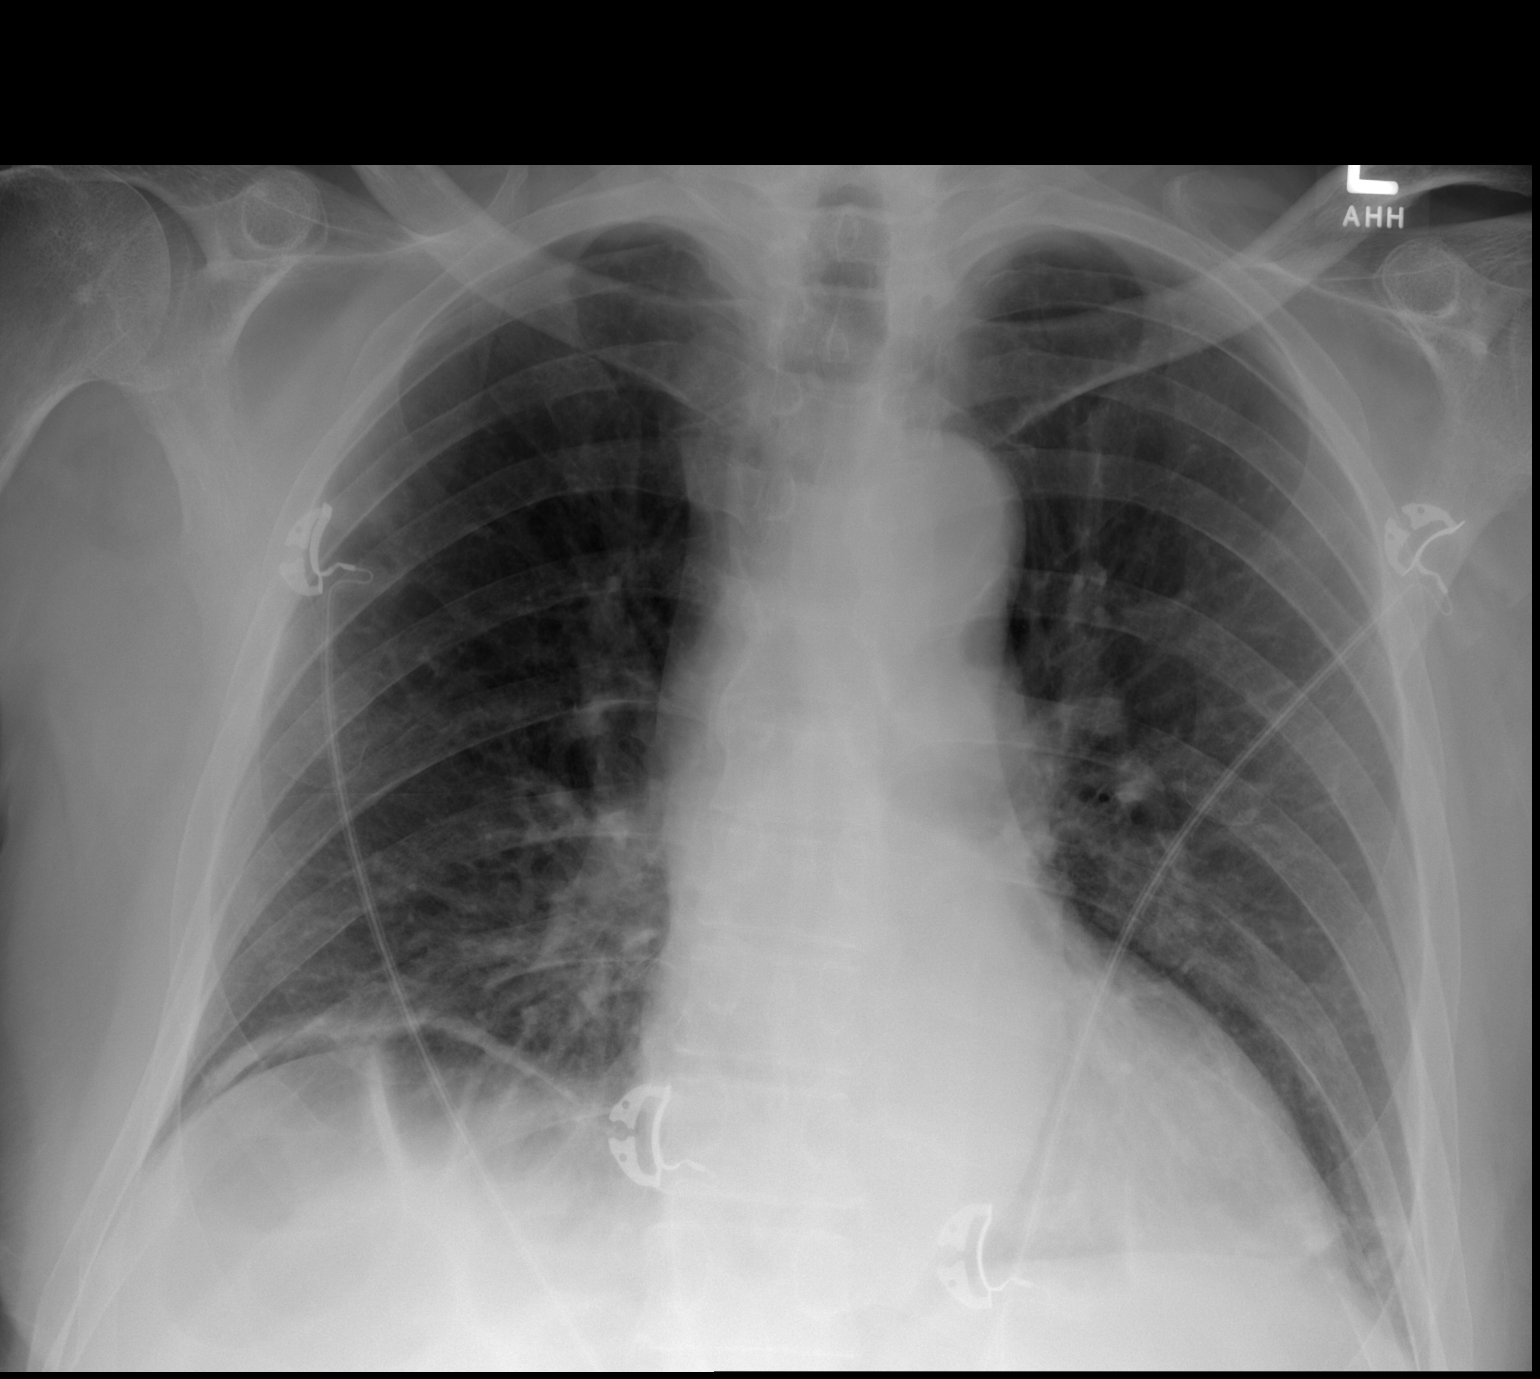

[2 of 2 positions shown; findings below may reference images not displayed]

FINDINGS: The heart size and mediastinal contours are within normal limits.
The aorta is tortuous. There is no focal infiltrate, pulmonary
edema. There are minimal bilateral posterior pleural effusions.
There is air beneath the right hemidiaphragm suspicious for free
air. The visualized skeletal structures are unremarkable.
IMPRESSION: Air beneath the right hemidiaphragm suspicious for free air.

These results will be called to the ordering clinician or
representative by the Radiologist Assistant, and communication
documented in the PACS or zVision Dashboard.

## 2019-03-12 NOTE — Telephone Encounter (Signed)
error 

## 2022-04-03 DEATH — deceased
